# Patient Record
Sex: Female | Born: 1935 | Race: White | Hispanic: No | Smoking: Never smoker
Health system: Southern US, Community
[De-identification: ages and names within clinical notes are randomized; demographics above are authoritative.]

## PROBLEM LIST (undated history)

## (undated) DIAGNOSIS — R238 Other skin changes: Secondary | ICD-10-CM

## (undated) DIAGNOSIS — R197 Diarrhea, unspecified: Secondary | ICD-10-CM

## (undated) DIAGNOSIS — E46 Unspecified protein-calorie malnutrition: Secondary | ICD-10-CM

## (undated) DIAGNOSIS — R630 Anorexia: Secondary | ICD-10-CM

## (undated) DIAGNOSIS — R233 Spontaneous ecchymoses: Secondary | ICD-10-CM

## (undated) DIAGNOSIS — I480 Paroxysmal atrial fibrillation: Secondary | ICD-10-CM

## (undated) DIAGNOSIS — G7241 Inclusion body myositis [IBM]: Secondary | ICD-10-CM

## (undated) DIAGNOSIS — E039 Hypothyroidism, unspecified: Secondary | ICD-10-CM

## (undated) DIAGNOSIS — IMO0002 Reserved for concepts with insufficient information to code with codable children: Secondary | ICD-10-CM

## (undated) DIAGNOSIS — M1990 Unspecified osteoarthritis, unspecified site: Secondary | ICD-10-CM

## (undated) DIAGNOSIS — N390 Urinary tract infection, site not specified: Secondary | ICD-10-CM

## (undated) DIAGNOSIS — R51 Headache: Secondary | ICD-10-CM

## (undated) DIAGNOSIS — I951 Orthostatic hypotension: Secondary | ICD-10-CM

## (undated) DIAGNOSIS — D7589 Other specified diseases of blood and blood-forming organs: Secondary | ICD-10-CM

## (undated) DIAGNOSIS — Z8659 Personal history of other mental and behavioral disorders: Secondary | ICD-10-CM

## (undated) HISTORY — PX: TOTAL HIP ARTHROPLASTY: SHX124

## (undated) HISTORY — DX: Other skin changes: R23.8

## (undated) HISTORY — DX: Inclusion body myositis (IBM): G72.41

## (undated) HISTORY — DX: Personal history of other mental and behavioral disorders: Z86.59

## (undated) HISTORY — DX: Urinary tract infection, site not specified: N39.0

## (undated) HISTORY — DX: Hypothyroidism, unspecified: E03.9

## (undated) HISTORY — PX: PARTIAL HYSTERECTOMY: SHX80

## (undated) HISTORY — DX: Unspecified osteoarthritis, unspecified site: M19.90

## (undated) HISTORY — DX: Orthostatic hypotension: I95.1

## (undated) HISTORY — DX: Headache: R51

## (undated) HISTORY — DX: Anorexia: R63.0

## (undated) HISTORY — DX: Spontaneous ecchymoses: R23.3

---

## 1994-07-08 HISTORY — PX: OTHER SURGICAL HISTORY: SHX169

## 1998-12-28 ENCOUNTER — Encounter: Payer: Self-pay | Admitting: General Surgery

## 1998-12-28 ENCOUNTER — Ambulatory Visit (HOSPITAL_COMMUNITY): Admission: RE | Admit: 1998-12-28 | Discharge: 1998-12-28 | Payer: Self-pay | Admitting: General Surgery

## 1999-10-23 ENCOUNTER — Ambulatory Visit: Admission: RE | Admit: 1999-10-23 | Discharge: 1999-10-23 | Payer: Self-pay | Admitting: Orthopedic Surgery

## 1999-10-24 ENCOUNTER — Encounter: Payer: Self-pay | Admitting: Orthopedic Surgery

## 1999-11-09 ENCOUNTER — Ambulatory Visit (HOSPITAL_COMMUNITY): Admission: RE | Admit: 1999-11-09 | Discharge: 1999-11-09 | Payer: Self-pay | Admitting: Family Medicine

## 1999-12-07 ENCOUNTER — Inpatient Hospital Stay (HOSPITAL_COMMUNITY): Admission: RE | Admit: 1999-12-07 | Discharge: 1999-12-12 | Payer: Self-pay | Admitting: Orthopedic Surgery

## 1999-12-07 ENCOUNTER — Encounter: Payer: Self-pay | Admitting: Orthopedic Surgery

## 2000-05-28 ENCOUNTER — Encounter: Admission: RE | Admit: 2000-05-28 | Discharge: 2000-05-28 | Payer: Self-pay

## 2000-06-05 ENCOUNTER — Other Ambulatory Visit: Admission: RE | Admit: 2000-06-05 | Discharge: 2000-06-05 | Payer: Self-pay | Admitting: Obstetrics & Gynecology

## 2001-06-25 ENCOUNTER — Other Ambulatory Visit: Admission: RE | Admit: 2001-06-25 | Discharge: 2001-06-25 | Payer: Self-pay | Admitting: Obstetrics & Gynecology

## 2001-08-21 ENCOUNTER — Encounter: Payer: Self-pay | Admitting: Gastroenterology

## 2001-08-21 ENCOUNTER — Ambulatory Visit (HOSPITAL_COMMUNITY): Admission: RE | Admit: 2001-08-21 | Discharge: 2001-08-21 | Payer: Self-pay | Admitting: Gastroenterology

## 2001-09-01 ENCOUNTER — Encounter (INDEPENDENT_AMBULATORY_CARE_PROVIDER_SITE_OTHER): Payer: Self-pay | Admitting: Specialist

## 2001-09-01 ENCOUNTER — Ambulatory Visit (HOSPITAL_COMMUNITY): Admission: RE | Admit: 2001-09-01 | Discharge: 2001-09-01 | Payer: Self-pay | Admitting: Gastroenterology

## 2001-11-17 ENCOUNTER — Encounter: Admission: RE | Admit: 2001-11-17 | Discharge: 2001-11-17 | Payer: Self-pay | Admitting: Gastroenterology

## 2001-11-17 ENCOUNTER — Encounter: Payer: Self-pay | Admitting: Gastroenterology

## 2002-05-13 ENCOUNTER — Encounter: Admission: RE | Admit: 2002-05-13 | Discharge: 2002-05-13 | Payer: Self-pay | Admitting: Internal Medicine

## 2002-05-13 ENCOUNTER — Encounter: Payer: Self-pay | Admitting: Internal Medicine

## 2002-07-23 ENCOUNTER — Other Ambulatory Visit: Admission: RE | Admit: 2002-07-23 | Discharge: 2002-07-23 | Payer: Self-pay | Admitting: Obstetrics & Gynecology

## 2002-08-17 ENCOUNTER — Ambulatory Visit (HOSPITAL_COMMUNITY): Admission: RE | Admit: 2002-08-17 | Discharge: 2002-08-17 | Payer: Self-pay | Admitting: Obstetrics & Gynecology

## 2002-08-17 ENCOUNTER — Encounter: Payer: Self-pay | Admitting: Obstetrics & Gynecology

## 2002-09-01 ENCOUNTER — Observation Stay (HOSPITAL_COMMUNITY): Admission: RE | Admit: 2002-09-01 | Discharge: 2002-09-02 | Payer: Self-pay | Admitting: Obstetrics & Gynecology

## 2002-09-01 ENCOUNTER — Encounter (INDEPENDENT_AMBULATORY_CARE_PROVIDER_SITE_OTHER): Payer: Self-pay

## 2002-09-01 ENCOUNTER — Encounter (INDEPENDENT_AMBULATORY_CARE_PROVIDER_SITE_OTHER): Payer: Self-pay | Admitting: Specialist

## 2002-09-30 ENCOUNTER — Other Ambulatory Visit: Admission: RE | Admit: 2002-09-30 | Discharge: 2002-09-30 | Payer: Self-pay | Admitting: Obstetrics & Gynecology

## 2005-03-19 ENCOUNTER — Encounter (HOSPITAL_COMMUNITY): Admission: RE | Admit: 2005-03-19 | Discharge: 2005-04-05 | Payer: Self-pay | Admitting: Internal Medicine

## 2005-04-15 ENCOUNTER — Ambulatory Visit (HOSPITAL_COMMUNITY): Admission: RE | Admit: 2005-04-15 | Discharge: 2005-04-15 | Payer: Self-pay | Admitting: Internal Medicine

## 2005-04-15 ENCOUNTER — Encounter (HOSPITAL_COMMUNITY): Admission: RE | Admit: 2005-04-15 | Discharge: 2005-05-15 | Payer: Self-pay | Admitting: Internal Medicine

## 2005-05-10 ENCOUNTER — Encounter: Admission: RE | Admit: 2005-05-10 | Discharge: 2005-05-10 | Payer: Self-pay | Admitting: Internal Medicine

## 2005-12-18 ENCOUNTER — Encounter: Admission: RE | Admit: 2005-12-18 | Discharge: 2005-12-18 | Payer: Self-pay | Admitting: Family Medicine

## 2006-07-21 ENCOUNTER — Encounter: Admission: RE | Admit: 2006-07-21 | Discharge: 2006-07-21 | Payer: Self-pay | Admitting: Family Medicine

## 2007-02-06 ENCOUNTER — Encounter: Admission: RE | Admit: 2007-02-06 | Discharge: 2007-02-06 | Payer: Self-pay | Admitting: Family Medicine

## 2007-09-21 ENCOUNTER — Encounter: Admission: RE | Admit: 2007-09-21 | Discharge: 2007-09-21 | Payer: Self-pay | Admitting: Family Medicine

## 2007-09-26 ENCOUNTER — Encounter: Admission: RE | Admit: 2007-09-26 | Discharge: 2007-09-26 | Payer: Self-pay

## 2007-10-01 ENCOUNTER — Encounter: Admission: RE | Admit: 2007-10-01 | Discharge: 2007-10-01 | Payer: Self-pay | Admitting: Family Medicine

## 2008-03-31 ENCOUNTER — Encounter: Admission: RE | Admit: 2008-03-31 | Discharge: 2008-03-31 | Payer: Self-pay | Admitting: General Surgery

## 2008-04-01 ENCOUNTER — Encounter: Admission: RE | Admit: 2008-04-01 | Discharge: 2008-04-01 | Payer: Self-pay | Admitting: General Surgery

## 2008-04-04 ENCOUNTER — Encounter (INDEPENDENT_AMBULATORY_CARE_PROVIDER_SITE_OTHER): Payer: Self-pay | Admitting: General Surgery

## 2008-04-04 ENCOUNTER — Ambulatory Visit (HOSPITAL_BASED_OUTPATIENT_CLINIC_OR_DEPARTMENT_OTHER): Admission: RE | Admit: 2008-04-04 | Discharge: 2008-04-04 | Payer: Self-pay | Admitting: General Surgery

## 2008-06-17 ENCOUNTER — Encounter: Admission: RE | Admit: 2008-06-17 | Discharge: 2008-07-07 | Payer: Self-pay | Admitting: Internal Medicine

## 2008-06-20 ENCOUNTER — Encounter: Admission: RE | Admit: 2008-06-20 | Discharge: 2008-07-07 | Payer: Self-pay | Admitting: Internal Medicine

## 2009-03-21 ENCOUNTER — Encounter: Admission: RE | Admit: 2009-03-21 | Discharge: 2009-03-21 | Payer: Self-pay | Admitting: Family Medicine

## 2010-04-19 ENCOUNTER — Encounter: Admission: RE | Admit: 2010-04-19 | Discharge: 2010-04-19 | Payer: Self-pay | Admitting: Family Medicine

## 2010-07-04 ENCOUNTER — Inpatient Hospital Stay (HOSPITAL_COMMUNITY)
Admission: EM | Admit: 2010-07-04 | Discharge: 2010-07-13 | Disposition: A | Discharge status: Other Acute Inpatient Hospital | Payer: Self-pay | Source: Home / Self Care | Attending: Internal Medicine | Admitting: Internal Medicine

## 2010-07-07 ENCOUNTER — Encounter: Payer: Self-pay | Admitting: Internal Medicine

## 2010-07-08 HISTORY — PX: TRANSTHORACIC ECHOCARDIOGRAM: SHX275

## 2010-07-11 LAB — GLUCOSE, CAPILLARY
Glucose-Capillary: 109 mg/dL — ABNORMAL HIGH (ref 70–99)
Glucose-Capillary: 116 mg/dL — ABNORMAL HIGH (ref 70–99)
Glucose-Capillary: 124 mg/dL — ABNORMAL HIGH (ref 70–99)
Glucose-Capillary: 137 mg/dL — ABNORMAL HIGH (ref 70–99)
Glucose-Capillary: 81 mg/dL (ref 70–99)

## 2010-07-11 LAB — BASIC METABOLIC PANEL
BUN: 14 mg/dL (ref 6–23)
CO2: 26 mEq/L (ref 19–32)
Calcium: 9 mg/dL (ref 8.4–10.5)
Chloride: 109 mEq/L (ref 96–112)
Creatinine, Ser: 0.79 mg/dL (ref 0.4–1.2)
GFR calc Af Amer: >60 mL/min (ref >60)
GFR calc non Af Amer: >60 mL/min (ref >60)
Glucose, Bld: 88 mg/dL (ref 70–99)
Potassium: 3.5 mEq/L (ref 3.5–5.1)
Sodium: 141 mEq/L (ref 135–145)

## 2010-07-28 ENCOUNTER — Emergency Department (HOSPITAL_COMMUNITY)
Admission: EM | Admit: 2010-07-28 | Discharge: 2010-07-28 | Payer: Self-pay | Source: Home / Self Care | Admitting: Emergency Medicine

## 2010-07-28 ENCOUNTER — Encounter: Payer: Self-pay | Admitting: Internal Medicine

## 2010-07-28 ENCOUNTER — Encounter: Payer: Self-pay | Admitting: Family Medicine

## 2010-07-29 ENCOUNTER — Encounter: Payer: Self-pay | Admitting: Family Medicine

## 2010-07-31 LAB — POCT I-STAT, CHEM 8
BUN: 34 mg/dL — ABNORMAL HIGH (ref 6–23)
Hemoglobin: 10.2 g/dL — ABNORMAL LOW (ref 12.0–15.0)
TCO2: 28 mmol/L (ref 0–100)

## 2010-07-31 LAB — CBC
Hemoglobin: 10.1 g/dL — ABNORMAL LOW (ref 12.0–15.0)
MCH: 31.7 pg (ref 26.0–34.0)
MCV: 95.6 fL (ref 78.0–100.0)
RBC: 3.19 MIL/uL — ABNORMAL LOW (ref 3.87–5.11)
RDW: 13.1 % (ref 11.5–15.5)
WBC: 6.4 10*3/uL (ref 4.0–10.5)

## 2010-07-31 LAB — PROTIME-INR: Prothrombin Time: 13.1 seconds (ref 11.6–15.2)

## 2010-07-31 LAB — APTT: aPTT: 30 seconds (ref 24–37)

## 2010-08-09 NOTE — Procedures (Signed)
Summary: Upper Endoscopy w/DIL  Patient: [name] Note: All result statuses are Final unless otherwise noted.  Tests: (1) Upper Endoscopy w/DIL (UED)  UED Upper Endoscopy w/DIL                             DONE     [facility]     [address] [place], [place]  [address]           ENDOSCOPY PROCEDURE REPORT           PATIENT:  [name], [name]  MR#:  [id number]     BIRTHDATE:  Dec 03, 1925, 84 yrs. old  GENDER:  female           ENDOSCOPIST:  Hedwig Morton. Juanda Chance, MD     ASSISTANT:           PROCEDURE DATE:  07/07/2010     PROCEDURE:  EGD with dilatation over guidewire     ASA CLASS:  Class III     INDICATIONS:  1) dysphagia abnormal Ba esophagram, 12.5 mm tablet     retained at g-e junction, also dismotility           MEDICATIONS:   Versed 3 mg, Fentanyl 37.5 mcg     TOPICAL ANESTHETIC:  Cetacaine Spray           DESCRIPTION OF PROCEDURE:   After the risks benefits and     alternatives of the procedure were thoroughly explained, informed     consent was obtained.  The Pentax EGD [id number] endoscope was     introduced through the mouth and advanced to the second portion of     the duodenum, without limitations.  The instrument was slowly     withdrawn as the mucosa was carefully examined.     <<PROCEDUREIMAGES>>           A hiatal hernia was found (see image5, image6, image7, and     image4). 3 cm hiatal hernia 37-40cm  Presbyesophagus was found     (see image10 and image11).  The examination was otherwise normal.     Savary dilation over a guidewire was performed (see image9,     image2, and image1). 18 mm Savary dil passes through diaphragmatic     ring without difficulty    Dilation was then performed at the     distal esophagus           1) Dilator:  Savary over guidewire  Size(s):     Resistance:    Heme:     Appearance:           COMPLICATIONS:  None           ENDOSCOPIC IMPRESSION:     1) Hiatal hernia     2) Presbyesophagus     3) Otherwise normal  examination.     no stricture, esophageal dismotility and a spasm at the     diaphragmatic hiatus     s/p passage of 18 mm dilator     RECOMMENDATIONS:     resume diet     antireflux measures     antacids prn           REPEAT EXAM:  In 0 year(s) for.           ______________________________     Hedwig Morton. Juanda Chance, MD           CC:  n.     eSIGNED:   Hedwig Morton. Brodie at 07/07/2010 10:34 AM           Page 2 of 3   [place], [place], [id number]  Note: An exclamation mark (!) indicates a result that was not dispersed into the flowsheet. Document Creation Date: 07/07/2010 10:35 AM _______________________________________________________________________  (1) Order result status: Final Collection or observation date-time: 07/07/2010 10:21 Requested date-time:  Receipt date-time:  Reported date-time:  Referring Physician:   Ordering Physician: Lina Sar [phone]) Specimen Source:  Source: Launa Grill Order Number: [id number] Lab site:

## 2010-08-10 ENCOUNTER — Ambulatory Visit (INDEPENDENT_AMBULATORY_CARE_PROVIDER_SITE_OTHER): Payer: Medicare Other | Admitting: *Deleted

## 2010-08-10 DIAGNOSIS — I4891 Unspecified atrial fibrillation: Secondary | ICD-10-CM

## 2010-08-10 DIAGNOSIS — E039 Hypothyroidism, unspecified: Secondary | ICD-10-CM

## 2010-08-10 DIAGNOSIS — D649 Anemia, unspecified: Secondary | ICD-10-CM

## 2010-09-17 LAB — COMPREHENSIVE METABOLIC PANEL
ALT: 10 U/L (ref 0–35)
AST: 22 U/L (ref 0–37)
Albumin: 3.2 g/dL — ABNORMAL LOW (ref 3.5–5.2)
Alkaline Phosphatase: 47 U/L (ref 39–117)
Chloride: 100 mEq/L (ref 96–112)
Creatinine, Ser: 1 mg/dL (ref 0.4–1.2)
GFR calc Af Amer: >60 mL/min (ref >60)
Potassium: 3.2 mEq/L — ABNORMAL LOW (ref 3.5–5.1)
Sodium: 136 mEq/L (ref 135–145)
Total Bilirubin: 0.6 mg/dL (ref 0.3–1.2)

## 2010-09-17 LAB — CBC
HCT: 29.8 % — ABNORMAL LOW (ref 36.0–46.0)
Hemoglobin: 9.9 g/dL — ABNORMAL LOW (ref 12.0–15.0)
MCH: 32.9 pg (ref 26.0–34.0)
MCHC: 33.6 g/dL (ref 30.0–36.0)
MCV: 98 fL (ref 78.0–100.0)
Platelets: 166 10*3/uL (ref 150–400)
Platelets: 199 10*3/uL (ref 150–400)
Platelets: 224 10*3/uL (ref 150–400)
RBC: 2.95 MIL/uL — ABNORMAL LOW (ref 3.87–5.11)
RBC: 3.04 MIL/uL — ABNORMAL LOW (ref 3.87–5.11)
RBC: 3.68 MIL/uL — ABNORMAL LOW (ref 3.87–5.11)
RBC: 4.02 MIL/uL (ref 3.87–5.11)
RDW: 15.7 % — ABNORMAL HIGH (ref 11.5–15.5)
RDW: 16.1 % — ABNORMAL HIGH (ref 11.5–15.5)
WBC: 3.6 10*3/uL — ABNORMAL LOW (ref 4.0–10.5)
WBC: 3.7 10*3/uL — ABNORMAL LOW (ref 4.0–10.5)

## 2010-09-17 LAB — GLUCOSE, CAPILLARY
Glucose-Capillary: 106 mg/dL — ABNORMAL HIGH (ref 70–99)
Glucose-Capillary: 109 mg/dL — ABNORMAL HIGH (ref 70–99)
Glucose-Capillary: 166 mg/dL — ABNORMAL HIGH (ref 70–99)
Glucose-Capillary: 81 mg/dL (ref 70–99)
Glucose-Capillary: 83 mg/dL (ref 70–99)
Glucose-Capillary: 84 mg/dL (ref 70–99)
Glucose-Capillary: 86 mg/dL (ref 70–99)
Glucose-Capillary: 91 mg/dL (ref 70–99)
Glucose-Capillary: 94 mg/dL (ref 70–99)
Glucose-Capillary: 94 mg/dL (ref 70–99)
Glucose-Capillary: 94 mg/dL (ref 70–99)
Glucose-Capillary: 97 mg/dL (ref 70–99)

## 2010-09-17 LAB — CARDIAC PANEL(CRET KIN+CKTOT+MB+TROPI)
CK, MB: 4.4 ng/mL — ABNORMAL HIGH (ref 0.3–4.0)
CK, MB: 5 ng/mL — ABNORMAL HIGH (ref 0.3–4.0)
Relative Index: 3 — ABNORMAL HIGH (ref 0.0–2.5)
Relative Index: 3 — ABNORMAL HIGH (ref 0.0–2.5)
Relative Index: 3 — ABNORMAL HIGH (ref 0.0–2.5)
Total CK: 142 U/L (ref 7–177)
Total CK: 167 U/L (ref 7–177)
Troponin I: 0.03 ng/mL (ref 0.00–0.06)
Troponin I: 0.03 ng/mL (ref 0.00–0.06)

## 2010-09-17 LAB — LIPID PANEL
Cholesterol: 415 mg/dL — ABNORMAL HIGH (ref 0–200)
HDL: 72 mg/dL (ref >39)
Total CHOL/HDL Ratio: 5.8 RATIO

## 2010-09-17 LAB — URINALYSIS, ROUTINE W REFLEX MICROSCOPIC
Bilirubin Urine: NEGATIVE
Glucose, UA: NEGATIVE mg/dL
Nitrite: POSITIVE — AB
Protein, ur: NEGATIVE mg/dL
Urobilinogen, UA: 1 mg/dL (ref 0.0–1.0)
pH: 7 (ref 5.0–8.0)

## 2010-09-17 LAB — DIFFERENTIAL
Basophils Absolute: 0 10*3/uL (ref 0.0–0.1)
Basophils Relative: 1 % (ref 0–1)
Eosinophils Absolute: 0.1 10*3/uL (ref 0.0–0.7)
Neutro Abs: 4.2 10*3/uL (ref 1.7–7.7)

## 2010-09-17 LAB — BASIC METABOLIC PANEL
BUN: 11 mg/dL (ref 6–23)
BUN: 12 mg/dL (ref 6–23)
BUN: 13 mg/dL (ref 6–23)
BUN: 8 mg/dL (ref 6–23)
CO2: 22 mEq/L (ref 19–32)
CO2: 24 mEq/L (ref 19–32)
CO2: 26 mEq/L (ref 19–32)
CO2: 29 mEq/L (ref 19–32)
Calcium: 8.4 mg/dL (ref 8.4–10.5)
Calcium: 8.7 mg/dL (ref 8.4–10.5)
Calcium: 9 mg/dL (ref 8.4–10.5)
Chloride: 100 mEq/L (ref 96–112)
Chloride: 108 mEq/L (ref 96–112)
Creatinine, Ser: 0.86 mg/dL (ref 0.4–1.2)
Creatinine, Ser: 0.95 mg/dL (ref 0.4–1.2)
GFR calc Af Amer: 54 mL/min — ABNORMAL LOW (ref >60)
GFR calc Af Amer: >60 mL/min (ref >60)
GFR calc Af Amer: >60 mL/min (ref >60)
GFR calc non Af Amer: 51 mL/min — ABNORMAL LOW (ref >60)
GFR calc non Af Amer: >60 mL/min (ref >60)
GFR calc non Af Amer: >60 mL/min (ref >60)
Glucose, Bld: 75 mg/dL (ref 70–99)
Glucose, Bld: 81 mg/dL (ref 70–99)
Glucose, Bld: 86 mg/dL (ref 70–99)
Potassium: 3.2 mEq/L — ABNORMAL LOW (ref 3.5–5.1)
Potassium: 3.6 mEq/L (ref 3.5–5.1)
Sodium: 138 mEq/L (ref 135–145)
Sodium: 143 mEq/L (ref 135–145)
Sodium: 144 mEq/L (ref 135–145)

## 2010-09-17 LAB — URINE MICROSCOPIC-ADD ON

## 2010-09-17 LAB — URINE CULTURE: Culture: NO GROWTH

## 2010-09-17 LAB — T3, FREE: T3, Free: 0.6 pg/mL — ABNORMAL LOW (ref 2.3–4.2)

## 2010-09-17 LAB — T4: T4, Total: 4.3 ug/dL — ABNORMAL LOW (ref 5.0–12.5)

## 2010-09-17 LAB — MAGNESIUM: Magnesium: 1.8 mg/dL (ref 1.5–2.5)

## 2010-11-02 ENCOUNTER — Emergency Department (HOSPITAL_COMMUNITY): Payer: Medicare Other

## 2010-11-02 ENCOUNTER — Observation Stay (HOSPITAL_COMMUNITY)
Admission: EM | Admit: 2010-11-02 | Discharge: 2010-11-03 | Disposition: A | Discharge status: Home / Self Care | Payer: Medicare Other | Attending: Specialist | Admitting: Specialist

## 2010-11-02 DIAGNOSIS — IMO0001 Reserved for inherently not codable concepts without codable children: Secondary | ICD-10-CM

## 2010-11-02 DIAGNOSIS — E039 Hypothyroidism, unspecified: Secondary | ICD-10-CM | POA: Insufficient documentation

## 2010-11-02 DIAGNOSIS — Z79899 Other long term (current) drug therapy: Secondary | ICD-10-CM | POA: Insufficient documentation

## 2010-11-02 DIAGNOSIS — T84029A Dislocation of unspecified internal joint prosthesis, initial encounter: Principal | ICD-10-CM | POA: Insufficient documentation

## 2010-11-02 DIAGNOSIS — W19XXXA Unspecified fall, initial encounter: Secondary | ICD-10-CM | POA: Insufficient documentation

## 2010-11-02 DIAGNOSIS — Z96649 Presence of unspecified artificial hip joint: Secondary | ICD-10-CM | POA: Insufficient documentation

## 2010-11-02 DIAGNOSIS — M47812 Spondylosis without myelopathy or radiculopathy, cervical region: Secondary | ICD-10-CM | POA: Insufficient documentation

## 2010-11-02 DIAGNOSIS — G319 Degenerative disease of nervous system, unspecified: Secondary | ICD-10-CM | POA: Insufficient documentation

## 2010-11-02 DIAGNOSIS — I4891 Unspecified atrial fibrillation: Secondary | ICD-10-CM | POA: Insufficient documentation

## 2010-11-02 DIAGNOSIS — M5030 Other cervical disc degeneration, unspecified cervical region: Secondary | ICD-10-CM | POA: Insufficient documentation

## 2010-11-02 DIAGNOSIS — S7000XA Contusion of unspecified hip, initial encounter: Secondary | ICD-10-CM | POA: Insufficient documentation

## 2010-11-02 DIAGNOSIS — Z90710 Acquired absence of both cervix and uterus: Secondary | ICD-10-CM | POA: Insufficient documentation

## 2010-11-02 DIAGNOSIS — M129 Arthropathy, unspecified: Secondary | ICD-10-CM | POA: Insufficient documentation

## 2010-11-02 LAB — CBC
Platelets: 194 10*3/uL (ref 150–400)
RDW: 14.1 % (ref 11.5–15.5)
WBC: 9.1 10*3/uL (ref 4.0–10.5)

## 2010-11-02 LAB — DIFFERENTIAL
Basophils Absolute: 0 10*3/uL (ref 0.0–0.1)
Basophils Relative: 0 % (ref 0–1)
Eosinophils Absolute: 0.1 10*3/uL (ref 0.0–0.7)
Eosinophils Relative: 1 % (ref 0–5)

## 2010-11-02 LAB — BASIC METABOLIC PANEL
BUN: 22 mg/dL (ref 6–23)
CO2: 25 mEq/L (ref 19–32)
Calcium: 9.2 mg/dL (ref 8.4–10.5)
GFR calc non Af Amer: >60 mL/min (ref >60)
Glucose, Bld: 99 mg/dL (ref 70–99)

## 2010-11-02 LAB — URINALYSIS, ROUTINE W REFLEX MICROSCOPIC
Ketones, ur: NEGATIVE mg/dL
Nitrite: NEGATIVE
Protein, ur: NEGATIVE mg/dL
Urobilinogen, UA: 1 mg/dL (ref 0.0–1.0)
pH: 6.5 (ref 5.0–8.0)

## 2010-11-03 ENCOUNTER — Emergency Department (HOSPITAL_COMMUNITY): Payer: Medicare Other

## 2010-11-03 LAB — TYPE AND SCREEN
ABO/RH(D): O POS
Antibody Screen: NEGATIVE

## 2010-11-03 LAB — ABO/RH: ABO/RH(D): O POS

## 2010-11-20 NOTE — Op Note (Signed)
[name], [name]                ACCOUNT NO.:  [id number]   MEDICAL RECORD NO.:  [id number]          PATIENT TYPE:  AMB   LOCATION:  DSC                          FACILITY:  [facility]   PHYSICIAN:  Angelia Mould. Derrell Lolling, M.D.DATE OF BIRTH:  Apr 09, 1926   DATE OF PROCEDURE:  04/04/2008  DATE OF DISCHARGE:                               OPERATIVE REPORT   PREOPERATIVE DIAGNOSIS:  Myositis.   POSTOPERATIVE DIAGNOSIS:  Myositis.   OPERATION PERFORMED:  Right biceps muscle biopsy x3.   SURGEON:  Angelia Mould. Derrell Lolling, MD   OPERATIVE TECHNIQUE:  The patient was brought to [facility], into the operating room.  A general LMA anesthetic was induced.  The right arm was prepped and draped in the sterile fashion.  The  patient was identified as correct patient, correct procedure, and  correct site.  Marcaine 0.5% with epinephrine was used as local  infiltration anesthetic in the skin and subcutaneous tissue only.  A  longitudinal incision was made overlying the right biceps muscle.  Dissection was carried down through the subcutaneous tissue.  We incised  the muscle fascia and placed self-retaining retractors.  This exposed  the biceps muscle nicely.  Using hemostats, I isolated 3 separate muscle  bundles proximally and distally.  I tried to get almost 3 cm length of  muscle and I amputated the muscle bundles, placed it on tongue blade,  stapled into the tongue blade, wrapped in a moistened-saline gauze.  After I had done all 3 of these, they were sent to the lab with the  request for routine histology as well as electron microscopy at [facility].  The cut ends of the muscle were ligated with 2-0 Vicryl ties.  The wound was irrigated with saline.  Hemostasis was excellent.  The  subcutaneous tissue was closed with running suture of 3-0 Vicryl and the  skin was closed with a running subcuticular suture of 4-0 Monocryl and  Dermabond.  Clean bandage and Ace wrap were placed.  The  patient  tolerated the procedure well and was taken to the recovery room in  stable condition.  Estimated blood loss was about 10-20 mL.  Complications none.  Sponge, needle, and instrument counts were correct.      Angelia Mould. Derrell Lolling, M.D.  Electronically Signed     HMI/MEDQ  D:  04/04/2008  T:  04/05/2008  Job:  [id number]   cc:   Areatha Keas, M.D.  Marlan Palau, M.D.

## 2010-11-23 NOTE — Op Note (Signed)
NAME:  [name], [name]                          ACCOUNT NO.:  [id number]   MEDICAL RECORD NO.:  [id number]                   PATIENT TYPE:  OBV   LOCATION:  9102                                 FACILITY:  [facility]   PHYSICIAN:  Freddy Finner, M.D.                DATE OF BIRTH:  May 05, 1926   DATE OF PROCEDURE:  09/01/2002  DATE OF DISCHARGE:                                 OPERATIVE REPORT   PREOPERATIVE DIAGNOSES:  1. Right ovarian cyst in a postmenopausal female.  2. Long-standing enlargement of ovaries for age which has been stable on     serial ultrasound.  3. Negative CA-125 and otherwise negative CT scan of abdomen and pelvis     preoperatively.  4. Chronic pelvic pain with additional gastrointestinal work-up to date     being negative except for the presence of a colonic polyp which was     removed surgically.   OPERATIVE PROCEDURE:  1. Laparoscopy.  2. Bilateral salpingo-oophorectomy by laparoscopic removal.   ANESTHESIA:  General.   ESTIMATED INTRAOPERATIVE BLOOD LOSS:  Less than 10 mL.   INTRAOPERATIVE COMPLICATIONS:  None.   INTRAOPERATIVE FINDINGS:  Recorded in still photographs retained in the  office record.   PROCEDURE:  The patient was admitted on the morning of surgery.  She was  placed in PAS hose.  She was brought to the operating room, placed under  adequate general endotracheal anesthesia.  Placed in dorsal lithotomy  position using the Allen stirrups system.  Betadine prep of abdomen was  carried out in the usual fashion.  Catheterized urine was obtained with  sterile technique to empty the bladder and to resubmit urinalysis which was  abnormal on admission.  Sterile drapes were applied.  Two small incisions  were made in the abdomen one at the umbilicus and one just above the  symphysis.  A 10-11 disposable nonbladed trocar was used at each location.  Through the upper incision the first trocar was placed while elevating the  anterior abdominal wall  manually.  Direct inspection revealed adequate  placement with no evidence of injury on entry.  Pneumoperitoneum was allowed  to accumulate with carbon dioxide gas.  Another 10-11 trocar was placed  through the lower incision without difficulty under direct visualization.  Systematic examination of abdominal and pelvic contents was carried out.  There was no apparent abnormality except the ovaries did appear to be large  for a postmenopausal female.  Ovaries were free and not adherent to any  structures.  There was no peritoneal seeding.  No significant amount of  intra-abdominal fluid.  Peritoneal washings were taken and submitted  separately.  The right tube and ovary was then elevated and using a ____  gliding grasping forceps.  Using the gyrus tripolar bipolar cutting forceps  through the operating channel of the laparoscope the infundibulopelvic  ligament and remaining attachments of the ovary were  coagulated and sharply  incised.  This completely freed the tube and ovary on the right.  A similar  process was then carried out on the left to completely free the left ovary.  Hemostasis was adequate in each location.  An attempt was then made to  retrieve the ovaries through the large trocar below using claw grasping  forceps through the 10-11 trocar sleeve.  The ovary was very firm, perhaps  consistent with a fibroma and required morcellation for complete removal.  It was elected to proceed with morcellation because of the essentially  completely negative work-up to this point in time and the desire to avoid a  larger incision in the abdomen to retrieve the ovaries.  Each ovary was  retrieved in this fashion and each was submitted separately for histologic  examination.  All visible fragments of ovary and tube were completely  removed from the abdomen.  Irrigation was then carried out with a Nezhat  irrigator aspirator and irrigating solution aspirated from the abdomen.  Complete  hemostasis was noted under irrigation and reduced intra-abdominal  pressure.  Gas was allowed to escape from the abdomen.  The trocar sleeves  were removed.  The incisions were then injected with 0.25% plain Marcaine  for postoperative analgesia.  Each site was closed with interrupted  subcuticular sutures of 3-0 Dexon.  Steri-Strips were applied to the lower  incision.  The patient was given 30 mg of Toradol IV on conclusion of the  procedure.  The plan is to admit her overnight as an observation patient  because of her age and the extensive dissection.  The patient was then  awakened and taken to recovery in good condition.                                               Freddy Finner, M.D.    WRN/MEDQ  D:  09/01/2002  T:  09/01/2002  Job:  [id number]

## 2010-11-23 NOTE — Discharge Summary (Signed)
[facility]  Patient:    [name], [name] Visit Number: [id number] MRN: [id number]          Service Type: SUR Location: 4W 0470 01 Attending Physician:  Loanne Drilling Dictated by:   Grayland Jack, P.A. Admit Date:  12/07/1999 Discharge Date: 12/12/1999   CC:         Tammy R. Stoy Scotland, M.D.             Nolon Nations, M.D., M.P.H.                           Discharge Summary  ADMITTING DIAGNOSES: 1. Osteoarthritis, right hip. 2. Hyperthyroidism. 3. Acute sinusitis, resolved.  DISCHARGE DIAGNOSES: 1. Status post right total hip arthroplasty. 2. Hyperthyroidism. 3. Acute sinusitis, resolved.  PROCEDURE:  This patient underwent a right total hip arthroplasty.  SURGEON:  Ollen Gross, M.D. ASSISTANT:  ______  ANESTHESIA:  General.  CONSULTATION:  Rande Brunt. Thomasena Edis, M.D., Physical Medicine and Rehabilitation.  BRIEF HISTORY:  This patient is a 77 year old female with longstanding history of right hip and groin pain.  It has had a severe impact on her activities of daily living and patient was actually using a wheelchair at times.  Her x-rays revealed severe osteoarthritis.  It was felt that at this time the patient would best benefit from surgical intervention.  The risks, benefits, and complications of the surgery was discussed with the patient in detail and she agreed to proceed.  She was previously scheduled for total hip replacement on October 31, 1999, however had a persistent sinusitis and surgery had to be postponed.  She was treated for this by Dr. Nelms Scotland and Dr. Corine Shelter and they have subsequently cleared her for surgery at this time.  HOSPITAL COURSE:  On December 07, 1999, the patient under the above stated procedure in which she tolerated well without complications.  Coumadin therapy per pharmacy protocol was initiated postoperatively for DVT prophylaxis. Patient was placed on touchdown weight bearing status and physical therapy  was initiated for total hip protocol.  Occupational therapy was initiated for assistance with ADLs.  Patient did have some postoperative anemia with decreased blood pressures.  Felt that she would benefit from 2 units of packed red blood cells.  Patient received 2 units of blood at which she tolerated well and responded to nicely.  Posttransfusion:  Patients hemoglobin was 11.4.  Patient was reevaluated for inpatient rehabilitation, however with her good progress with physical therapy it was felt that she would not benefit from inpatient rehab.  On postoperative day 5, patient was without complaints of pain, nausea, or vomiting.  On exam, she was afebrile and her vital signs are stable.  Her dressing was dry and the incision site was clean and dry with no erythema or discharge.  She was neurovascularly intact to the right lower extremity.  The calf was soft and nontender.  It was felt that the patient was stable for discharge to home at this time.  LABORATORY DATA:  Preoperative CBC revealed a slightly decreased white blood cell at 3.6.  Hemoglobin and hematocrit were well within normal limits. Postoperatively, patients hemoglobin did fall to 8.7 and hematocrit to 26.1. Patient was transfused with 2 units packed red blood cells and hemoglobin stabilized at 11.4 and hematocrit at 33.1.  PT and INR prior to admission were within normal limits.  Patient was on Coumadin therapy and the coagulations responded accordingly.  These were followed  by pharmacy, and prior to discharge, patient PT was 19.4 and INR was 2.2.  Routine chemistries remained within normal limits during patient hospital stay.  Preoperative urinalysis showed some trace leukocyte esterase, and many epithelial, 0-5 white blood cells, and few bacteria.  CONDITION OF DISCHARGE:  Improved and stable.  DISCHARGE PLAN:  The patient is being discharged to home.  She will require home health nursing for [employer] as well as home  health physical therapy for continued transfers ambulation and range of motion exercises.  Patient may shower ad lib enough to keep the incision site clean and dry.  She should replace the dry dressing daily.  She may resume home medications and home diet.  DISCHARGE MEDICATIONS: 1. Percocet 5 mg #40. 2. Robaxin 500 mg #30. 3. Phenergan 25 mg #6. 4. Coumadin per pharmacy protocol. 5. The patient may take Benadryl over-the-counter as needed for itching.  DISCHARGE INSTRUCTIONS:  The patient should return to the clinic in 10 days to follow-up with Dr. Lequita Halt.  Please call [phone] for an appointment. Dictated by:   Grayland Jack, P.A. Attending Physician:  Loanne Drilling DD:  12/19/99 TD:  12/24/99 Job: [id number]

## 2010-11-23 NOTE — Op Note (Signed)
[place]  Patient:    [name], [name]                     MRN: [id number] Proc. Date: 12/07/99 Adm. Date:  [id number] Attending:  Ollen Gross V                           Operative Report  PREOPERATIVE DIAGNOSIS:  Osteoarthritis, right hip.  POSTOPERATIVE DIAGNOSIS:  Osteoarthritis, right hip.  PROCEDURE:  Right total hip arthroplasty.  SURGEON:  Trudee Grip, M.D.  ASSISTANT:  Druscilla Brownie. Shela Nevin, P.A.  ANESTHESIA:  General.  ESTIMATED BLOOD LOSS:  500.  DRAINS:  Hemovac x 1.  COMPLICATIONS:  None.  CONDITION:  Stable, to recovery.  BRIEF CLINICAL NOTE:  The patient is a 77 year old female who has severe osteoarthritis of her right hip with bone-on-bone changes and has had pain refractory to non-operative management.  She presents now for a total hip arthroplasty.  PROCEDURE IN DETAIL:  After the successful administration of general anesthetic, patient was placed in the left lateral decubitus position with the right side up and held with the hip positioner.  The right lower extremity was isolated from the perineum with plastic drapes and prepped and draped in the usual sterile fashion.  A standard posterolateral incision was made in the skin and cut with a 10 blade through subcutaneous tissue to the level of the fascial lata, which was incised in line with the skin incision.  The short external rotators were isolated and elevated off the femur.  A capsulectomy was performed, hip dislocated and the center of the femoral head marked.  For preoperative templating, the trial prosthesis was placed along the femur and the center of the trial head corresponds to the center of her nature femoral head.  Osteotomy line is marked and osteotomy made with an oscillating saw. Femur is then retracted anteriorly, anterior capsule removed and then retractors placed to give acetabular exposure.  Acetabular reaming is then initiated with a  46 and coursing up to a 49.  A 15-mm Duraloc sector acetabular shell is impacted into her acetabulum in approximately 40 degrees of abduction and 25 degrees of forward flexion.  She did have very thin medial wall prior to impacting the cup.  Some of the cancellous reamings are placed medially to build up the medial bone stock. She had a great stable fit with the cup and then two dome screws are used to transfix it.  A trial 28-mm 10 degree liner is placed, with the high wall at the 11 oclock position.  Femoral preparation is then initiated, first with the canal finder, then with the axial reamers.  Axial reaming is performed up to 13.5 mm.  The proximal reaming is then performed up to an 18-D and the cone up to an 18-D large.  The cone is then impacted into the proximal femur to rest at the level of the osteotomy and then the trial prosthesis is placed, an 18 x 13 x 36 +8 neck and a 28 +0 head.  She is noted to have some soft tissue laxity and a +6 head is placed with much better soft tissue tension.  Hip is reduced and she has outstanding stability with full extension, full external rotation and at 70 degrees of flexion and 40 degrees of adduction, there is 90 degrees of internal rotation, then at 90 degrees of flexion, also has 90 degrees  of internal rotation.  At this point, the hip is dislocated, trial prosthesis removed and then the apex hole eliminator placed into the acetabular shell. The permanent 28-mm 10 degree liner with a +4 off-set is then impacted.  The high wall again is at the 11 oclock position.  On the femoral side, the 18-D large sleeve is impacted into the femur, then the 18 x 13 stem with a 36 +8 neck is placed.  The trial +6 head is placed and then reduction performed once again ______ , parameters and excellent soft tissue tension.  The permanent 28 +6 head is then placed onto the femoral neck and then reduction performed. Wound is then copiously irrigated with  antibiotic solution.  Short external rotators are reattached to the femur through drill holes.  The fascia lata and fascia of the gluteus maximus are closed over one limb of the Hemovac drain with interrupted #1 Vicryl.  Subcu is closed in two layers with interrupted 0, then 2-0 Vicryl.  Subcuticular is closed with a running 4-0 Monocryl.  Incision is clean and dry and Steri-Strips and a bulky sterile dressing are applied.  Patient is then awakened and transported to recovery in stable condition.DD:  12/07/99 TD:  12/11/99 Job: [id number]

## 2010-11-23 NOTE — H&P (Signed)
[facility]  Patient:    [name], [name]                      MRN: [id number] Adm. Date:  12/07/99 Attending:  Ollen Gross, M.D. Dictator:   Druscilla Brownie. Bettey Costa CCNolon Nations, M.D., M.P.H.             Tammy R. Santa Scotland, M.D.             Helene Kelp, M.D.                         History and Physical  DATE OF BIRTH:  Aug 08, 1925  CHIEF COMPLAINT:  "Pain in my right hip."  HISTORY OF PRESENT ILLNESS:  This 77 year old lady who had previously been scheduled for total hip replacement arthroplasty of the right hip on October 31, 1999, had a persistent sinusitis that developed into a rather profound sinusitis.  The patient has now been successfully treated by Dr. Spark Scotland and Dr. Corine Shelter.  According to the patient, they have cleared her for surgery.  The patient has continued with marked right hip pain.  Now has to use a cane just for around the house and a wheelchair for any other times.  She is very frustrated and freely admits depression due to this continuing pain.  She is really looking forward to this surgery.  She has not donated any blood for this procedure.  PAST MEDICAL HISTORY:  Please see H&P of April 25.  CURRENT MEDICATIONS:  1. Xanax 0.25 mg 1 b.i.d. p.r.n.  2. Meclizine 75 mg 1 t.i.d. p.r.n.  3. Synthroid 75 mcg q.d.  ALLERGIES:  FELDENE, MORPHINE, and CODEINE.  REVIEW OF SYSTEMS:  Essentially negative.  FAMILY AND SOCIAL HISTORY:  Please see previous H&P.  REVIEW OF SYSTEMS:  CNS:  No seizure disorder, paralysis, numbness, or double vision.  Respiratory:  No productive cough.  No hemoptysis.  No shortness of breath.  Cardiovascular:  No chest pain.  No angina.  No orthopnea. Gastrointestinal:  No nausea, vomiting, melena, or bloody stool. Genitourinary:  No discharge, dysuria, or hematuria.  Musculoskeletal:  She has pain with range of motion of the right hip.  PHYSICAL EXAMINATION:  GENERAL:   Alert, cooperative, friendly, 77 year old, white female seen seated in wheelchair.  She is accompanied by her daughter.  VITAL SIGNS:  Blood pressure 140/76, pulse 76, respirations 12.  HEENT:  Normocephalic.  PERRLA.  Oropharynx is clear.  Posterior pharynx free of exudate and is pink.  No erythema.  No pustules.  CHEST:  Clear to auscultation.  No rhonchi.  No rales.  No wheezes.  HEART:  Regular rate and rhythm.  No murmurs are heard.  ABDOMEN:  Soft and nontender.  Liver and spleen not felt.  GENITALIA, RECTAL, PELVIC, BREASTS:  Not done.  Not pertinent to present illness.  EXTREMITIES:  Right hip with painful range of motion as previously mentioned in H&P of April 25.  ADMISSION DIAGNOSES:  1. Severe osteoarthritis of the right hip.  2. Hypothyroidism.  3. Acute sinusitis, resolved.  PLAN:  The patient will undergo right total knee replacement arthroplasty. She has not donated any blood for this procedure.  Depending on her progress in the hospital after her total hip, she may be able to go home with physical therapy versus inpatient rehab.  We will see how she  progresses.  If she has any acute problem while in the hospital, we will certainly ask Doctors Clapham Scotland and Corine Shelter to follow along with [place]. DD:  11/29/99 TD:  11/29/99 Job: [id number]

## 2010-11-23 NOTE — H&P (Signed)
NAME:  [name], WITCHER NO.:  [id number]   MEDICAL RECORD NO.:  [id number]                   PATIENT TYPE:   LOCATION:                                       FACILITY:   PHYSICIAN:  Freddy Finner, M.D.                DATE OF BIRTH:   DATE OF ADMISSION:  DATE OF DISCHARGE:                                HISTORY & PHYSICAL   REASON FOR ADMISSION:  The patient is being admitted as a day surgery  patient for bilateral salingo-oophorectomy.   HISTORY OF PRESENT ILLNESS:  The patient is a 77 year old female who had  vaginal hysterectomy with anterior and posterior vaginal repair in 1995, for  vaginal vault prolapse.  The ovaries could not be adequately removed at that  time and were left in place.  Approximately two years prior to this  admission, the patient was found to have enlarged ovaries for age by pelvic  ultrasound, but has been followed conservatively since that time and just  had serial ultrasounds which have shown no significant change in the ovaries  until the fairly recent past when she presented complaining of pain and a  small cystic mass was noted in the right adnexa measuring 2.2 x 2.4 x 2.1 cm  with some internal septations.  CA-125 returned on the same day and was  negative.  A CT scan of the abdomen with contrast on August 17, 2002, was  essentially unremarkable for pelvic findings other than then cyst.  No  adenopathy was noted.  No ascites was noted.  CT of the abdomen was normal.  CA-125 did show 10.5 which is well within the normal range.  The patient has  had intermittent pelvic pain off and on for at least two years.  She is now  admitted for laparoscopic bilateral salingo-oophorectomy.   In addition to the above, the patient has had evaluated by Dr. Melvia Heaps  for abdominal discomfort including a colonoscopy which did show a noncolonic  polyp which was biopsied.  Upper endoscopy was also performed and was  normal.   PAST  MEDICAL HISTORY:  No known significant medical illnesses except for a  thyroid disorder for which she takes Synthroid.  The patient does complain  that she may have lupus and is followed by Dr. Phylliss Bob.  She does have  arthritis.   ALLERGIES:  MORPHINE.   PAST SURGICAL HISTORY:  1. Replacement of both hips with prostheses.  2. Previous breast biopsy.  3. T&A many years ago.  4. D&C at age 4.  5. Four vaginal births.  6. Hysterectomy.   MEDICATIONS:  1. Enalapril.  2. Protonix.  3. Alprazolam.   PHYSICAL EXAMINATION:  HEENT:  Grossly normal.  Thyroid gland is not  palpably enlarged.  VITAL SIGNS:  Blood pressure in the office was 124/90.  CHEST:  Clear to auscultation.  HEART:  Normal sinus rhythm  without murmur, rub or gallop.  PELVIC:  The patient has a third-degree cystocele and vaginal atrophy.  The  vaginal cuff is normal.  Bimanual and rectovaginal exams confirm these  findings.  There is some tenderness in the introitus, perhaps on the right.   IMPRESSION:  1. Right ovarian cyst.  2. Chronic pelvic pain.   PLAN:  Laparoscopically performed bilateral salingo-oophorectomy.                                               Freddy Finner, M.D.    WRN/MEDQ  D:  08/31/2002  T:  09/01/2002  Job:  [id number]

## 2010-12-04 ENCOUNTER — Encounter: Payer: Self-pay | Admitting: Cardiology

## 2010-12-05 ENCOUNTER — Encounter: Payer: Self-pay | Admitting: Cardiology

## 2010-12-05 ENCOUNTER — Ambulatory Visit (INDEPENDENT_AMBULATORY_CARE_PROVIDER_SITE_OTHER): Payer: Medicare Other | Admitting: Cardiology

## 2010-12-05 VITALS — BP 100/60 | HR 51 | Wt 110.0 lb

## 2010-12-05 DIAGNOSIS — I480 Paroxysmal atrial fibrillation: Secondary | ICD-10-CM

## 2010-12-05 DIAGNOSIS — E039 Hypothyroidism, unspecified: Secondary | ICD-10-CM

## 2010-12-05 DIAGNOSIS — I4891 Unspecified atrial fibrillation: Secondary | ICD-10-CM

## 2010-12-05 MED ORDER — METOPROLOL TARTRATE 25 MG PO TABS: ORAL_TABLET | ORAL | Status: DC

## 2010-12-05 NOTE — Consult Note (Signed)
  [name], LOPEZPEREZ NO.:  [id number]  MEDICAL RECORD NO.:  [id number]           PATIENT TYPE:  E  LOCATION:  [facility]                         FACILITY:  [facility]  PHYSICIAN:  Erasmo Leventhal, M.D.DATE OF BIRTH:  [birth year]  DATE OF CONSULTATION: DATE OF DISCHARGE:                                CONSULTATION   HISTORY OF PRESENT ILLNESS:  An 77 year old female who is status post bilateral total hip arthroplasty by Dr. Lequita Halt, was doing well. During night, when she was trying to get to bed slipped and fell, dislocated her right hip, unable to weightbear.  No loss of consciousness, no neck or chest pain.  She was brought to emergency room by EMS and seen by Dr.ER MD and found to have a dislocated right total hip, and I was called.  ALLERGIES:  CODEINE, MORPHINE, PIROXICAM.  CURRENT MEDICATIONS:  Alprazolam, digoxin, isosorbide, meclizine, metoprolol, Synthroid, and tramadol.  PAST MEDICAL HISTORY:  Consistent with arthritis, atrial fibrillation, hypothyroidism, inclusion body myositis, dysphasia, and paroxysmal atrial fibrillation.  SURGICAL HISTORY:  Hip replacement, bilateral hysterectomy.  SOCIAL HISTORY:  Lives with family.  Nonsmoker.  No alcohol, no tobacco.  Walks at home with a walker.  PHYSICAL EXAMINATION:  VITAL SIGNS:  Temperature 97.7 oral, blood pressure 140/61, pulse 70 and regular, respiration 18 and unlabored. GENERAL:  Awake, alert, and oriented to person, place, time, and circumstances. NECK:  Moves her neck easily on command, no complaints of pain at all. EXTREMITIES:  Upper extremities unremarkable.  Left lower extremity unremarkable.  Right lower extremity shortened and internally rotated. Foot, ankle, leg, knee unremarkable when she moves her right hip.  Right lower extremity, she has pain in hip and groin region.  Plain x-rays show superior dislocation of the right, total hip arthroplasty.  No obvious  fracture.  IMPRESSION: 1. Right dislocated, total hip arthroplasty. 2. History of arthritis. 3. Atrial fibrillation. 4. Hypothyroidism. 5. Inclusion body myositis. 6. Dysphasia.  RECOMMENDATIONS:  I have discussed with the patient and family need for closed reduction and they would like to proceed.  PLAN:  Once we get the CT scan of head and neck clearance, we will proceed with anesthesia and closed reduction under anesthesia.  They understand the possible outcome but not limited to infection, neurovascular damage, DVT, illnesses, damage to normal structures, damage to implants, possible irreducible dislocation, dislodging implants, and etc.  All questions were answered in detail and would proceed.          ______________________________ Erasmo Leventhal, M.D.     RAC/MEDQ  D:  11/03/2010  T:  11/03/2010  Job:  [id number]  Electronically Signed by Eugenia Mcalpine M.D. on 12/05/2010 01:01:26 PM

## 2010-12-05 NOTE — Progress Notes (Signed)
[name] Date of Birth:  08-13-1925 [facility] / [facility] [address]   [address] [place], [place]  [address] [phone]           Fax   [phone]  History of Present Illness: This pleasant 77 year old woman is seen for a scheduled followup office visit.  She has a past history of paroxysmal atrial fibrillation she was hospitalized in December 2011 with paroxysmal atrial fibrillation in the setting of severe hypothyroidism.  Her echocardiogram on 03/17/11 showed LVH with normal left ventricular systolic function and no significant valve abnormalities.Patient does not have any history of ischemic heart disease. She has been taking metoprolol 25 mg twice a day.  She complains of weakness and fatigue.  Current Outpatient Prescriptions  Medication Sig Dispense Refill  . Albuterol Sulfate (PROAIR HFA IN) Inhale into the lungs. Uses prn       . ALPRAZolam (XANAX) 0.25 MG tablet Take 0.25 mg by mouth at bedtime as needed.        Marland Kitchen aspirin 325 MG tablet Take 81 mg by mouth daily.       . digoxin (LANOXIN) 0.125 MG tablet Take 125 mcg by mouth daily.        . isosorbide dinitrate (ISORDIL) 10 MG tablet Take 10 mg by mouth 3 (three) times daily.        Marland Kitchen levothyroxine (SYNTHROID, LEVOTHROID) 100 MCG tablet Take 50 mcg by mouth daily.       . meclizine (ANTIVERT) 25 MG tablet Take 25 mg by mouth 3 (three) times daily as needed.        . metoprolol tartrate (LOPRESSOR) 25 MG tablet Take one half of metoprolol twice a day.      . traMADol (ULTRAM) 50 MG tablet Take 50 mg by mouth every 6 (six) hours as needed.        Marland Kitchen DISCONTD: metoprolol tartrate (LOPRESSOR) 25 MG tablet Take 25 mg by mouth 2 (two) times daily.        Marland Kitchen DISCONTD: hyoscyamine (LEVSIN) 0.125 MG/5ML ELIX Take 125 mcg by mouth 3 (three) times daily.        Marland Kitchen DISCONTD: liothyronine (CYTOMEL) 25 MCG tablet Take 25 mcg by mouth daily.          Allergies  Allergen Reactions  . Codeine   . Morphine And Related    . Piroxicam     Patient Active Problem List  Diagnoses  . PAF (paroxysmal atrial fibrillation)  . Hypothyroidism    History  Smoking status  . Never Smoker   Smokeless tobacco  . Not on file    History  Alcohol Use No    No family history on file.  Review of Systems: Constitutional: no fever chills diaphoresis or fatigue or change in weight.  Head and neck: no hearing loss, no epistaxis, no photophobia or visual disturbance. Respiratory: No cough, shortness of breath or wheezing. Cardiovascular: No chest pain peripheral edema, palpitations. Gastrointestinal: No abdominal distention, no abdominal pain, no change in bowel habits hematochezia or melena. Genitourinary: No dysuria, no frequency, no urgency, no nocturia. Musculoskeletal:Positive for arthritis myalgias and joint pain Neurological: No dizziness, no headaches, no numbness, no seizures, no syncope, no weakness, no tremors. Hematologic: No lymphadenopathy, no easy bruising. Psychiatric: No confusion, no hallucinations, no sleep disturbance.    Physical Exam: Filed Vitals:   12/05/10 1330  BP: 100/60  Pulse: 51  The general appearance reveals a thin woman who has lost 6 pounds since  last seen here in February 2012.  She is very frail and unable to get up on the examination table because of her weakness.Pupils equal and reactive.   Extraocular Movements are full.  There is no scleral icterus.  The mouth and pharynx are normal.  The neck is supple.  The carotids reveal no bruits.  The jugular venous pressure is normal.  The thyroid is not enlarged.  There is no lymphadenopathy.The chest is clear to percussion and auscultation. There are no rales or rhonchi. Expansion of the chest is symmetrical.  The heart reveals a regular rhythm without murmur gallop rub or click.The abdomen is soft and nontender. Bowel sounds are normal. The liver and spleen are not enlarged. There Are no abdominal masses. There are no bruits.The  pedal pulses are good.  There is no phlebitis or edema.  There is no cyanosis or clubbing.Strength is normal and symmetrical in all extremities.  There is no lateralizing weakness.  There are no sensory deficits.The skin is warm and dry.  There is no rash. EKG shows that she is in sinus bradycardia at 51 per minute with occasional premature atrial beats she has nonspecific T-wave flattening but no ischemic changes  Assessment / Plan: Because of her bradycardia and low blood pressure we will reduce her metoprolol to just 12.5 mg b.i.d.

## 2010-12-05 NOTE — Op Note (Signed)
  NAMECHAVONNE, SFORZA NO.:  [id number]  MEDICAL RECORD NO.:  [id number]           PATIENT TYPE:  LOCATION:                                 FACILITY:  PHYSICIAN:  Erasmo Leventhal, M.D.DATE OF BIRTH:  05-11-1926  DATE OF PROCEDURE:  10/28/2010 DATE OF DISCHARGE:                              OPERATIVE REPORT   PREOPERATIVE DIAGNOSIS:  Dislocated right total hip arthroplasty.  POSTOPERATIVE DIAGNOSIS:  Dislocated right total hip arthroplasty.  PROCEDURES: 1. Closed reduction of dislocated total hip arthroplasty. 2. C-arm stress radiography.  SURGEON:  Erasmo Leventhal, M.D.  ASSISTANT:  None.  ANESTHESIA:  Spinal and monitored anesthesia care.  ESTIMATED BLOOD LOSS:  None.  COMPLICATIONS:  None.  DISPOSITION:  PACU, stable.  OPERATIVE DETAILS:  The patient was counseled in the emergency room and brought to PACU, where correct side was identified, taken to the operating room, spinal anesthetic was administered.  Laid supine on OR table.  Following this, traction internal and external rotation. Palpable reduction of the hip, ligaments were symmetric.  C-arm confirmed anatomic reduction of dislocation.  No complicating factors. She was stable to 90 degrees of flexion, 50 degrees of internal rotation.  Knee immobilizer was placed.  She was taken from the operating room to PACU in stable condition.  No complications or problems.  She was stabilized and taken back to PACU.  Discharged to home.          ______________________________ Erasmo Leventhal, M.D.    RAC/MEDQ  D:  11/03/2010  T:  11/03/2010  Job:  [id number]  Electronically Signed by Eugenia Mcalpine M.D. on 12/05/2010 01:02:26 PM

## 2010-12-05 NOTE — Discharge Summary (Signed)
  NAMEANGELA, VAZGUEZ NO.:  [id number]  MEDICAL RECORD NO.:  [id number]           PATIENT TYPE:  LOCATION:                                 FACILITY:  PHYSICIAN:  Erasmo Leventhal, M.D.DATE OF BIRTH:  March 21, 1926  DATE OF ADMISSION:  10/28/2010 DATE OF DISCHARGE:  10/28/2010                              DISCHARGE SUMMARY   ADMISSION DIAGNOSIS:  Dislocated right total hip arthroplasty.  DISCHARGE DIAGNOSIS:  Status post closed reduction of a right total hip arthroplasty dislocation.  HISTORY OF PRESENT ILLNESS:  The patient is an 77 year old female status post bilateral total hip arthroplasties.  The patient was doing well and she was trying to get out of bed at night.  She slipped, fell, dislocated her right hip, unable to weight bear, brought to the emergency room for evaluation.  X-rays revealed she had a dislocated right total hip arthroplasty.  The patient was then evaluated by Dr. Thomasena Edis in the emergency room and set up for a closed reduction under general anesthesia.  ALLERGIES:  CODEINE, MORPHINE, PIROXICAM.  MEDICATIONS:  Alprazolam, digoxin, isosorbide, meclizine, metoprolol, Synthroid, and tramadol.  SURGICAL PROCEDURE:  Under general anesthesia, Dr. Thomasena Edis closed reduced her right total hip arthroplasty without any complications.  DISCHARGE INSTRUCTIONS:  The patient may be weightbearing as tolerated with a long-leg immobilizer on her left leg with use of a walker.  She is to have followup appointment with Dr. Lequita Halt for postop followup evaluation.  DIET:  No restrictions.  MEDICATIONS:  The patient is to resume her alprazolam, digoxin, isosorbide, meclizine, metoprolol, Synthroid and we are going to give her some Norco for discomfort.  CONDITION ON DISCHARGE:  The patient's condition upon discharge to home is good.    Jamelle Rushing, P.A.   ______________________________ Erasmo Leventhal, M.D.   RWK/MEDQ  D:   11/09/2010  T:  11/09/2010  Job:  [id number]  Electronically Signed by Arlyn Leak P.A. on 11/14/2010 09:00:08 AM Electronically Signed by Eugenia Mcalpine M.D. on 12/05/2010 12:59:47 PM

## 2010-12-05 NOTE — Assessment & Plan Note (Signed)
The patient has a past history of paroxysmal atrial fibrillation.  He was admitted to the hospital in December 2011 with paroxysmal atrial fibrillation.  This occurred in the setting of severe hypothyroidism with a TSH level of 222 since then she has been on thyroid replacement and has improved clinically.  She has episodes of shortness of breath but no definite recurrence of atrial fibrillation.  She is not a candidate for Coumadin.  She is a fall risk because of marked muscle weakness and problems with arthritis.  She also has a history of inclusion body myositis.  She had an echocardiogram in December of 2011 which was normal.  She does have mild LVH.

## 2010-12-21 ENCOUNTER — Emergency Department (HOSPITAL_COMMUNITY)
Admission: EM | Admit: 2010-12-21 | Discharge: 2010-12-21 | Disposition: A | Discharge status: Other Acute Inpatient Hospital | Payer: Medicare Other | Source: Home / Self Care | Attending: Emergency Medicine | Admitting: Emergency Medicine

## 2010-12-21 ENCOUNTER — Emergency Department (HOSPITAL_COMMUNITY): Payer: Medicare Other

## 2010-12-21 ENCOUNTER — Emergency Department (HOSPITAL_COMMUNITY)
Admission: EM | Admit: 2010-12-21 | Discharge: 2010-12-21 | Disposition: A | Discharge status: Home / Self Care | Payer: Medicare Other | Attending: Emergency Medicine | Admitting: Emergency Medicine

## 2010-12-21 DIAGNOSIS — Z96649 Presence of unspecified artificial hip joint: Secondary | ICD-10-CM | POA: Insufficient documentation

## 2010-12-21 DIAGNOSIS — M129 Arthropathy, unspecified: Secondary | ICD-10-CM | POA: Insufficient documentation

## 2010-12-21 DIAGNOSIS — T84029A Dislocation of unspecified internal joint prosthesis, initial encounter: Secondary | ICD-10-CM | POA: Insufficient documentation

## 2010-12-21 DIAGNOSIS — M25559 Pain in unspecified hip: Secondary | ICD-10-CM | POA: Insufficient documentation

## 2010-12-21 DIAGNOSIS — E039 Hypothyroidism, unspecified: Secondary | ICD-10-CM | POA: Insufficient documentation

## 2010-12-21 DIAGNOSIS — G7241 Inclusion body myositis [IBM]: Secondary | ICD-10-CM | POA: Insufficient documentation

## 2010-12-21 DIAGNOSIS — R1310 Dysphagia, unspecified: Secondary | ICD-10-CM | POA: Insufficient documentation

## 2010-12-21 DIAGNOSIS — X58XXXA Exposure to other specified factors, initial encounter: Secondary | ICD-10-CM | POA: Insufficient documentation

## 2010-12-21 DIAGNOSIS — Y831 Surgical operation with implant of artificial internal device as the cause of abnormal reaction of the patient, or of later complication, without mention of misadventure at the time of the procedure: Secondary | ICD-10-CM | POA: Insufficient documentation

## 2010-12-21 DIAGNOSIS — I4891 Unspecified atrial fibrillation: Secondary | ICD-10-CM | POA: Insufficient documentation

## 2010-12-21 LAB — DIFFERENTIAL
Lymphs Abs: 0.9 10*3/uL (ref 0.7–4.0)
Monocytes Relative: 6 % (ref 3–12)
Neutro Abs: 7.1 10*3/uL (ref 1.7–7.7)
Neutrophils Relative %: 83 % — ABNORMAL HIGH (ref 43–77)

## 2010-12-21 LAB — URINALYSIS, ROUTINE W REFLEX MICROSCOPIC
Glucose, UA: NEGATIVE mg/dL
Ketones, ur: NEGATIVE mg/dL
Leukocytes, UA: NEGATIVE
Nitrite: POSITIVE — AB
Protein, ur: NEGATIVE mg/dL

## 2010-12-21 LAB — CBC
Hemoglobin: 13.1 g/dL (ref 12.0–15.0)
MCH: 30.2 pg (ref 26.0–34.0)
MCV: 91.7 fL (ref 78.0–100.0)
RBC: 4.34 MIL/uL (ref 3.87–5.11)

## 2010-12-21 LAB — BASIC METABOLIC PANEL
CO2: 29 mEq/L (ref 19–32)
Calcium: 10 mg/dL (ref 8.4–10.5)
Chloride: 100 mEq/L (ref 96–112)
Glucose, Bld: 96 mg/dL (ref 70–99)
Sodium: 136 mEq/L (ref 135–145)

## 2010-12-23 NOTE — Consult Note (Signed)
  [name], [name] NO.:  [id number]  MEDICAL RECORD NO.:  [id number]  LOCATION:  [facility]                         FACILITY:  [facility]  PHYSICIAN:  Alvy Beal, MD    DATE OF BIRTH:  1925-12-21  DATE OF CONSULTATION:  12/21/2010 DATE OF DISCHARGE:  12/21/2010                                CONSULTATION   Consultation is requested by ER for a dislocation of a right total hip replacement.  COMPLICATIONS:  None.  CONDITION:  Stable.  HISTORY:  This is a very pleasant 77 year old woman who about 10 years ago had a total hip replacement by my partner, Dr. Sherlean Leon.  The patient has actually done well and then in the last couple of months, she has had 2 dislocations.  The last one was in April 2012 and this one was just today.  The patient had a postural indiscretion and dislocated right hip.  She was initially seen at [facility] and transferred per Dr. Sherlean Leon, I doubt, to [facility] for orthopedic management.  Upon arrival, she was complaining of significant right hip pain and obvious deformity of the hip and pain.  PAST MEDICAL, SURGICAL, FAMILY, AND SOCIAL HISTORY:  She has arthritis, atrial fibrillation, hypothyroidism, inclusion body myositis.  She is nondrinker, nonsmoker.  Impaired mobility, ambulates with a walker. Also, past medical history of dysphagia and paroxysmal atrial fibrillation.  ALLERGIES:  Include CODEINE, MORPHINE, PIROXICAM.  PHYSICAL EXAMINATION:  GENERAL:  She is a pleasant woman who appears her stated age, in no acute distress. EXTREMITIES:  She is complaining of severe right hip pain.  2+ dorsalis pedis.  She has an intact dorsalis pedis, posterior tibialis pulses that are diminished.  Capillary refill is less than 2 seconds.  EHL, tibialis anterior, gastrocnemius are both grossly intact.  Sensation is intact. No obvious laceration, contusion, abrasion to the hip.  Well-healed surgical scar. ABDOMEN:  Soft and  nontender. CHEST:  No shortness of breath or chest pain.  X-rays do demonstrate a superior dislocation of the right hip, no apparent femur fracture.  PLAN:  At this point in time with the assistance of the ER physician and staff, conscious sedation was done with ketamine and Fentanyl and a successful gentle closed reduction maneuver was performed.  The patient tolerated this well without any significant adverse events. Postreduction x-rays of the hip revealed satisfactory reduction and no apparent fracture.  The patient will go for formal AP lateral views of the entire femur to ensure there is no periprosthetic fracture.  Once she is fully recovered, she can be discharged home or to the nursing facility if able and she can follow up with Dr. Sherlean Leon in 7-10 days for reevaluation.     Alvy Beal, MD     DDB/MEDQ  D:  12/21/2010  T:  12/22/2010  Job:  [id number]  Electronically Signed by Venita Lick MD on 12/23/2010 07:24:36 AM

## 2011-02-18 ENCOUNTER — Ambulatory Visit: Payer: Medicare Other | Admitting: Physical Therapy

## 2011-04-01 ENCOUNTER — Encounter: Payer: Self-pay | Admitting: Cardiology

## 2011-04-16 ENCOUNTER — Ambulatory Visit (INDEPENDENT_AMBULATORY_CARE_PROVIDER_SITE_OTHER): Payer: Medicare Other | Admitting: Cardiology

## 2011-04-16 ENCOUNTER — Encounter: Payer: Self-pay | Admitting: Cardiology

## 2011-04-16 VITALS — BP 122/71 | HR 52 | Ht 65.0 in | Wt 101.0 lb

## 2011-04-16 DIAGNOSIS — I4891 Unspecified atrial fibrillation: Secondary | ICD-10-CM

## 2011-04-16 DIAGNOSIS — E039 Hypothyroidism, unspecified: Secondary | ICD-10-CM

## 2011-04-16 DIAGNOSIS — R634 Abnormal weight loss: Secondary | ICD-10-CM

## 2011-04-16 DIAGNOSIS — I480 Paroxysmal atrial fibrillation: Secondary | ICD-10-CM

## 2011-04-16 NOTE — Assessment & Plan Note (Signed)
The patient has had no recurrence of her atrial fibrillation.  She remains on digoxin and metoprolol.  She is not on Coumadin because of being a fall risk and multiple comorbidities.  He is on baby aspirin daily.

## 2011-04-16 NOTE — Assessment & Plan Note (Signed)
The patient has continued to lose weight and has had a poor appetite.  She's not having any vomiting.  She's not having any diarrhea or hematochezia or melena.  She does have a history of anemia and has been on iron.  When she was last seen in office in February 2012.  Her digoxin level was therapeutic at 1.0.  We did not draw any labs today, but if she continues to have weight loss, and poor appetite.  It may be reasonable to recheck another digoxin level and her primary care provider's office when her next blood tests are drawn.  Now that she has lost more weight.  Her requirements for digoxin may be less

## 2011-04-16 NOTE — Patient Instructions (Signed)
continue same dose of medications  Your physician wants you to follow-up in: 6 months You will receive a reminder letter in the mail two months in advance. If you don't receive a letter, please call our office to schedule the follow-up appointment.  

## 2011-04-16 NOTE — Progress Notes (Signed)
[name] Date of Birth:  1925/08/02 [facility] / [facility] [address]   [address] [place], [place]  [address] [phone]           Fax   [phone]  History of Present Illness: This pleasant 77 year old Asian female is a primary care patient of Dr. Valda Lamb.  The patient has a past history of paroxysmal atrial fibrillation.  She was admitted to the hospital in December with paroxysmal atrial fibrillation in the setting of marked hypothyroidism.  Her TSH at that time was 222.  Since that time.  She has improved to the point that she was able to move out of the nursing home back into her home where her son lives with her.  She was last seen in the office in February 2012 at which time she was chronically ill, but in no acute distress and her weight was 116.  Since then.  She has lost another 15 pounds.  She has a poor appetite.  She has not had any recurrence of atrial fibrillation  Current Outpatient Prescriptions  Medication Sig Dispense Refill  . Albuterol Sulfate (PROAIR HFA IN) Inhale into the lungs. Uses prn       . ALPRAZolam (XANAX) 0.25 MG tablet Take 0.25 mg by mouth at bedtime as needed.        Marland Kitchen aspirin 81 MG tablet Take 81 mg by mouth daily.        Marland Kitchen azaTHIOprine (IMURAN) 50 MG tablet As directed      . digoxin (LANOXIN) 0.125 MG tablet Take 125 mcg by mouth daily.        . isosorbide dinitrate (ISORDIL) 10 MG tablet Take 10 mg by mouth 3 (three) times daily.        Marland Kitchen levothyroxine (SYNTHROID, LEVOTHROID) 75 MCG tablet Take 75 mcg by mouth daily.        . meclizine (ANTIVERT) 25 MG tablet Take 25 mg by mouth 3 (three) times daily as needed.        . metoprolol tartrate (LOPRESSOR) 25 MG tablet 25 mg 2 (two) times daily.       . NON FORMULARY Iron 65mg  daily- take 4 - 5 tabs daily       . traMADol (ULTRAM) 50 MG tablet Take 50 mg by mouth every 6 (six) hours as needed.          Allergies  Allergen Reactions  . Codeine   . Morphine And Related     . Piroxicam     Patient Active Problem List  Diagnoses  . PAF (paroxysmal atrial fibrillation)  . Hypothyroidism    History  Smoking status  . Never Smoker   Smokeless tobacco  . Not on file    History  Alcohol Use No    No family history on file.  Review of Systems: Constitutional: no fever chills diaphoresis or fatigue or change in weight.  Head and neck: no hearing loss, no epistaxis, no photophobia or visual disturbance. Respiratory: No cough, shortness of breath or wheezing. Cardiovascular: No chest pain peripheral edema, palpitations. Gastrointestinal: No abdominal distention, no abdominal pain, no change in bowel habits hematochezia or melena. Genitourinary: No dysuria, no frequency, no urgency, no nocturia. Musculoskeletal:No arthralgias, no back pain, no gait disturbance or myalgias. Neurological: No dizziness, no headaches, no numbness, no seizures, no syncope, no weakness, no tremors. Hematologic: No lymphadenopathy, no easy bruising. Psychiatric: No confusion, no hallucinations, no sleep disturbance.    Physical Exam: Ceasar Mons  Vitals:   04/16/11 1606  BP: 122/71  Pulse: 52   Gen. appearance reveals a very cachectic, elderly woman in no acute distress.  She travels by wheelchair.  She is very weak.  She has a lot of deformity secondary to her rheumatoid arthritis.Pupils equal and reactive.   Extraocular Movements are full.  There is no scleral icterus.  The mouth and pharynx are normal.  The neck is supple.  The carotids reveal no bruits.  The jugular venous pressure is normal.  The thyroid is not enlarged.  There is no lymphadenopathy.  The chest is clear to percussion and auscultation. There are no rales or rhonchi. Expansion of the chest is symmetrical.  The precordium is quiet.  The first heart sound is normal.  The second heart sound is physiologically split.  There is no murmur gallop rub or click.  There is no abnormal lift or heave.  The rhythm is  regular.   Abdomen is nontender and scaphoid.  No palpable masses.  Extremities show a rthritic deformity of her hands.  There is no pedal edema.  Strength is normal and symmetrical in all extremities.  There is no lateralizing weakness.  There are no sensory deficits.  The skin is warm and dry.  There is no rash.     Assessment / Plan: Continue present medication.  Consider a repeat digoxin level at her primary care provider when her next regular labs are drawn.  The digoxin  even if not at a toxic level may be adversely affecting her appetite

## 2011-04-16 NOTE — Assessment & Plan Note (Signed)
The patient has a history of hypothyroidism and is now on Synthroid 75 mcg daily, and this is being monitored at her primary care provider's office

## 2011-04-30 ENCOUNTER — Ambulatory Visit (HOSPITAL_COMMUNITY)
Admission: RE | Admit: 2011-04-30 | Discharge: 2011-04-30 | Disposition: A | Discharge status: Home / Self Care | Payer: Medicare Other | Source: Ambulatory Visit | Attending: Family Medicine | Admitting: Family Medicine

## 2011-04-30 DIAGNOSIS — IMO0001 Reserved for inherently not codable concepts without codable children: Secondary | ICD-10-CM | POA: Insufficient documentation

## 2011-04-30 DIAGNOSIS — M25649 Stiffness of unspecified hand, not elsewhere classified: Secondary | ICD-10-CM | POA: Insufficient documentation

## 2011-04-30 DIAGNOSIS — M25549 Pain in joints of unspecified hand: Secondary | ICD-10-CM | POA: Insufficient documentation

## 2011-04-30 DIAGNOSIS — M6281 Muscle weakness (generalized): Secondary | ICD-10-CM | POA: Insufficient documentation

## 2011-04-30 NOTE — Progress Notes (Signed)
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 Odenthal Scotland Next MD Visit: unknown Prior Therapy: none  Past Medical History:  Past Medical History  Diagnosis Date  . Hypothyroidism   . Atrial fibrillation   . Myositis     BODY  . LVH (left ventricular hypertrophy)     MILD  . Dizziness   . Headache   . Weakness   . Chest pain   . DOE (dyspnea on exertion)   . Poor appetite   . UTI (urinary tract infection)   . Dysphagia     History of dysphagia 2, depressed by esophagus status post dilation  . Arthritis   . Hypokalemia   . History of anxiety   . Orthostatic hypotension   . Inclusion body myositis   . Easy bruising    Past Surgical History:  Past Surgical History  Procedure Date  . Total hip arthroplasty   . Transthoracic echocardiogram 07/08/10    EF 55-60%    Subjective Symptoms/Limitations Symptoms: S: I just can't bend my fingers very well.  They have been like this for 4-5 months.  I can't hardly get my clothes on or wash dishes, or anything. Pain Assessment Currently in Pain?: Yes Pain Score:   5 Pain Location: Hand Pain Orientation: Right;Left Pain Type: Acute pain Pain Radiating Towards: 0/10 in am, increases at night.  Assessment Sensation Additional Comments:  (numbness and tingling at night that travels into her shoulde) RUE AROM (degrees) Right Composite Finger Flexion:  (t 40/74 i 90/90/34 l 90/64/40 r 100/70/4 s 100/84/2) RUE Strength Grip (lbs): 10 Lateral Pinch: 3 lbs 3 Point Pinch: 0 lbs LUE AROM (degrees) Left Composite Finger Extension:  (hyperext PIP I 20 l 30 r 14 s 0) Left Composite Finger Flexion:  (t 34/40 i  64/0/0 l 60 /0/0 r 52/0/0 s 62/0/0) LUE Strength Grip (lbs): 5 Lateral Pinch: 0.5 lbs 3 Point Pinch: 0 lbs  Exercise/Treatments Hand Exercises Joint Blocking Exercises:  (Passive and Active 5 reps each digit each joint, bil hands)  Manual Therapy Manual Therapy: Other (comment) Other Manual Therapy: Manual stretching to bilateral hands at each digit and each joint to increase ability to passively and actively flex digits.  Occupational Therapy Assessment and Plan OT Assessment and Plan Clinical Impression Statement: A:  Patient presents with decreased use of bilateral hands and increased stiffness, pain. Rehab Potential: Good OT Frequency: Min 2X/week OT Duration: 8 weeks OT Treatment/Interventions: Self-care/ADL training;Therapeutic exercise;Manual therapy;Therapeutic activities;Patient/family education;Other (comment) (splinting, modalities, HEP:  PROM and tendon glides) OT Plan: P:  Skilled OT intervention to decrease stiffness in bilateral hands and increase AROM to [facility] for increased ability to complete ADLs.  Treatment Plan:  Manual Therapy to increase ROM.  Ther Ex:  P/AROM,, joint blocking, tendon glides, sponges, issues flexion glove, large pegboard.   Goals Short Term Goals Time to Complete Short Term Goals: 4 weeks Short Term Goal 1: Patient will be educated on HEP. Short Term Goal 2: Patient will increase PROM in bilateral hands to [facility] for increased ability to complete BADLs. Short Term Goal 3: Patient will be able to complete nine hole peg test with left hand independently. Short Term Goal 4: Patient will  increase bilateral grip strength  by 5 pounds and pinch strength by 1 pound for increased ability to open containers. Short Term Goal 5: Patient will decrease pain to 3/10 in bilateral hands. Long Term Goals Time to Complete Long Term Goals: 8 weeks Long Term Goal 1: Patient will be able to use bilateral hands with all B/IADLs. Long Term Goal 2: Patient will increase  bilateral hand AROM by 50% for increased participation with ADLs. Long Term Goal 3: Patient will decrease completion time on Nine Hole Peg Test by 20" in each hand. Long Term Goal 4: Patient will increase bilateral grip strength by 10 pounds and pinch strength by 2 pounds for increased ability to open containers. Long Term Goal 5: Patient will decrease pain to 2/10 in bilateral hands. End of Session Patient Active Problem List  Diagnoses  . PAF (paroxysmal atrial fibrillation)  . Hypothyroidism  . Weight loss, unintentional  . Myalgia and myositis, unspecified  . Muscle weakness (generalized)   End of Session Activity Tolerance: Patient tolerated treatment well General Behavior During Session: [facility] for tasks performed Cognition: [facility] for tasks performed  Time Calculation Start Time: 1515 Stop Time: 1600 Time Calculation (min): 45 min  Shirlean Mylar, OTR/L  04/30/2011, 4:59 PM  Shirlean Mylar, OTR/L

## 2011-05-07 ENCOUNTER — Telehealth (HOSPITAL_COMMUNITY): Payer: Self-pay

## 2011-05-07 ENCOUNTER — Ambulatory Visit (HOSPITAL_COMMUNITY): Payer: Medicare Other | Admitting: Occupational Therapy

## 2011-05-10 ENCOUNTER — Ambulatory Visit (HOSPITAL_COMMUNITY)
Admission: RE | Admit: 2011-05-10 | Discharge: 2011-05-10 | Disposition: A | Discharge status: Home / Self Care | Payer: Medicare Other | Source: Ambulatory Visit | Attending: Family Medicine | Admitting: Family Medicine

## 2011-05-10 DIAGNOSIS — M25649 Stiffness of unspecified hand, not elsewhere classified: Secondary | ICD-10-CM | POA: Insufficient documentation

## 2011-05-10 DIAGNOSIS — M25549 Pain in joints of unspecified hand: Secondary | ICD-10-CM | POA: Insufficient documentation

## 2011-05-10 DIAGNOSIS — IMO0001 Reserved for inherently not codable concepts without codable children: Secondary | ICD-10-CM | POA: Insufficient documentation

## 2011-05-10 DIAGNOSIS — M6281 Muscle weakness (generalized): Secondary | ICD-10-CM | POA: Insufficient documentation

## 2011-05-10 NOTE — Progress Notes (Signed)
Occupational Therapy Treatment  Patient Details  Name: OPLE GIRGIS MRN: [id number] Date of Birth: 1926/03/03  Today's Date: 05/10/2011 Time: 1191-4782 Time Calculation (min): 47 min Visit#: 2  of 16   Re-eval: 05/28/11 [facility] [phone] 08' Therapeutic exercise  302-320  18'    Subjective Symptoms/Limitations Symptoms: S:  I just can't get these hands to do anything. Pain Assessment Currently in Pain?: Yes Pain Score:   1 Pain Location: Shoulder  Exercise/Treatments   Sponges: 26 Right, 2 at a time left.  Hand Exercises MCPJ Flexion: PROM;AROM;AAROM;Both MCPJ Extension: PROM;AROM;AAROM;Both PIPJ Flexion: PROM;AROM;AAROM;Both PIPJ Extension: PROM;AROM;AAROM;Both DIPJ Flexion: PROM;AROM;AAROM;Left DIPJ Extension: PROM;AROM;AAROM;Both Joint Blocking Exercises: x 10 bilateral.  Ed on doing at home. Sponges: 26 Right, 2 at a time left.  Manual Therapy Other Manual Therapy: Manual stretching to bilateral hands at each digit and each joint to increase ability to passively and actively flex digits.  Occupational Therapy Assessment and Plan OT Assessment and Plan Clinical Impression Statement: A:  Added multiple new exercises which patient tolerated well.  Issued flexion glove, educated patient and her son on wear time, two hours on two hours off. Rehab Potential: Good OT Plan: P:  Add large pegboard.   Goals Short Term Goals Time to Complete Short Term Goals: 4 weeks Short Term Goal 1: Patient will be educated on HEP. Short Term Goal 2: Patient will increase PROM in bilateral hands to [facility] for increased ability to complete BADLs. Short Term Goal 3: Patient will be able to complete nine hole peg test with left hand independently. Short Term Goal 4: Patient will increase bilateral grip strength  by 5 pounds and pinch strength by 1 pound for increased ability to open containers. Short Term Goal 5: Patient will decrease pain to 3/10 in bilateral hands. Long Term  Goals Time to Complete Long Term Goals: 8 weeks Long Term Goal 1: Patient will be able to use bilateral hands with all B/IADLs. Long Term Goal 2: Patient will increase bilateral hand AROM by 50% for increased participation with ADLs. Long Term Goal 3: Patient will decrease completion time on Nine Hole Peg Test by 20" in each hand. Long Term Goal 4: Patient will increase bilateral grip strength by 10 pounds and pinch strength by 2 pounds for increased ability to open containers. Long Term Goal 5: Patient will decrease pain to 2/10 in bilateral hands. End of Session Patient Active Problem List  Diagnoses  . PAF (paroxysmal atrial fibrillation)  . Hypothyroidism  . Weight loss, unintentional  . Myalgia and myositis, unspecified  . Muscle weakness (generalized)   End of Session Activity Tolerance: Patient tolerated treatment well General Behavior During Session: [facility] for tasks performed Cognition: [facility] for tasks performed   Farris Blash L. Jazsmin Couse, COTA/L  05/10/2011, 5:45 PM

## 2011-05-14 ENCOUNTER — Inpatient Hospital Stay (HOSPITAL_COMMUNITY): Admission: RE | Admit: 2011-05-14 | Payer: Medicare Other | Source: Ambulatory Visit | Admitting: Specialist

## 2011-05-16 ENCOUNTER — Ambulatory Visit (HOSPITAL_COMMUNITY)
Admission: RE | Admit: 2011-05-16 | Discharge: 2011-05-16 | Disposition: A | Discharge status: Home / Self Care | Payer: Medicare Other | Source: Ambulatory Visit | Attending: Family Medicine | Admitting: Family Medicine

## 2011-05-16 DIAGNOSIS — M6281 Muscle weakness (generalized): Secondary | ICD-10-CM

## 2011-05-16 DIAGNOSIS — IMO0001 Reserved for inherently not codable concepts without codable children: Secondary | ICD-10-CM

## 2011-05-16 NOTE — Progress Notes (Signed)
Occupational Therapy Treatment  Patient Details  Name: [name] MRN: [id number] Date of Birth: 04/19/1926  Today's Date: 05/16/2011 Time: 4098-1191 Time Calculation (min): 40 min Manual Therapy 1345-1400 15' Therapeutic Exercises [phone] 24' Visit#: 3  of 16   Re-eval: 05/28/11    Subjective Symptoms/Limitations Symptoms: S:  I dont trust myself to pick anything up.   Pain Assessment Currently in Pain?: Yes Pain Score:   1 Pain Location: Shoulder Pain Orientation: Right;Left Pain Type: Acute pain     O:  Exercise/Treatments  05/16/11 1600  Hand Exercises  MCPJ Flexion PROM;AROM;AAROM;Both  MCPJ Extension PROM;AROM;AAROM;Both  PIPJ Flexion PROM;AROM;AAROM;Both  PIPJ Extension PROM;AROM;AAROM;Both  DIPJ Flexion PROM;AROM;AAROM;Left  DIPJ Extension PROM;AROM;AAROM;Both  Joint Blocking Exercises x 10 bilateral.  Ed on doing at home.  Sponges all sponges 2 at a time with both hands.  Fine Motor Coordination (tied shoe Ily.)  Large Pegboard placed and removed 15 pegs with each hand, requiring assist to hold pegs upright prior to picking them up with her left hand.   Manual Therapy Other Manual Therapy: Manual stretching to bilateral hands at each digit and joint to increase ability to passively and actively flex and composite flex digits during functional tasks.  [id number]  Occupational Therapy Assessment and Plan OT Assessment and Plan Clinical Impression Statement: A:  Unable to flex PIPJ or DIPJ of left hand digits at all with functional tasks.   OT Plan: P:  Assess sizes of 3 point oval 8 finger splint for all digits on left hand and long finger of right hand.   Goals Short Term Goals Time to Complete Short Term Goals: 4 weeks Short Term Goal 1: Patient will be educated on HEP. Short Term Goal 1 Progress: Progressing toward goal Short Term Goal 2: Patient will increase PROM in bilateral hands to [facility] for increased ability to complete BADLs. Short Term Goal 2  Progress: Progressing toward goal Short Term Goal 3: Patient will be able to complete nine hole peg test with left hand independently. Short Term Goal 3 Progress: Progressing toward goal Short Term Goal 4: Patient will increase bilateral grip strength  by 5 pounds and pinch strength by 1 pound for increased ability to open containers. Short Term Goal 4 Progress: Progressing toward goal Short Term Goal 5: Patient will decrease pain to 3/10 in bilateral hands. Short Term Goal 5 Progress: Progressing toward goal Long Term Goals Time to Complete Long Term Goals: 8 weeks Long Term Goal 1: Patient will be able to use bilateral hands with all B/IADLs. Long Term Goal 1 Progress: Progressing toward goal Long Term Goal 2: Patient will increase bilateral hand AROM by 50% for increased participation with ADLs. Long Term Goal 2 Progress: Progressing toward goal Long Term Goal 3: Patient will decrease completion time on Nine Hole Peg Test by 20" in each hand. Long Term Goal 3 Progress: Progressing toward goal Long Term Goal 4: Patient will increase bilateral grip strength by 10 pounds and pinch strength by 2 pounds for increased ability to open containers. Long Term Goal 4 Progress: Progressing toward goal Long Term Goal 5: Patient will decrease pain to 2/10 in bilateral hands. Long Term Goal 5 Progress: Progressing toward goal End of Session Patient Active Problem List  Diagnoses  . PAF (paroxysmal atrial fibrillation)  . Hypothyroidism  . Weight loss, unintentional  . Myalgia and myositis, unspecified  . Muscle weakness (generalized)   End of Session Equipment Utilized During Treatment: Discussed flexion glove and made some recommendations  to increase comfort of splint.  Discussed use of 3 point oval 8 finger splint to decrease swan neck deformity.  Patient is agreeable to trying splint.   Activity Tolerance: Patient tolerated treatment well General Behavior During Session: [facility] for tasks  performed Cognition: [facility] for tasks performed   Shirlean Mylar, OTR/L  05/16/2011, 4:12 PM

## 2011-05-21 ENCOUNTER — Ambulatory Visit (HOSPITAL_COMMUNITY)
Admission: RE | Admit: 2011-05-21 | Discharge: 2011-05-21 | Disposition: A | Discharge status: Home / Self Care | Payer: Medicare Other | Source: Ambulatory Visit | Attending: Occupational Therapy | Admitting: Occupational Therapy

## 2011-05-21 DIAGNOSIS — IMO0001 Reserved for inherently not codable concepts without codable children: Secondary | ICD-10-CM

## 2011-05-21 DIAGNOSIS — M6281 Muscle weakness (generalized): Secondary | ICD-10-CM

## 2011-05-21 NOTE — Progress Notes (Signed)
Occupational Therapy Treatment  Patient Details  Name: [name] MRN: [id number] Date of Birth: 08-09-1925  Today's Date: 05/21/2011 Time: 1425-1520 Time Calculation (min): 55 min Visit#: 4  of 16   Re-eval: 05/28/11 [facility]  [phone]  24' Therapeutic Exercise  305-320 15'    Subjective Symptoms/Limitations Symptoms: S:  I can't sleep and my arms just feel so weak.  My arms go numb when I lay down. Pain Assessment Currently in Pain?: No/denies Pain Score: 0-No pain   Exercise/Treatments Hand Exercises MCPJ Flexion: PROM;AROM;AAROM;Both MCPJ Extension: PROM;AROM;AAROM;Both PIPJ Flexion: PROM;AROM;AAROM;Both PIPJ Extension: PROM;AROM;AAROM;Both DIPJ Flexion: PROM;AROM;AAROM;Left DIPJ Extension: PROM;AROM;AAROM;Both Joint Blocking Exercises: x 10 bilateral.  Ed on doing at home. Theraputty:  (yellow) Theraputty - Flatten: yellow Theraputty - Roll: yellow Theraputty - Grip: yellow with assist to block jt. Sponges:  (2 at a time with left, 3 at a time with right.) Large Pegboard:  (patient too fatigued)  Manual Therapy Manual Therapy: Myofascial release Other Manual Therapy: Manual stretching to bilateral hands at each digit and each joint to increase ability to passively and actively flex digits.  Occupational Therapy Assessment and Plan OT Assessment and Plan Clinical Impression Statement: A:  Added yellow theraputty, flatten, roll and assisted grip. OT Plan: P:  Assess sizes of 3 point oval 8 finger splint for all digits on left hand and long finger of right hand.   Goals Short Term Goals Time to Complete Short Term Goals: 4 weeks Short Term Goal 1: Patient will be educated on HEP. Short Term Goal 2: Patient will increase PROM in bilateral hands to [facility] for increased ability to complete BADLs. Short Term Goal 3: Patient will be able to complete nine hole peg test with left hand independently. Short Term Goal 4: Patient will increase bilateral grip strength   by 5 pounds and pinch strength by 1 pound for increased ability to open containers. Short Term Goal 5: Patient will decrease pain to 3/10 in bilateral hands. Long Term Goals Time to Complete Long Term Goals: 8 weeks Long Term Goal 1: Patient will be able to use bilateral hands with all B/IADLs. Long Term Goal 2: Patient will increase bilateral hand AROM by 50% for increased participation with ADLs. Long Term Goal 3: Patient will decrease completion time on Nine Hole Peg Test by 20" in each hand. Long Term Goal 4: Patient will increase bilateral grip strength by 10 pounds and pinch strength by 2 pounds for increased ability to open containers. Long Term Goal 5: Patient will decrease pain to 2/10 in bilateral hands. End of Session Patient Active Problem List  Diagnoses  . PAF (paroxysmal atrial fibrillation)  . Hypothyroidism  . Weight loss, unintentional  . Myalgia and myositis, unspecified  . Muscle weakness (generalized)   End of Session Activity Tolerance: Patient limited by fatigue   Meredith Mells L. Poonam Woehrle, COTA/L  05/21/2011, 3:42 PM

## 2011-05-23 ENCOUNTER — Telehealth (HOSPITAL_COMMUNITY): Payer: Self-pay

## 2011-05-23 ENCOUNTER — Ambulatory Visit (HOSPITAL_COMMUNITY): Payer: Medicare Other | Admitting: Specialist

## 2011-05-27 ENCOUNTER — Ambulatory Visit (HOSPITAL_COMMUNITY)
Admission: RE | Admit: 2011-05-27 | Discharge: 2011-05-27 | Disposition: A | Discharge status: Home / Self Care | Payer: Medicare Other | Source: Ambulatory Visit | Attending: Family Medicine | Admitting: Family Medicine

## 2011-05-27 DIAGNOSIS — M6281 Muscle weakness (generalized): Secondary | ICD-10-CM

## 2011-05-27 DIAGNOSIS — IMO0001 Reserved for inherently not codable concepts without codable children: Secondary | ICD-10-CM

## 2011-06-03 ENCOUNTER — Ambulatory Visit (HOSPITAL_COMMUNITY): Payer: Medicare Other | Admitting: Specialist

## 2011-06-03 ENCOUNTER — Telehealth (HOSPITAL_COMMUNITY): Payer: Self-pay

## 2011-06-06 ENCOUNTER — Ambulatory Visit (HOSPITAL_COMMUNITY)
Admission: RE | Admit: 2011-06-06 | Discharge: 2011-06-06 | Disposition: A | Discharge status: Home / Self Care | Payer: Medicare Other | Source: Ambulatory Visit | Attending: Family Medicine | Admitting: Family Medicine

## 2011-06-06 DIAGNOSIS — M6281 Muscle weakness (generalized): Secondary | ICD-10-CM

## 2011-06-06 DIAGNOSIS — IMO0001 Reserved for inherently not codable concepts without codable children: Secondary | ICD-10-CM

## 2011-06-06 NOTE — Progress Notes (Addendum)
Occupational Therapy Treatment  Patient Details  Name: [name] MRN: [id number] Date of Birth: 09/05/1925  Today's Date: 06/06/2011 Time: 7829-5621 Time Calculation (min): 38 min Manual Therapy 3086-5784 20' Reassessment 6962-9528 Therapeutic Exercises [phone] 10' Visit#: 5  of 16   Re-eval: 07/04/11    Subjective Symptoms/Limitations Symptoms: S:  I just dont feel well.  I feel tired and in pain all of the time. Pain Assessment Pain Score:   8 Pain Location: Hand Pain Orientation: Right;Left Pain Type: Acute pain   Exercise/Treatments Hand Exercises MCPJ Flexion: PROM;AROM;AAROM;Both MCPJ Extension: PROM;AROM;AAROM;Both PIPJ Flexion: PROM;AROM;AAROM;Both PIPJ Extension: PROM;AROM;AAROM;Both DIPJ Flexion: PROM;AROM;AAROM;Left DIPJ Extension: PROM;AROM;AAROM;Both Joint Blocking Exercises: x 10 bilateral.  Ed on doing at home. Sponges: right 5 at a time left 3 at a time Large Pegboard: placed and removed 10 pegs using bilateral hands together.  Manual Therapy Manual Therapy: Other (comment) Other Manual Therapy: Manual stretching to bilateral hands at each digit and each joint to increase ability to passively and actively flex digits.  [phone]  Occupational Therapy Assessment and Plan OT Assessment and Plan Clinical Impression Statement: A:  Please see progress note.  Ordered  One size 5 and 3 size 6 oval 8 splints today from [facility].   OT Frequency: Min 2X/week OT Duration: 4 weeks OT Plan: P:  Continue 2 x week x 4 weeks to increase functional use of bilateral hands.   Goals Short Term Goals Time to Complete Short Term Goals: 4 weeks Short Term Goal 1: Patient will be educated on HEP. Short Term Goal 1 Progress: Progressing toward goal Short Term Goal 2: Patient will increase PROM in bilateral hands to [facility] for increased ability to complete BADLs. Short Term Goal 2 Progress: Progressing toward goal Short Term Goal 3: Patient will be able to  complete nine hole peg test with left hand independently. Short Term Goal 3 Progress: Progressing toward goal Short Term Goal 4: Patient will increase bilateral grip strength  by 5 pounds and pinch strength by 1 pound for increased ability to open containers. Short Term Goal 4 Progress: Progressing toward goal Short Term Goal 5: Patient will decrease pain to 3/10 in bilateral hands. Short Term Goal 5 Progress: Progressing toward goal Long Term Goals Time to Complete Long Term Goals: 8 weeks Long Term Goal 1: Patient will be able to use bilateral hands with all B/IADLs. Long Term Goal 1 Progress: Progressing toward goal Long Term Goal 2: Patient will increase bilateral hand AROM by 50% for increased participation with ADLs. Long Term Goal 2 Progress: Progressing toward goal Long Term Goal 3: Patient will decrease completion time on Nine Hole Peg Test by 20" in each hand. Long Term Goal 3 Progress: Progressing toward goal Long Term Goal 4: Patient will increase bilateral grip strength by 10 pounds and pinch strength by 2 pounds for increased ability to open containers. Long Term Goal 4 Progress: Progressing toward goal Long Term Goal 5: Patient will decrease pain to 2/10 in bilateral hands. Long Term Goal 5 Progress: Progressing toward goal End of Session Patient Active Problem List  Diagnoses  . PAF (paroxysmal atrial fibrillation)  . Hypothyroidism  . Weight loss, unintentional  . Myalgia and myositis, unspecified  . Muscle weakness (generalized)   General Behavior During Session: [facility] for tasks performed Cognition: [facility] for tasks performed   Shirlean Mylar, OTR/L  06/06/2011, 3:34 PM

## 2011-06-10 ENCOUNTER — Ambulatory Visit (HOSPITAL_COMMUNITY): Payer: Medicare Other | Admitting: Specialist

## 2011-06-11 ENCOUNTER — Encounter: Payer: Self-pay | Admitting: Cardiology

## 2011-06-12 ENCOUNTER — Telehealth (HOSPITAL_COMMUNITY): Payer: Self-pay

## 2011-06-12 ENCOUNTER — Inpatient Hospital Stay (HOSPITAL_COMMUNITY): Admission: RE | Admit: 2011-06-12 | Payer: Medicare Other | Source: Ambulatory Visit | Admitting: Occupational Therapy

## 2011-06-13 ENCOUNTER — Ambulatory Visit (HOSPITAL_COMMUNITY): Payer: Medicare Other | Admitting: Specialist

## 2011-06-13 ENCOUNTER — Telehealth (HOSPITAL_COMMUNITY): Payer: Self-pay

## 2011-06-17 ENCOUNTER — Ambulatory Visit (HOSPITAL_COMMUNITY): Payer: Medicare Other | Admitting: Specialist

## 2011-06-20 ENCOUNTER — Ambulatory Visit (HOSPITAL_COMMUNITY): Payer: Medicare Other | Admitting: Specialist

## 2011-06-20 ENCOUNTER — Telehealth (HOSPITAL_COMMUNITY): Payer: Self-pay

## 2011-06-24 ENCOUNTER — Ambulatory Visit (HOSPITAL_COMMUNITY): Payer: Medicare Other | Admitting: Specialist

## 2011-06-27 ENCOUNTER — Ambulatory Visit (HOSPITAL_COMMUNITY): Payer: Medicare Other | Admitting: Specialist

## 2011-09-07 ENCOUNTER — Emergency Department (HOSPITAL_COMMUNITY): Payer: Medicare Other

## 2011-09-07 ENCOUNTER — Emergency Department (HOSPITAL_COMMUNITY)
Admission: EM | Admit: 2011-09-07 | Discharge: 2011-09-07 | Disposition: A | Discharge status: Home / Self Care | Payer: Medicare Other | Attending: Emergency Medicine | Admitting: Emergency Medicine

## 2011-09-07 DIAGNOSIS — S73006A Unspecified dislocation of unspecified hip, initial encounter: Secondary | ICD-10-CM

## 2011-09-07 DIAGNOSIS — T84029A Dislocation of unspecified internal joint prosthesis, initial encounter: Secondary | ICD-10-CM | POA: Insufficient documentation

## 2011-09-07 DIAGNOSIS — Z79899 Other long term (current) drug therapy: Secondary | ICD-10-CM | POA: Insufficient documentation

## 2011-09-07 DIAGNOSIS — I4891 Unspecified atrial fibrillation: Secondary | ICD-10-CM | POA: Insufficient documentation

## 2011-09-07 DIAGNOSIS — Z96649 Presence of unspecified artificial hip joint: Secondary | ICD-10-CM | POA: Insufficient documentation

## 2011-09-07 DIAGNOSIS — X500XXA Overexertion from strenuous movement or load, initial encounter: Secondary | ICD-10-CM | POA: Insufficient documentation

## 2011-09-07 DIAGNOSIS — J3489 Other specified disorders of nose and nasal sinuses: Secondary | ICD-10-CM | POA: Insufficient documentation

## 2011-09-07 DIAGNOSIS — R0981 Nasal congestion: Secondary | ICD-10-CM

## 2011-09-07 DIAGNOSIS — E039 Hypothyroidism, unspecified: Secondary | ICD-10-CM | POA: Insufficient documentation

## 2011-09-07 LAB — POCT I-STAT, CHEM 8
BUN: 20 mg/dL (ref 6–23)
Calcium, Ion: 1.21 mmol/L (ref 1.12–1.32)
Creatinine, Ser: 0.7 mg/dL (ref 0.50–1.10)
Glucose, Bld: 84 mg/dL (ref 70–99)
TCO2: 25 mmol/L (ref 0–100)

## 2011-09-07 LAB — CBC
HCT: 28.3 % — ABNORMAL LOW (ref 36.0–46.0)
Hemoglobin: 9.8 g/dL — ABNORMAL LOW (ref 12.0–15.0)
MCV: 100.4 fL — ABNORMAL HIGH (ref 78.0–100.0)
RBC: 2.82 MIL/uL — ABNORMAL LOW (ref 3.87–5.11)
RDW: 14.1 % (ref 11.5–15.5)
WBC: 5.7 10*3/uL (ref 4.0–10.5)

## 2011-09-07 LAB — DIFFERENTIAL
Eosinophils Relative: 0 % (ref 0–5)
Lymphocytes Relative: 10 % — ABNORMAL LOW (ref 12–46)
Lymphs Abs: 0.6 10*3/uL — ABNORMAL LOW (ref 0.7–4.0)
Monocytes Absolute: 0.4 10*3/uL (ref 0.1–1.0)
Monocytes Relative: 7 % (ref 3–12)

## 2011-09-07 MED ORDER — OXYMETAZOLINE HCL 0.05 % NA SOLN
1.0000 | Freq: Once | NASAL | Status: AC
  Administered 2011-09-07: 1 via NASAL

## 2011-09-07 MED ORDER — PROPOFOL 10 MG/ML IV EMUL
20.0000 mL | Freq: Once | INTRAVENOUS | Status: AC
  Filled 2011-09-07: qty 20

## 2011-09-07 MED ORDER — FLUTICASONE PROPIONATE 50 MCG/ACT NA SUSP: 2.0000 | Freq: Every day | NASAL | Status: DC

## 2011-09-07 MED ORDER — PROPOFOL 10 MG/ML IV EMUL: 100.0000 mL | Freq: Once | INTRAVENOUS | Status: DC

## 2011-09-07 MED ORDER — PROPOFOL 10 MG/ML IV BOLUS
INTRAVENOUS | Status: AC | PRN
  Administered 2011-09-07: 50 mg via INTRAVENOUS

## 2011-09-07 NOTE — ED Notes (Signed)
Ortho Doctor pull the leg back in place. Dr. Tanna Savoy remains at bedside

## 2011-09-07 NOTE — ED Notes (Signed)
Pt in x-ray at this time

## 2011-09-07 NOTE — Discharge Instructions (Signed)
Hip Dislocation Hip dislocation is the displacement of the "ball" at the head of your thigh bone (femur) from its socket in the hip bone (pelvis). The ball-and-socket structure of the hip joint gives it a lot of stability, while allowing it to move freely. Therefore, a lot of force is required to displace the femur from its socket. A hip dislocation is an emergency. If you believe you have dislocated your hip and cannot move your leg, call for help immediately. Do not try to move. CAUSES The most common cause of hip dislocation is motor vehicle accidents. However, force from falls from a height (a ladder or building), injuries from contact sports, or injuries from industrial accidents can be enough to dislocate your hip. SYMPTOMS A hip dislocation is very painful. If you have a dislocated hip, you will not be able to move your hip. If you have nerve damage, you may not have feeling in your lower leg, foot, or ankle.  DIAGNOSIS Usually, your caregiver can diagnose a hip dislocation by looking at the position of your leg. Generally, X-ray exams are done to check for fractures in your femur or pelvis. The leg of the dislocated hip will appear shorter than the other leg, and your foot will be turned inward. TREATMENT  Your caregiver can manipulate your bones back into the joint (reduction). If there are no other complications involved with your dislocation, such as fractures or damage to blood vessels or nerves, this procedure can be done without surgery. Before this procedure, you will be given medicine so that you will not feel pain (anesthetic). Often specialized imaging exams are done after the reduction (magnetic resonance imaging [MRI] or computed tomography [CT]) to check for loose pieces of cartilage or bone in the joint. If a manual reduction fails or you have nerve damage, damage to your blood vessels, or bone fractures, surgery will be necessary to perform the reduction.  HOME CARE  INSTRUCTIONS The following measures can help to reduce pain and speed up the healing process:  Rest your injured joint. Do not move your joint if it is painful. Also, avoid activities similar to the one that caused your injury.   Apply ice to your injured joint for 1 to 2 days after your reduction or as directed by your caregiver. Applying ice helps to reduce inflammation and pain.   Put ice in a plastic bag.   Place a towel between your skin and the bag.   Leave the ice on for 15 to 20 minutes at a time, every 2 hours while you are awake.   Use crutches or a walker as directed by your caregiver.   Exercise your hip and leg as directed by your caregiver.   Take over-the-counter or prescription medicine for pain as directed by your caregiver.  SEEK IMMEDIATE MEDICAL CARE IF:  Your pain becomes worse rather than better.   You feel like your hip has become dislocated again.  MAKE SURE YOU:  Understand these instructions.   Will watch your condition.   Will get help right away if you are not doing well or get worse.  Document Released: 03/19/2001 Document Revised: 03/06/2011 Document Reviewed: 11/22/2010 [facility] Patient Information 2012 [place], [place].

## 2011-09-07 NOTE — ED Notes (Signed)
Per ems, pt from home, c/o right hip pain onset last night. Had both hip replaced, sts the right hip came out

## 2011-09-07 NOTE — ED Notes (Signed)
[id number]<AV> Expected date:09/07/11<BR> Expected time:11:14 AM<BR> Means of arrival:Ambulance<BR> Comments:<BR> EMS Hip displaced

## 2011-09-07 NOTE — ED Notes (Signed)
[employer] called to apply knee immobilizer

## 2011-09-07 NOTE — ED Provider Notes (Addendum)
History     CSN: [id number]  Arrival date & time 09/07/11  1117   First MD Initiated Contact with Patient 09/07/11 1138      Chief Complaint  Patient presents with  . Hip Pain    (Consider location/radiation/quality/duration/timing/severity/associated sxs/prior treatment) Patient is a 78 y.o. female presenting with hip pain. The history is provided by the patient.  Hip Pain This is a recurrent problem. The current episode started yesterday (Patient was sitting on the couch and bent over to pick something up and felt her hip pop. Her leg has been performed and she's been unable to walk on it since last night). The problem occurs constantly. The problem has not changed since onset.Associated symptoms comments: None. The symptoms are aggravated by walking, standing and twisting. The symptoms are relieved by nothing. She has tried nothing for the symptoms. The treatment provided no relief.    Past Medical History  Diagnosis Date  . Hypothyroidism   . Atrial fibrillation   . Myositis     BODY  . LVH (left ventricular hypertrophy)     MILD  . Dizziness   . Headache   . Weakness   . Chest pain   . DOE (dyspnea on exertion)   . Poor appetite   . UTI (urinary tract infection)   . Dysphagia     History of dysphagia 2, depressed by esophagus status post dilation  . Arthritis   . Hypokalemia   . History of anxiety   . Orthostatic hypotension   . Inclusion body myositis   . Easy bruising     Past Surgical History  Procedure Date  . Total hip arthroplasty   . Transthoracic echocardiogram 07/08/10    EF 55-60%    No family history on file.  History  Substance Use Topics  . Smoking status: Never Smoker   . Smokeless tobacco: Not on file  . Alcohol Use: No    OB History    Grav Para Term Preterm Abortions TAB SAB Ect Mult Living                  Review of Systems  All other systems reviewed and are negative.    Allergies  Codeine; Morphine and related; and  Piroxicam  Home Medications   Current Outpatient Rx  Name Route Sig Dispense Refill  . ALBUTEROL SULFATE HFA 108 (90 BASE) MCG/ACT IN AERS Inhalation Inhale 2 puffs into the lungs every 6 (six) hours as needed. Shortness of breath    . ALPRAZOLAM 0.5 MG PO TABS Oral Take 0.5 mg by mouth 2 (two) times daily as needed. anxiety    . ASPIRIN 81 MG PO TABS Oral Take 81 mg by mouth daily.      . AZATHIOPRINE 50 MG PO TABS Oral Take 100 mg by mouth daily. As directed    . ISOSORBIDE DINITRATE 10 MG PO TABS Oral Take 10 mg by mouth 3 (three) times daily.      Marland Kitchen LEVOTHYROXINE SODIUM 75 MCG PO TABS Oral Take 75 mcg by mouth daily.      Marland Kitchen MECLIZINE HCL 25 MG PO TABS Oral Take 25 mg by mouth 3 (three) times daily as needed.      Marland Kitchen METOPROLOL TARTRATE 25 MG PO TABS Oral Take 25 mg by mouth daily.     . NON FORMULARY  Iron 65mg  daily- take 4 - 5 tabs daily     . TRAMADOL HCL 50 MG PO TABS Oral Take 50-100  mg by mouth every 8 (eight) hours as needed. pain      BP 166/72  Pulse 78  Temp(Src) 97.3 F (36.3 C) (Oral)  Resp 16  SpO2 100%  Physical Exam  Nursing note and vitals reviewed. Constitutional: She is oriented to person, place, and time. She appears well-developed and well-nourished. No distress.  HENT:  Head: Normocephalic and atraumatic.  Mouth/Throat: Oropharynx is clear and moist.  Eyes: EOM are normal. Pupils are equal, round, and reactive to light.  Cardiovascular: Normal rate, regular rhythm, normal heart sounds and intact distal pulses.  Exam reveals no friction rub.   No murmur heard. Pulmonary/Chest: Effort normal and breath sounds normal. She has no wheezes. She has no rales.  Abdominal: Soft. Bowel sounds are normal. She exhibits no distension. There is no tenderness. There is no rebound and no guarding.  Musculoskeletal: She exhibits no tenderness.       Right hip: She exhibits decreased range of motion, tenderness and deformity.       No edema  Neurological: She is alert and  oriented to person, place, and time. No cranial nerve deficit.  Skin: Skin is warm and dry. No rash noted.  Psychiatric: She has a normal mood and affect. Her behavior is normal.    ED Course  Procedures (including critical care time)  Labs Reviewed  CBC - Abnormal; Notable for the following:    RBC 2.82 (*)    Hemoglobin 9.8 (*)    HCT 28.3 (*)    MCV 100.4 (*)    MCH 34.8 (*)    All other components within normal limits  DIFFERENTIAL - Abnormal; Notable for the following:    Neutrophils Relative 83 (*)    Lymphocytes Relative 10 (*)    Lymphs Abs 0.6 (*)    All other components within normal limits  POCT I-STAT, CHEM 8 - Abnormal; Notable for the following:    Hemoglobin 9.9 (*)    HCT 29.0 (*)    All other components within normal limits   Dg Hip Complete Right  09/07/2011  *RADIOLOGY REPORT*  Clinical Data: Hip pain  RIGHT HIP - COMPLETE 2+ VIEW  Comparison: Plain film 12/21/2010  Findings: There is posterior and superior dislocation of the right femoral prosthetic.  The left prosthetic is located.  No evidence of fracture.  IMPRESSION: Right hip dislocation.  Original Report Authenticated By: Genevive Bi, M.D.   Procedural sedation Performed byGwyneth Sprout 1555: 09/07/2011 Consent: Verbal consent obtained. Risks and benefits: risks, benefits and alternatives were discussed Required items: required blood products, implants, devices, and special equipment available Patient identity confirmed: arm band and provided demographic data Time out: Immediately prior to procedure a "time out" was called to verify the correct patient, procedure, equipment, support staff and site/side marked as required.  Sedation type: moderate (conscious) sedation NPO time confirmed and considedered  Sedatives: PROPOFOL  Physician Time at Bedside:30  Vitals: Vital signs were monitored during sedation. Cardiac Monitor, pulse oximeter Patient tolerance: Patient tolerated the procedure  well with no immediate complications. Comments: Pt with uneventful recovered. Returned to pre-procedural sedation baseline     1. Hip dislocation   2. Sinus congestion       MDM   Patient with a prosthetic hip that came out last night when she bent over on the couch. EMS brought her in today her right leg is internally rotated and shortened. She has good pulses in is neurovascularly intact. Plain film pending.  Dr. Despina Hick has done  prior surgeries. Plain film shows a right prosthetic hip dislocation. Dr. Charlann Boxer will consult.  Pt consciously sedated. Dr. Charlann Boxer Reduced the hip.  The patient tolerated the procedure well. There were no poor outcomes. Patient will be placed in knee immobilizer and followup with orthopedics.       Gwyneth Sprout, MD 09/07/11 1605  Gwyneth Sprout, MD 09/07/11 1609  Gwyneth Sprout, MD 09/19/11 1105

## 2011-09-08 NOTE — Consult Note (Signed)
[name], [name] NO.:  [id number]  MEDICAL RECORD NO.:  [id number]  LOCATION:  WA20                         FACILITY:  [facility]  PHYSICIAN:  Madlyn Frankel. Charlann Boxer, M.D.  DATE OF BIRTH:  1926/03/06  DATE OF CONSULTATION: DATE OF DISCHARGE:  09/07/2011                                CONSULTATION   REASON FOR CONSULTATION:  Dislocated right total hip replacement. Consulting physician was the emergency room.  HPI:  [name] is an 78 year old female of patient of Dr. Ollen Gross with a history of right total hip replacement.  She was at home today sitting on the sofa and then twisted and reached to her right side when she felt her pop out again.  She had immediate onset of pain.  Her son had contacted [place] earlier in the day and was instructed to come to the emergency room.  Once in the emergency room and evaluated, Orthopedics was consulted.  She was seen and evaluated in the emergency room.  She was awake, alert, and oriented, in obvious discomfort.  She was having a hard time with incontinence due to the amount of pain in the knee mobility.  She had no other major complaints or concerns.  Please note that this is the third time that the hip is dislocated with most recently reduced in April 2012.  ALLERGIES: 1. CODEINE. 2. MORPHINE. 3. PIROXICAM.  CURRENT MEDICATIONS: 1. Alprazolam. 2. Digoxin. 3. Isosorbide. 4. Meclizine. 5. Metoprolol. 6. Synthroid. 7. Tramadol.  PAST MEDICAL HISTORY:  Includes atrial fibrillation, Hypothyroidism, dysphagia, paroxysmal AFib.  SURGICAL HISTORY:  Bilateral total hip replacement, the date of which is unknown; hysterectomy.  SOCIAL HISTORY:  She lives with her family.  She is a nonsmoker.  She typically walks with a walker.  EXAMINATION:  Seen evaluated emergency room.  Her vital signs were stable.  She is awake, alert, oriented, talkative, and in obvious discomfort.  Examination found that she had a flexed  and internally rotated right hip pain with any movement at all.  She is otherwise neurovascularly intact.  General medical exam was deferred to the emergency room evaluation.  ASSESSMENT:  Closed dislocation of right total hip replacement.  PLAN:  The patient was seen and evaluated in the emergency room with emergency room physician to provide propofol anesthesia.  The plan will be for closed reduction in the emergency room and placement into a knee immobilizer and follow up with Dr. Lequita Halt in 2-3 weeks.     Madlyn Frankel Charlann Boxer, M.D.     MDO/MEDQ  D:  09/07/2011  T:  09/08/2011  Job:  [id number]

## 2011-09-08 NOTE — Op Note (Signed)
NAMEDORTHULA, BIER NO.:  [id number]  MEDICAL RECORD NO.:  [id number]  LOCATION:  WA20                         FACILITY:  [facility]  PHYSICIAN:  Madlyn Frankel. Charlann Boxer, M.D.  DATE OF BIRTH:  1925-08-29  DATE OF PROCEDURE:  09/07/2011 DATE OF DISCHARGE:  09/07/2011                              OPERATIVE REPORT   PREPROCEDURE DIAGNOSIS:  Closed dislocation of right total hip replacement.  POSTOPERATIVE DIAGNOSIS:  Closed dislocation of right total hip replacement.  PROCEDURE:  Closed reduction of right total hip replacement under monitored propofol anesthetic in the emergency room.  SURGEON:  Madlyn Frankel. Charlann Boxer, M.D.  ASSISTANT:  Lanney Gins, PA, and ER team.  COMPLICATIONS:  None apparent.  INDICATIONS OF THE PROCEDURE:  Ms. Trevizo is an 78 year old female who was seen and evaluated in the emergency room after consultation for dislocation of the right hip.  It happened earlier in the day when she had twisted and bent to the right side and noted immediate onset of pain.  She was brought to the emergency room by her son.  She was seen and evaluated.  Radiographs indicated a superior-posterior dislocation of the right hip.  There was no evidence of any periprosthetic fracture. After reviewing with her the risks and benefits, she had been through this few times and she was ready to have it reduced.  PROCEDURE IN DETAIL:  The patient was supine on the ER stretcher.  Once appropriate documentation and preparation were underway, she was given a propofol infusion by the emergency room physician.  Once she was adequately sedated, pressure was applied to the anterior pelvis and the hip was flexed up, internally rotated, and traction applied.  The hip reduced without complication.  The leg lengths were restored.  She appeared to have a pretty tight adductor compartment, only externally rotating to about 10 to 15 degrees, externally rotating about 50 degrees, but no  further to prevent dislocation.  She was placed into a knee immobilizer and was discharged home. Weightbearing as tolerated.  Instructed to follow up with Dr. Ollen Gross in 2 to 3 weeks.     Madlyn Frankel Charlann Boxer, M.D.     MDO/MEDQ  D:  09/07/2011  T:  09/08/2011  Job:  [id number]

## 2011-09-19 NOTE — Progress Notes (Signed)
Encounter addended by: Gwyneth Sprout, MD on: 09/19/2011 11:06 AM<BR>     Documentation filed: Flowsheet VN, Charting, Inpatient Notes

## 2011-10-10 ENCOUNTER — Ambulatory Visit (INDEPENDENT_AMBULATORY_CARE_PROVIDER_SITE_OTHER): Payer: Medicare Other | Admitting: Cardiology

## 2011-10-10 ENCOUNTER — Encounter: Payer: Self-pay | Admitting: Cardiology

## 2011-10-10 VITALS — BP 110/68 | HR 50 | Ht 64.0 in

## 2011-10-10 DIAGNOSIS — E039 Hypothyroidism, unspecified: Secondary | ICD-10-CM

## 2011-10-10 DIAGNOSIS — I4891 Unspecified atrial fibrillation: Secondary | ICD-10-CM

## 2011-10-10 DIAGNOSIS — R634 Abnormal weight loss: Secondary | ICD-10-CM

## 2011-10-10 DIAGNOSIS — I480 Paroxysmal atrial fibrillation: Secondary | ICD-10-CM

## 2011-10-10 NOTE — Assessment & Plan Note (Signed)
The patient has had no recurrence of her paroxysmal atrial fibrillation.  She is not on Coumadin because of her comorbidities.  She remains on  metoprolol but it is no longer on digoxin.  Her pulse remains in sinus bradycardia at 53 per minute which she is tolerating well

## 2011-10-10 NOTE — Progress Notes (Signed)
[name] Date of Birth:  04/29/1926 [facility] [address] [place], [place]  [address] [phone]         Fax   [phone]  History of Present Illness: This pleasant 78 year old woman is seen for a six-month office visit.  Her son with whom she lives is with her today.  The patient has a past history of severe hypothyroidism and a past history of paroxysmal atrial fibrillation.  She was admitted to the hospital in December 2011 with paroxysmal atrial fibrillation in the setting of marked hypothyroidism.  Her TSH at that time was 222.  She had stopped taking her thyroid on her own.  She was extremely weak at first but made good progress and eventually was able to move from the nursing home back into her own home where her son lives with her.  She reports that she is still losing weight.  Her son states that the patient does not eat because she is afraid that she might choke on her food.  Current Outpatient Prescriptions  Medication Sig Dispense Refill  . albuterol (PROVENTIL HFA;VENTOLIN HFA) 108 (90 BASE) MCG/ACT inhaler Inhale 2 puffs into the lungs every 6 (six) hours as needed. Shortness of breath      . ALPRAZolam (XANAX) 0.5 MG tablet Take 0.5 mg by mouth 2 (two) times daily as needed. anxiety      . aspirin 81 MG tablet Take 81 mg by mouth daily.        Marland Kitchen azaTHIOprine (IMURAN) 50 MG tablet Take 100 mg by mouth daily. As directed      . fluticasone (FLONASE) 50 MCG/ACT nasal spray Place 2 sprays into the nose daily.  16 g  2  . isosorbide dinitrate (ISORDIL) 10 MG tablet Take 10 mg by mouth 3 (three) times daily.        Marland Kitchen levothyroxine (SYNTHROID, LEVOTHROID) 75 MCG tablet Take 75 mcg by mouth daily.        . meclizine (ANTIVERT) 25 MG tablet Take 25 mg by mouth 3 (three) times daily as needed.        . metoprolol tartrate (LOPRESSOR) 25 MG tablet Take 25 mg by mouth daily.       . NON FORMULARY Iron 65mg  daily- take 4 - 5 tabs daily       . traMADol  (ULTRAM) 50 MG tablet Take 50-100 mg by mouth every 8 (eight) hours as needed. pain        Allergies  Allergen Reactions  . Codeine Other (See Comments)    unknown  . Morphine And Related Other (See Comments)    unknown  . Piroxicam Other (See Comments)    unknown    Patient Active Problem List  Diagnoses  . PAF (paroxysmal atrial fibrillation)  . Hypothyroidism  . Weight loss, unintentional  . Myalgia and myositis, unspecified  . Muscle weakness (generalized)    History  Smoking status  . Never Smoker   Smokeless tobacco  . Not on file    History  Alcohol Use No    No family history on file.  Review of Systems: Constitutional: no fever chills diaphoresis or fatigue or change in weight.  Head and neck: no hearing loss, no epistaxis, no photophobia or visual disturbance. Respiratory: No cough, shortness of breath or wheezing. Cardiovascular: No chest pain peripheral edema, palpitations. Gastrointestinal: No abdominal distention, no abdominal pain, no change in bowel habits hematochezia or melena. Genitourinary: No dysuria, no frequency, no urgency,  no nocturia. Musculoskeletal:No arthralgias, no back pain, no gait disturbance or myalgias. Neurological: No dizziness, no headaches, no numbness, no seizures, no syncope, no weakness, no tremors. Hematologic: No lymphadenopathy, no easy bruising. Psychiatric: No confusion, no hallucinations, no sleep disturbance.    Physical Exam: Filed Vitals:   10/10/11 1545  BP: 110/68  Pulse: 50   this is a very pleasant and alert but chronically low appearing and emaciated elderly woman in no distress.  Pupils equal and reactive.   Extraocular Movements are full.  There is no scleral icterus.  The mouth and pharynx are normal.  The neck is supple.  The carotids reveal no bruits.  The jugular venous pressure is normal.  The thyroid is not enlarged.  There is no lymphadenopathy.  The chest is clear to percussion and auscultation.  There are no rales or rhonchi. Expansion of the chest is symmetrical.  Heart reveals a regular rhythm without murmur gallop rub or click.The abdomen is soft and nontender. Bowel sounds are normal. The liver and spleen are not enlarged. There Are no abdominal masses. There are no bruits.  The pedal pulses are good.  There is no phlebitis or edema.  There is no cyanosis or clubbing.  EKG shows sinus bradycardia and is otherwise within normal limits.  T waves appear normal.   Assessment / Plan: Continue same medication.  We are cutting back on her isosorbide dinitrate to just twice a day.  She states that the evening dose has been keeping her up at night with palpitations.  Recheck in 6 months for followup office visit and EKG

## 2011-10-10 NOTE — Assessment & Plan Note (Signed)
The patient arrives by wheelchair today and we were not able to weigh her.  She is extremely weak.  Her son states that she is still losing weight.

## 2011-10-10 NOTE — Assessment & Plan Note (Signed)
Her thyroid function is monitored by Dr. Michaelsen [place].  She is clinically euthyroid now on current therapy.  Patient understands that she can never go off her thyroid medicine in the future.

## 2011-10-10 NOTE — Patient Instructions (Signed)
Your physician wants you to follow-up in: 6 months with Dr. Patty Sermons.  You will receive a reminder letter in the mail two months in advance. If you don't receive a letter, please call our office to schedule the  follow-up appointment.  Decrease the frequency of your isosorbide to twice daily only.

## 2011-10-24 ENCOUNTER — Emergency Department (HOSPITAL_COMMUNITY): Payer: Medicare Other

## 2011-10-24 ENCOUNTER — Emergency Department (HOSPITAL_COMMUNITY)
Admission: EM | Admit: 2011-10-24 | Discharge: 2011-10-24 | Disposition: A | Discharge status: Home / Self Care | Payer: Medicare Other | Attending: Emergency Medicine | Admitting: Emergency Medicine

## 2011-10-24 DIAGNOSIS — Z7982 Long term (current) use of aspirin: Secondary | ICD-10-CM | POA: Insufficient documentation

## 2011-10-24 DIAGNOSIS — Z96649 Presence of unspecified artificial hip joint: Secondary | ICD-10-CM | POA: Insufficient documentation

## 2011-10-24 DIAGNOSIS — R64 Cachexia: Secondary | ICD-10-CM | POA: Insufficient documentation

## 2011-10-24 DIAGNOSIS — M25559 Pain in unspecified hip: Secondary | ICD-10-CM | POA: Insufficient documentation

## 2011-10-24 DIAGNOSIS — Z79899 Other long term (current) drug therapy: Secondary | ICD-10-CM | POA: Insufficient documentation

## 2011-10-24 DIAGNOSIS — I498 Other specified cardiac arrhythmias: Secondary | ICD-10-CM | POA: Insufficient documentation

## 2011-10-24 DIAGNOSIS — X500XXA Overexertion from strenuous movement or load, initial encounter: Secondary | ICD-10-CM | POA: Insufficient documentation

## 2011-10-24 DIAGNOSIS — I4891 Unspecified atrial fibrillation: Secondary | ICD-10-CM | POA: Insufficient documentation

## 2011-10-24 DIAGNOSIS — T84029A Dislocation of unspecified internal joint prosthesis, initial encounter: Secondary | ICD-10-CM | POA: Insufficient documentation

## 2011-10-24 DIAGNOSIS — S73004A Unspecified dislocation of right hip, initial encounter: Secondary | ICD-10-CM

## 2011-10-24 DIAGNOSIS — E039 Hypothyroidism, unspecified: Secondary | ICD-10-CM | POA: Insufficient documentation

## 2011-10-24 LAB — POCT I-STAT, CHEM 8
Creatinine, Ser: 0.7 mg/dL (ref 0.50–1.10)
HCT: 30 % — ABNORMAL LOW (ref 36.0–46.0)
Hemoglobin: 10.2 g/dL — ABNORMAL LOW (ref 12.0–15.0)
Potassium: 4.2 mEq/L (ref 3.5–5.1)
Sodium: 135 mEq/L (ref 135–145)
TCO2: 20 mmol/L (ref 0–100)

## 2011-10-24 MED ORDER — PROPOFOL 10 MG/ML IV BOLUS
INTRAVENOUS | Status: AC | PRN
  Administered 2011-10-24 (×2): 50 mg via INTRAVENOUS

## 2011-10-24 MED ORDER — SODIUM CHLORIDE 0.9 % IV SOLN
INTRAVENOUS | Status: AC | PRN
  Administered 2011-10-24: 125 mL/h via INTRAVENOUS

## 2011-10-24 MED ORDER — PROPOFOL 10 MG/ML IV EMUL
20.0000 mL | Freq: Once | INTRAVENOUS | Status: DC
  Filled 2011-10-24: qty 20

## 2011-10-24 MED ORDER — ONDANSETRON HCL 4 MG/2ML IJ SOLN
4.0000 mg | Freq: Once | INTRAMUSCULAR | Status: AC
  Administered 2011-10-24: 4 mg via INTRAVENOUS
  Filled 2011-10-24: qty 2

## 2011-10-24 MED ORDER — TRAMADOL HCL 50 MG PO TABS: 50.0000 mg | ORAL_TABLET | Freq: Four times a day (QID) | ORAL | Status: AC | PRN

## 2011-10-24 MED ORDER — HYDROMORPHONE HCL PF 1 MG/ML IJ SOLN
0.5000 mg | Freq: Once | INTRAMUSCULAR | Status: AC
  Administered 2011-10-24: 0.5 mg via INTRAVENOUS
  Filled 2011-10-24: qty 1

## 2011-10-24 MED ORDER — MORPHINE SULFATE 4 MG/ML IJ SOLN
4.0000 mg | Freq: Once | INTRAMUSCULAR | Status: DC
  Filled 2011-10-24: qty 1

## 2011-10-24 MED ORDER — SODIUM CHLORIDE 0.9 % IV SOLN: Freq: Once | INTRAVENOUS | Status: DC

## 2011-10-24 NOTE — Discharge Instructions (Signed)
Your hip has been put back into the proper position.  There are no broken bones.  Use tramadol for pain.  Followup with your orthopedist, for reevaluation.  Return for uncontrolled pain or recurrent dislocation

## 2011-10-24 NOTE — ED Notes (Signed)
Pt dc'd per ems.  Pt stable.  No complaints at this time.  Pt son at bedside

## 2011-10-24 NOTE — ED Provider Notes (Addendum)
History     CSN: [id number]  Arrival date & time 10/24/11  1508   First MD Initiated Contact with Patient 10/24/11 1513      Chief Complaint  Patient presents with  . Hip Pain    rt hip popped out while pt was sitting on couch; hx of same    (Consider location/radiation/quality/duration/timing/severity/associated sxs/prior treatment) Patient is a 78 y.o. female presenting with hip pain. The history is provided by the patient and medical records.  Hip Pain   the patient is a 78 year old, female, with a history of prosthetic right hip.  She was seen in the couch she twisted and her right hip popped out.  She complains of right hip pain.  She denies any other pain or injuries.  She has had this same thing occur in the past, and it was reduced in the emergency department records show that the last occurrence was on March 2.   The hip was reduced using propofol by Dr. Charlann Boxer.  Past Medical History  Diagnosis Date  . Hypothyroidism   . Atrial fibrillation   . Myositis     BODY  . LVH (left ventricular hypertrophy)     MILD  . Dizziness   . Headache   . Weakness   . Chest pain   . DOE (dyspnea on exertion)   . Poor appetite   . UTI (urinary tract infection)   . Dysphagia     History of dysphagia 2, depressed by esophagus status post dilation  . Arthritis   . Hypokalemia   . History of anxiety   . Orthostatic hypotension   . Inclusion body myositis   . Easy bruising     Past Surgical History  Procedure Date  . Total hip arthroplasty   . Transthoracic echocardiogram 07/08/10    EF 55-60%    No family history on file.  History  Substance Use Topics  . Smoking status: Never Smoker   . Smokeless tobacco: Not on file  . Alcohol Use: No    OB History    Grav Para Term Preterm Abortions TAB SAB Ect Mult Living                  Review of Systems  Musculoskeletal:       Right hip pain  All other systems reviewed and are negative.    Allergies  Codeine; Morphine  and related; and Piroxicam  Home Medications   Current Outpatient Rx  Name Route Sig Dispense Refill  . ALBUTEROL SULFATE HFA 108 (90 BASE) MCG/ACT IN AERS Inhalation Inhale 2 puffs into the lungs every 6 (six) hours as needed. Shortness of breath    . ALPRAZOLAM 0.5 MG PO TABS Oral Take 0.5 mg by mouth 2 (two) times daily as needed. anxiety    . ASPIRIN 81 MG PO TABS Oral Take 81 mg by mouth daily.      . AZATHIOPRINE 50 MG PO TABS Oral Take 100 mg by mouth daily. As directed    . FLUTICASONE PROPIONATE 50 MCG/ACT NA SUSP Nasal Place 2 sprays into the nose daily. 16 g 2  . ISOSORBIDE DINITRATE 10 MG PO TABS Oral Take 1 tablet (10 mg total) by mouth 2 (two) times daily.    Marland Kitchen LEVOTHYROXINE SODIUM 75 MCG PO TABS Oral Take 75 mcg by mouth daily.      Marland Kitchen MECLIZINE HCL 25 MG PO TABS Oral Take 25 mg by mouth 3 (three) times daily as needed.      Marland Kitchen  METOPROLOL TARTRATE 25 MG PO TABS Oral Take 25 mg by mouth daily.     . NON FORMULARY  Iron 65mg  daily- take 4 - 5 tabs daily     . TRAMADOL HCL 50 MG PO TABS Oral Take 50-100 mg by mouth every 8 (eight) hours as needed. pain      There were no vitals taken for this visit.  Physical Exam  Vitals reviewed. Constitutional: She is oriented to person, place, and time.       Cachectic elderly female, in no distress  HENT:  Head: Normocephalic and atraumatic.  Eyes: Conjunctivae are normal.  Neck: Normal range of motion. Neck supple.  Cardiovascular: Normal rate.        Irregular  Pulmonary/Chest: Effort normal and breath sounds normal. No respiratory distress.  Abdominal: She exhibits no distension.  Musculoskeletal:       Right hip internally rotated  Neurological: She is alert and oriented to person, place, and time.  Skin: Skin is warm and dry.  Psychiatric: She has a normal mood and affect. Thought content normal.    ED Course  Procedures (including critical care time) Right prosthetic hip dislocation  Labs Reviewed - No data to  display No results found.   5:51 PM Spoke with orthopedist on duty. He will come relocate hip.  PROCEDURAL SEDATION Consent obtained for PSA Indication : right prosthetic hip dislocation. Pt on monitor with oxygen Time out Propofol 50 mg bolus X2.  Pt sedated Procedure easily completed by orthopedist. Pt returned to normal mental status in approx. 15 min. Physician time :30 min total.   7:47 PM Recheck sbp 125.  sats 1001%.  Mentation normal.  Spoke with pt and son.  Both understand and agree with plan.    MDM  Prosthetic right hip dislocation Reduced in ed - no complications        Cheri Guppy, MD 10/24/11 1751  Cheri Guppy, MD 10/24/11 1930  Cheri Guppy, MD 10/24/11 1610

## 2011-10-24 NOTE — Consult Note (Signed)
Reason for Consult:Dislocated right prosthetic hip  Referring Physician: Capriossi MD  [name] is an 78 y.o. female.  HPI: 78 yo female with right THA by Dr Despina Hick s/p dislocation today.  She reports this as the fourth dislocation.  Dr Charlann Boxer reduced the last one in the ED with conscious sedation. Patient is not aware of what she did wrong but was on the couch when she tried to move her leg to get up.  Immediate severe pain.  Unable to walk after that.  Past Medical History  Diagnosis Date  . Hypothyroidism   . Atrial fibrillation   . Myositis     BODY  . LVH (left ventricular hypertrophy)     MILD  . Dizziness   . Headache   . Weakness   . Chest pain   . DOE (dyspnea on exertion)   . Poor appetite   . UTI (urinary tract infection)   . Dysphagia     History of dysphagia 2, depressed by esophagus status post dilation  . Arthritis   . Hypokalemia   . History of anxiety   . Orthostatic hypotension   . Inclusion body myositis   . Easy bruising     Past Surgical History  Procedure Date  . Total hip arthroplasty   . Transthoracic echocardiogram 07/08/10    EF 55-60%    No family history on file.  Social History:  reports that she has never smoked. She does not have any smokeless tobacco history on file. She reports that she does not drink alcohol or use illicit drugs.  Allergies:  Allergies  Allergen Reactions  . Codeine Other (See Comments)    unknown  . Morphine And Related Other (See Comments)    unknown  . Piroxicam Other (See Comments)    unknown    Medications: I have reviewed the patient's current medications.  Results for orders placed during the hospital encounter of 10/24/11 (from the past 48 hour(s))  POCT I-STAT, CHEM 8     Status: Abnormal   Collection Time   10/24/11  4:57 PM      Component Value Range Comment   Sodium 135  135 - 145 (mEq/L)    Potassium 4.2  3.5 - 5.1 (mEq/L)    Chloride 107  96 - 112 (mEq/L)    BUN 21  6 - 23 (mg/dL)    Creatinine, Ser 1.61  0.50 - 1.10 (mg/dL)    Glucose, Bld 79  70 - 99 (mg/dL)    Calcium, Ion 0.96 (*) 1.12 - 1.32 (mmol/L)    TCO2 20  0 - 100 (mmol/L)    Hemoglobin 10.2 (*) 12.0 - 15.0 (g/dL)    HCT 04.5 (*) 40.9 - 46.0 (%)     Dg Hip Complete Right  10/24/2011  *RADIOLOGY REPORT*  Clinical Data: Fall, hip dislocation  RIGHT HIP - COMPLETE 2+ VIEW  Comparison: September 25, 2011  Findings: There is superior posterior dislocation of the right femoral prosthesis from the acetabular component.  There is no evidence of fracture.  The left hip prosthesis is normally aligned.  IMPRESSION: There is superior posterior dislocation of the right femoral prosthesis from the acetabular component, similar to the prior study.  No fracture.  Original Report Authenticated By: Brandon Melnick, M.D.    ROS Blood pressure 149/58, pulse 54, temperature 97.5 F (36.4 C), temperature source Oral, resp. rate 18, SpO2 99.00%. Physical Exam Short, externally rotated R LE.  NVI Pain free L LU  ROM  Assessment/Plan: Dislocated right THA. Closed reduction in ED Knee immobilizer, walker, hip precautions Follow up with Dr Trudee Grip at [facility] next week. [phone]  Deangela Randleman,STEVEN R 10/24/2011, 6:42 PM

## 2011-10-24 NOTE — Progress Notes (Signed)
Encounter addended by: Clarene Critchley on: 10/24/2011  2:50 PM<BR>     Documentation filed: Flowsheet VN

## 2011-10-24 NOTE — ED Notes (Signed)
WUJ:WJ19<JY> Expected date:10/24/11<BR> Expected time:<BR> Means of arrival:<BR> Comments:<BR> EMS 6 RK - hip dislocation

## 2011-11-08 ENCOUNTER — Other Ambulatory Visit: Payer: Self-pay | Admitting: Orthopedic Surgery

## 2011-11-08 MED ORDER — DEXAMETHASONE SODIUM PHOSPHATE 10 MG/ML IJ SOLN: 10.0000 mg | Freq: Once | INTRAMUSCULAR | Status: DC

## 2011-11-08 MED ORDER — BUPIVACAINE LIPOSOME 1.3 % IJ SUSP: 20.0000 mL | Freq: Once | INTRAMUSCULAR | Status: DC

## 2011-11-13 ENCOUNTER — Telehealth: Payer: Self-pay | Admitting: *Deleted

## 2011-11-13 NOTE — Telephone Encounter (Signed)
Received a note from [facility] regarding surgical clearance.   Dr. Patty Sermons reviewed information and when I went to call patient saw she had an appointment end of May

## 2011-12-05 ENCOUNTER — Ambulatory Visit (INDEPENDENT_AMBULATORY_CARE_PROVIDER_SITE_OTHER): Payer: Medicare Other | Admitting: Cardiology

## 2011-12-05 ENCOUNTER — Encounter: Payer: Self-pay | Admitting: Cardiology

## 2011-12-05 ENCOUNTER — Other Ambulatory Visit: Payer: Self-pay | Admitting: Physician Assistant

## 2011-12-05 VITALS — BP 88/48 | HR 58 | Resp 18 | Ht 64.0 in | Wt 100.0 lb

## 2011-12-05 DIAGNOSIS — E039 Hypothyroidism, unspecified: Secondary | ICD-10-CM

## 2011-12-05 DIAGNOSIS — Z01818 Encounter for other preprocedural examination: Secondary | ICD-10-CM

## 2011-12-05 DIAGNOSIS — I4891 Unspecified atrial fibrillation: Secondary | ICD-10-CM

## 2011-12-05 DIAGNOSIS — R634 Abnormal weight loss: Secondary | ICD-10-CM

## 2011-12-05 DIAGNOSIS — I480 Paroxysmal atrial fibrillation: Secondary | ICD-10-CM

## 2011-12-05 NOTE — Patient Instructions (Signed)
Decrease your Metoprolol to 12.5 mg daily  Your physician wants you to follow-up in: 6 months You will receive a reminder letter in the mail two months in advance. If you don't receive a letter, please call our office to schedule the follow-up appointment.

## 2011-12-05 NOTE — Assessment & Plan Note (Signed)
The patient is clinically euthyroid and her signs states that the patient had recent thyroid function labs drawn at her primary care physician's office

## 2011-12-05 NOTE — Assessment & Plan Note (Signed)
The patient is extremely thin.  Her son estimates that her weight is 100 pounds but she looks less than to me.  We did not weigh her today.  She arrived by wheelchair.

## 2011-12-05 NOTE — Assessment & Plan Note (Signed)
The patient has been on low-dose beta blocker.  She has had no recurrence of her paroxysmal atrial fibrillation

## 2011-12-05 NOTE — Progress Notes (Signed)
[name] Date of Birth:  [birth year] [facility] [address] [place], [place]  [address] [phone]         Fax   [phone]  History of Present Illness: This pleasant 78 year old woman is seen for a preoperative cardiac clearance office visit.  The patient has a history of a right hip  cyst which needs revision.  Her son states that the hip has dislocated at least 4 times.  She is scheduled for a revision of the right hip on 12/18/11.  The patient is seen in the office today.  She is not having any cardiac complaints.  She denies any chest pain or shortness of breath.  She has not been experiencing any palpitations or tachycardia.  Does have a past history of severe hypothyroidism and a past history of paroxysmal atrial fibrillation, both of which have been corrected.  She is now euthyroid clinically and Dr. Grandmaison Scotland has been following her for this.  The patient also is extremely emaciated.  She has lost a great deal of weight.  Apparently no medical cause for this has been determined.  Patient states that she does not eat because she is afraid that she might choke.  Her son with whom she lives has been trying to encourage her to eat more.  Current Outpatient Prescriptions  Medication Sig Dispense Refill  . albuterol (PROVENTIL HFA;VENTOLIN HFA) 108 (90 BASE) MCG/ACT inhaler Inhale 2 puffs into the lungs every 6 (six) hours as needed. Shortness of breath      . ALPRAZolam (XANAX) 0.5 MG tablet Take 0.5 mg by mouth 2 (two) times daily as needed. anxiety      . aspirin 81 MG tablet Take 81 mg by mouth daily.        Marland Kitchen azaTHIOprine (IMURAN) 50 MG tablet Take 100 mg by mouth daily. As directed      . fluticasone (FLONASE) 50 MCG/ACT nasal spray Place 2 sprays into the nose daily.  16 g  2  . isosorbide dinitrate (ISORDIL) 10 MG tablet Take 10 mg by mouth 3 (three) times daily.       Marland Kitchen levothyroxine (SYNTHROID, LEVOTHROID) 75 MCG tablet Take 75 mcg by mouth daily.          . meclizine (ANTIVERT) 25 MG tablet Take 25 mg by mouth 3 (three) times daily as needed.        . metoprolol tartrate (LOPRESSOR) 25 MG tablet Take 25 mg by mouth. 1/2 daily      . NON FORMULARY Take 65 mg by mouth 4 (four) times daily. Iron 65mg  daily- take 4 - 5 tabs daily      . traMADol (ULTRAM) 50 MG tablet Take 50-100 mg by mouth every 8 (eight) hours as needed. pain        Allergies  Allergen Reactions  . Codeine Other (See Comments)    unknown  . Morphine And Related Other (See Comments)    unknown  . Piroxicam Other (See Comments)    unknown    Patient Active Problem List  Diagnoses  . PAF (paroxysmal atrial fibrillation)  . Hypothyroidism  . Weight loss, unintentional  . Myalgia and myositis, unspecified  . Muscle weakness (generalized)  . Pre-op examination    History  Smoking status  . Never Smoker   Smokeless tobacco  . Not on file    History  Alcohol Use No    No family history on file.  Review of Systems: Constitutional: no fever  chills diaphoresis or fatigue or change in weight.  Head and neck: no hearing loss, no epistaxis, no photophobia or visual disturbance. Respiratory: No cough, shortness of breath or wheezing. Cardiovascular: No chest pain peripheral edema, palpitations. Gastrointestinal: No abdominal distention, no abdominal pain, no change in bowel habits hematochezia or melena. Genitourinary: No dysuria, no frequency, no urgency, no nocturia. Musculoskeletal:No arthralgias, no back pain, no gait disturbance or myalgias. Neurological: No dizziness, no headaches, no numbness, no seizures, no syncope, no weakness, no tremors. Hematologic: No lymphadenopathy, no easy bruising. Psychiatric: No confusion, no hallucinations, no sleep disturbance.    Physical Exam: Filed Vitals:   12/05/11 1617  BP: 88/48  Pulse: 58  Resp: 18   the general appearance reveals an extremely emaciated chronically ill-appearing elderly woman in no  distress.Pupils equal and reactive.   Extraocular Movements are full.  There is no scleral icterus.  The mouth and pharynx are normal.  The neck is supple.  The carotids reveal no bruits.  The jugular venous pressure is normal.  The thyroid is not enlarged.  There is no lymphadenopathy.  The chest is clear to percussion and auscultation. There are no rales or rhonchi. Expansion of the chest is symmetrical.  The precordium is quiet.  The first heart sound is normal.  The second heart sound is physiologically split.  There is no murmur gallop rub or click.  There is no abnormal lift or heave.  The abdomen is soft and nontender without palpable masses.  There is no peripheral edema.  Her right lower leg is in a soft cast.The skin is warm and dry.  There is no rash. Strength is normal and symmetrical in all extremities.  There is no lateralizing weakness.  There are no sensory deficits.  EKG today shows sinus bradycardia at 54 per minute and is otherwise within normal limits.  Chest x-ray done 04/26/11 showed normal heart size and no evidence of congestive heart failure    Assessment / Plan: From the cardiac standpoint the patient is stable for proposed hip surgery to be done in June 2013.  Because of her marked bradycardia and her low blood pressure we are reducing her metoprolol to just 12.5 mg daily. The patient will be rechecked here for followup office visit and EKG in 6 months

## 2011-12-10 ENCOUNTER — Encounter (HOSPITAL_COMMUNITY): Payer: Self-pay | Admitting: Pharmacy Technician

## 2011-12-13 NOTE — H&P (Signed)
[name] DOB: 20-Jan-1926  Chief Complaint: recurrent right total hip dislocations  History of Present Illness The patient is a 78 year old female who comes in today for a preoperative History and Physical. The patient is scheduled for a right hip revision to constrained liner to be performed by Dr. Gus Rankin. Aluisio, MD at [facility] on Wednesday December 18, 2011 . Candiss's right hip was about 10 years ago. She has dislocated the hip three times, twice in the last few months. Due to recurrent dislocations in the right total hip, Dr. Lequita Halt will convert the right total hip to a constrained liner. Risks and benefit of surgery discussed. PCP: Dr. Yehuda Budd Cardio: Dr. Patty Sermons    Problem List/Past MedicalHistory Osteoarthrosis NOS, pelvis/thigh (715.95). 10/23/1994 Osteoarthrosis, local, primary, pelvis/thigh (715.15). 12/07/1999 Dislocation, prosthetic joint (996.42). 11/16/2010 Migraine Headache Depression Anxiety Disorder Vertigo Asthma Gastroesophageal Reflux Disease Hypothyroidism  Allergies Codeine.  Feldene.  Morphine.   Family History Father. deceased due to MI Mother. deceased due to MI Children. DM   Social History Current work status. retired Copywriter, advertising. 4 Exercise. Exercises rarely Marital status. widowed Post-Surgical Plans. plans to go home Advance Directives. none Tobacco use Alcohol use. Never consumed alcohol.   Medication History TraMADol HCl (50MG  Tablet, 1-2 Oral every 6-8 hours as needed for pain ALPRAZolam XR (0.5MG  Tablet ER 24HR, Oral daily). Aspirin EC (81MG  Tablet DR, Oral daily)  AzaTHIOprine (50MG  Tablet, Oral daily)  Levothyroxine Sodium ( Oral)  Meclizine HCl ( Oral)  Metoprolol Tartrate ( Oral) . Isosorbide Dinitrate ( Oral)  Fluticasone Propionate ( Nasal)    Past Surgical History Total Hip Replacement. bilateral Hip Fracture and Surgery. bilateral Tonsillectomy    Review of  Systems General:Present- Fatigue and Weight Loss. Not Present- Chills, Fever, Night Sweats, Weight Gain and Memory Loss. Skin:Not Present- Hives, Itching, Rash, Eczema and Lesions. HEENT:Present- Headache. Not Present- Tinnitus, Double Vision, Visual Loss, Hearing Loss and Dentures. Respiratory:Present- Shortness of breath at rest and Allergies. Not Present- Shortness of breath with exertion, Coughing up blood and Chronic Cough. Cardiovascular:Not Present- Chest Pain, Racing/skipping heartbeats, Difficulty Breathing Lying Down, Murmur, Swelling and Palpitations. Gastrointestinal:Present- Heartburn, Vomiting, Nausea, Constipation and Difficulty Swallowing. Not Present- Bloody Stool, Abdominal Pain, Diarrhea, Jaundice and Loss of appetitie. Female Genitourinary:Present- Urinary frequency. Not Present- Blood in Urine, Weak urinary stream, Discharge, Flank Pain, Incontinence, Painful Urination, Urgency, Urinary Retention and Urinating at Night. Musculoskeletal:Present- Muscle Weakness, Muscle Pain, Joint Pain, Back Pain and Morning Stiffness. Not Present- Joint Swelling and Spasms. Neurological:Present- Dizziness. Not Present- Tremor, Blackout spells, Paralysis, Difficulty with balance and Weakness. Psychiatric:Not Present- Insomnia.   Vitals Weight: 100 lb Height: 64 in Body Surface Area: 1.43 m Body Mass Index: 17.16 kg/m Pulse: 60 (Regular) Resp.: 16 (Unlabored) BP: 102/58 (Sitting, Left Arm, Standard)    Physical Exam General Mental Status - Alert, cooperative and good historian. General Appearance- pleasant. Not in acute distress. Orientation- Oriented X3. Build & Nutrition- Underweight. Head and Neck Head- normocephalic, atraumatic . Neck Global Assessment- supple. no bruit auscultated on the right and no bruit auscultated on the left. Eye Pupil- Bilateral- Normal. Motion- Bilateral- EOMI. Chest and Lung Exam Auscultation: Breath sounds:-  clear at anterior chest wall and - clear at posterior chest wall. Adventitious sounds:- No Adventitious sounds. Cardiovascular Auscultation:Rhythm- Regular rate and rhythm. Heart Sounds- S1 WNL and S2 WNL. Murmurs & Other Heart Sounds:Auscultation of the heart reveals - No Murmurs Abdomen Palpation/Percussion:Tenderness- Abdomen is non-tender to palpation. Rigidity (guarding)- Abdomen is soft. Auscultation:Auscultation of  the abdomen reveals - Bowel sounds normal. Female Genitourinary Not done, not pertinent to present illness Peripheral Vascular Upper Extremity: Palpation:- Pulses bilaterally normal. Lower Extremity: Palpation:- Pulses bilaterally normal. Neurologic Neurologic evaluation reveals - normal sensation and upper and lower extremity deep tendon reflexes intact bilaterally . Overall Assessment of Muscle Strength and Tone reveals: Upper Extremities:Right Bicep- 3+/5. Left Bicep- 3+/5. Musculoskeletal Right hip flex to about 100, rotate in 20, out 30, abduct 30 without discomfort. Unable to make fist bialterally due to decreased ROM in hands. Dorisflexion and plantar flexion intact bilaterally.    RADIOGRAPHS: Her radiographs from the hospital show a posterior superior dislocation. The components appear to be in excellent position. She does not have any discernible wear but, given the dislocations, most likely has some wear that is not showing up on the plain films.   Assessment & Plan Dislocation, prosthetic joint, recurrent Right total hip converted to constrained liner     [place], PA-C

## 2011-12-16 ENCOUNTER — Encounter (HOSPITAL_COMMUNITY)
Admission: RE | Admit: 2011-12-16 | Discharge: 2011-12-16 | Disposition: A | Discharge status: Home / Self Care | Payer: Medicare Other | Source: Ambulatory Visit | Attending: Orthopedic Surgery | Admitting: Orthopedic Surgery

## 2011-12-16 ENCOUNTER — Encounter (HOSPITAL_COMMUNITY): Payer: Self-pay

## 2011-12-16 ENCOUNTER — Ambulatory Visit (HOSPITAL_COMMUNITY)
Admission: RE | Admit: 2011-12-16 | Discharge: 2011-12-16 | Disposition: A | Discharge status: Home / Self Care | Payer: Medicare Other | Source: Ambulatory Visit | Attending: Orthopedic Surgery | Admitting: Orthopedic Surgery

## 2011-12-16 DIAGNOSIS — Z96649 Presence of unspecified artificial hip joint: Secondary | ICD-10-CM | POA: Insufficient documentation

## 2011-12-16 DIAGNOSIS — X58XXXA Exposure to other specified factors, initial encounter: Secondary | ICD-10-CM | POA: Insufficient documentation

## 2011-12-16 DIAGNOSIS — Z01812 Encounter for preprocedural laboratory examination: Secondary | ICD-10-CM | POA: Insufficient documentation

## 2011-12-16 DIAGNOSIS — T84029A Dislocation of unspecified internal joint prosthesis, initial encounter: Secondary | ICD-10-CM | POA: Insufficient documentation

## 2011-12-16 LAB — URINALYSIS, ROUTINE W REFLEX MICROSCOPIC
Nitrite: NEGATIVE
Specific Gravity, Urine: 1.022 (ref 1.005–1.030)
pH: 5.5 (ref 5.0–8.0)

## 2011-12-16 LAB — COMPREHENSIVE METABOLIC PANEL
ALT: 8 U/L (ref 0–35)
AST: 17 U/L (ref 0–37)
Albumin: 3.3 g/dL — ABNORMAL LOW (ref 3.5–5.2)
Chloride: 99 mEq/L (ref 96–112)
Creatinine, Ser: 0.77 mg/dL (ref 0.50–1.10)
Potassium: 3.9 mEq/L (ref 3.5–5.1)
Sodium: 135 mEq/L (ref 135–145)
Total Bilirubin: 0.3 mg/dL (ref 0.3–1.2)

## 2011-12-16 LAB — PROTIME-INR: INR: 0.92 (ref 0.00–1.49)

## 2011-12-16 LAB — CBC
MCV: 107.3 fL — ABNORMAL HIGH (ref 78.0–100.0)
Platelets: 234 10*3/uL (ref 150–400)
RDW: 15.8 % — ABNORMAL HIGH (ref 11.5–15.5)
WBC: 3.4 10*3/uL — ABNORMAL LOW (ref 4.0–10.5)

## 2011-12-16 LAB — URINE MICROSCOPIC-ADD ON

## 2011-12-16 LAB — SURGICAL PCR SCREEN: Staphylococcus aureus: POSITIVE — AB

## 2011-12-16 LAB — APTT: aPTT: 35 seconds (ref 24–37)

## 2011-12-16 NOTE — Pre-Procedure Instructions (Signed)
Last office vist note dr Patty Sermons 10-10-2011 epic 12-05-2011 ekg epic  Chest 2 view dr Babette Relic spear 04-26-2011 epic cardiology clearance note 12-05-2011  Dr Patty Sermons on chart

## 2011-12-16 NOTE — Pre-Procedure Instructions (Signed)
Urine, micro, cmet, cbc results faxed to dr Lequita Halt, fax confirmation received and placed on pt chart

## 2011-12-16 NOTE — Patient Instructions (Signed)
20 CLARK CLOWDUS  12/16/2011   Your procedure is scheduled on:  12-18-2011  Report to [facility] at  1245 AM.  Call this number if you have problems the morning of surgery: [phone]   Remember:   Do not eat food :After Midnight.  . Clear liquids midnight until 0915 am, then nothing by mouth  Take these medicines the morning of surgery with A SIP OF WATER:  flonase nasal spray, albuterol inhlaer if needed and bring inhaler, xanax if needed, isosorbide dinitrate, synthroid, metoprolol tartrate    Do not wear jewelry or make up.  Do not wear lotions, powders, or perfumes.Do not wear deodorant.    Do not bring valuables to the hospital.  Contacts, dentures or bridgework may not be worn into surgery.  Leave suitcase in the car. After surgery it may be brought to your room.  For patients admitted to the hospital, checkout time is 11:00 AM the day of              discharge.     Special Instructions: CHG Shower Use Special Wash: 1/2 bottle night before surgery and 1/2 bottle morning of surgery, use regular soap on face and front and back private area.   Please read over the following fact sheets that you were given: MRSA Information, blood fact sheet, incentive spirometer fact sheet  Cain Sieve [facility] pre op nurse phone number [phone], call if needed

## 2011-12-17 NOTE — Pre-Procedure Instructions (Signed)
Pt's son called may take tramadol prn am of surgery sip of water

## 2011-12-18 ENCOUNTER — Inpatient Hospital Stay (HOSPITAL_COMMUNITY)
Admission: RE | Admit: 2011-12-18 | Discharge: 2011-12-23 | DRG: 467 | Disposition: A | Discharge status: Home Health | Payer: Medicare Other | Source: Ambulatory Visit | Attending: Orthopedic Surgery | Admitting: Orthopedic Surgery

## 2011-12-18 ENCOUNTER — Encounter (HOSPITAL_COMMUNITY)
Admission: RE | Disposition: A | Discharge status: Home Health | Payer: Self-pay | Source: Ambulatory Visit | Attending: Orthopedic Surgery

## 2011-12-18 ENCOUNTER — Encounter (HOSPITAL_COMMUNITY): Payer: Self-pay | Admitting: Anesthesiology

## 2011-12-18 ENCOUNTER — Encounter (HOSPITAL_COMMUNITY): Payer: Self-pay | Admitting: *Deleted

## 2011-12-18 ENCOUNTER — Ambulatory Visit (HOSPITAL_COMMUNITY): Payer: Medicare Other | Admitting: Anesthesiology

## 2011-12-18 ENCOUNTER — Ambulatory Visit (HOSPITAL_COMMUNITY): Payer: Medicare Other

## 2011-12-18 DIAGNOSIS — E039 Hypothyroidism, unspecified: Secondary | ICD-10-CM | POA: Diagnosis present

## 2011-12-18 DIAGNOSIS — I4891 Unspecified atrial fibrillation: Secondary | ICD-10-CM | POA: Diagnosis present

## 2011-12-18 DIAGNOSIS — R636 Underweight: Secondary | ICD-10-CM | POA: Diagnosis present

## 2011-12-18 DIAGNOSIS — Z96649 Presence of unspecified artificial hip joint: Secondary | ICD-10-CM

## 2011-12-18 DIAGNOSIS — Z9289 Personal history of other medical treatment: Secondary | ICD-10-CM

## 2011-12-18 DIAGNOSIS — F411 Generalized anxiety disorder: Secondary | ICD-10-CM | POA: Diagnosis present

## 2011-12-18 DIAGNOSIS — E876 Hypokalemia: Secondary | ICD-10-CM | POA: Diagnosis not present

## 2011-12-18 DIAGNOSIS — X58XXXA Exposure to other specified factors, initial encounter: Secondary | ICD-10-CM

## 2011-12-18 DIAGNOSIS — D62 Acute posthemorrhagic anemia: Secondary | ICD-10-CM

## 2011-12-18 DIAGNOSIS — Z681 Body mass index (BMI) 19 or less, adult: Secondary | ICD-10-CM

## 2011-12-18 DIAGNOSIS — Y92009 Unspecified place in unspecified non-institutional (private) residence as the place of occurrence of the external cause: Secondary | ICD-10-CM

## 2011-12-18 DIAGNOSIS — T84029A Dislocation of unspecified internal joint prosthesis, initial encounter: Principal | ICD-10-CM | POA: Diagnosis present

## 2011-12-18 HISTORY — PX: TOTAL HIP REVISION: SHX763

## 2011-12-18 SURGERY — TOTAL HIP REVISION
Anesthesia: Spinal | Site: Hip | Laterality: Right | Wound class: Clean

## 2011-12-18 MED ORDER — ALBUTEROL SULFATE HFA 108 (90 BASE) MCG/ACT IN AERS
2.0000 | INHALATION_SPRAY | Freq: Four times a day (QID) | RESPIRATORY_TRACT | Status: DC | PRN
  Filled 2011-12-18: qty 6.7

## 2011-12-18 MED ORDER — BUPIVACAINE LIPOSOME 1.3 % IJ SUSP
20.0000 mL | Freq: Once | INTRAMUSCULAR | Status: DC
  Filled 2011-12-18: qty 20

## 2011-12-18 MED ORDER — DIPHENHYDRAMINE HCL 12.5 MG/5ML PO ELIX: 12.5000 mg | ORAL_SOLUTION | ORAL | Status: DC | PRN

## 2011-12-18 MED ORDER — ONDANSETRON HCL 4 MG PO TABS: 4.0000 mg | ORAL_TABLET | Freq: Four times a day (QID) | ORAL | Status: DC | PRN

## 2011-12-18 MED ORDER — SODIUM CHLORIDE 0.9 % IV SOLN: INTRAVENOUS | Status: DC

## 2011-12-18 MED ORDER — HYDROMORPHONE HCL 2 MG PO TABS: 2.0000 mg | ORAL_TABLET | ORAL | Status: DC | PRN

## 2011-12-18 MED ORDER — CEFAZOLIN SODIUM-DEXTROSE 2-3 GM-% IV SOLR
INTRAVENOUS | Status: AC
  Filled 2011-12-18: qty 50

## 2011-12-18 MED ORDER — HYDROMORPHONE HCL PF 1 MG/ML IJ SOLN
0.5000 mg | INTRAMUSCULAR | Status: DC | PRN
  Administered 2011-12-18 – 2011-12-19 (×2): 0.5 mg via INTRAVENOUS
  Filled 2011-12-18 (×2): qty 1

## 2011-12-18 MED ORDER — FLEET ENEMA 7-19 GM/118ML RE ENEM: 1.0000 | ENEMA | Freq: Once | RECTAL | Status: AC | PRN

## 2011-12-18 MED ORDER — LACTATED RINGERS IV SOLN: INTRAVENOUS | Status: DC

## 2011-12-18 MED ORDER — TRAMADOL HCL 50 MG PO TABS
50.0000 mg | ORAL_TABLET | Freq: Four times a day (QID) | ORAL | Status: DC | PRN
  Administered 2011-12-19: 50 mg via ORAL
  Administered 2011-12-20 – 2011-12-23 (×6): 100 mg via ORAL
  Filled 2011-12-18 (×3): qty 2
  Filled 2011-12-18: qty 1
  Filled 2011-12-18 (×3): qty 2

## 2011-12-18 MED ORDER — METHOCARBAMOL 100 MG/ML IJ SOLN
500.0000 mg | Freq: Four times a day (QID) | INTRAMUSCULAR | Status: DC | PRN
  Administered 2011-12-18: 500 mg via INTRAVENOUS
  Filled 2011-12-18 (×2): qty 5

## 2011-12-18 MED ORDER — MENTHOL 3 MG MT LOZG
1.0000 | LOZENGE | OROMUCOSAL | Status: DC | PRN
  Filled 2011-12-18 (×2): qty 9

## 2011-12-18 MED ORDER — ACETAMINOPHEN 10 MG/ML IV SOLN
INTRAVENOUS | Status: DC | PRN
  Administered 2011-12-18: 1000 mg via INTRAVENOUS

## 2011-12-18 MED ORDER — DOCUSATE SODIUM 100 MG PO CAPS
100.0000 mg | ORAL_CAPSULE | Freq: Two times a day (BID) | ORAL | Status: DC
  Administered 2011-12-18 – 2011-12-23 (×9): 100 mg via ORAL

## 2011-12-18 MED ORDER — ONDANSETRON HCL 4 MG/2ML IJ SOLN
4.0000 mg | Freq: Four times a day (QID) | INTRAMUSCULAR | Status: DC | PRN
  Administered 2011-12-18: 4 mg via INTRAVENOUS
  Filled 2011-12-18: qty 2

## 2011-12-18 MED ORDER — ACETAMINOPHEN 10 MG/ML IV SOLN
INTRAVENOUS | Status: AC
Start: 2011-12-18 — End: 2011-12-18
  Filled 2011-12-18: qty 100

## 2011-12-18 MED ORDER — METOPROLOL TARTRATE 12.5 MG HALF TABLET
12.5000 mg | ORAL_TABLET | Freq: Every morning | ORAL | Status: DC
  Administered 2011-12-19 – 2011-12-23 (×5): 12.5 mg via ORAL
  Filled 2011-12-18 (×5): qty 1

## 2011-12-18 MED ORDER — TEMAZEPAM 15 MG PO CAPS: 15.0000 mg | ORAL_CAPSULE | Freq: Every evening | ORAL | Status: DC | PRN

## 2011-12-18 MED ORDER — ACETAMINOPHEN 10 MG/ML IV SOLN
1000.0000 mg | Freq: Once | INTRAVENOUS | Status: DC
  Filled 2011-12-18: qty 100

## 2011-12-18 MED ORDER — ALPRAZOLAM 0.5 MG PO TABS
0.5000 mg | ORAL_TABLET | Freq: Two times a day (BID) | ORAL | Status: DC | PRN
Start: 2011-12-18 — End: 2011-12-23
  Administered 2011-12-19 – 2011-12-23 (×8): 0.5 mg via ORAL
  Filled 2011-12-18 (×8): qty 1

## 2011-12-18 MED ORDER — FERROUS SULFATE 325 (65 FE) MG PO TABS
325.0000 mg | ORAL_TABLET | Freq: Every day | ORAL | Status: DC
  Administered 2011-12-19 – 2011-12-23 (×5): 325 mg via ORAL
  Filled 2011-12-18 (×6): qty 1

## 2011-12-18 MED ORDER — ACETAMINOPHEN 10 MG/ML IV SOLN
1000.0000 mg | Freq: Four times a day (QID) | INTRAVENOUS | Status: AC
  Administered 2011-12-18 – 2011-12-19 (×4): 1000 mg via INTRAVENOUS
  Filled 2011-12-18 (×6): qty 100

## 2011-12-18 MED ORDER — METHOCARBAMOL 500 MG PO TABS
500.0000 mg | ORAL_TABLET | Freq: Four times a day (QID) | ORAL | Status: DC | PRN
  Administered 2011-12-20 (×4): 500 mg via ORAL
  Filled 2011-12-18 (×4): qty 1

## 2011-12-18 MED ORDER — FENTANYL CITRATE 0.05 MG/ML IJ SOLN
INTRAMUSCULAR | Status: DC | PRN
  Administered 2011-12-18 (×2): 50 ug via INTRAVENOUS

## 2011-12-18 MED ORDER — CEFAZOLIN SODIUM-DEXTROSE 2-3 GM-% IV SOLR
2.0000 g | INTRAVENOUS | Status: AC
  Administered 2011-12-18: 1 g via INTRAVENOUS

## 2011-12-18 MED ORDER — METOCLOPRAMIDE HCL 5 MG/ML IJ SOLN
5.0000 mg | Freq: Three times a day (TID) | INTRAMUSCULAR | Status: DC | PRN
  Administered 2011-12-20: 10 mg via INTRAVENOUS
  Filled 2011-12-18: qty 2

## 2011-12-18 MED ORDER — MECLIZINE HCL 25 MG PO TABS
25.0000 mg | ORAL_TABLET | Freq: Three times a day (TID) | ORAL | Status: DC | PRN
  Filled 2011-12-18: qty 1

## 2011-12-18 MED ORDER — 0.9 % SODIUM CHLORIDE (POUR BTL) OPTIME
TOPICAL | Status: DC | PRN
  Administered 2011-12-18: 1000 mL

## 2011-12-18 MED ORDER — HYDROMORPHONE HCL PF 1 MG/ML IJ SOLN: 0.2500 mg | INTRAMUSCULAR | Status: DC | PRN

## 2011-12-18 MED ORDER — BUPIVACAINE LIPOSOME 1.3 % IJ SUSP
INTRAMUSCULAR | Status: DC | PRN
  Administered 2011-12-18: 20 mL

## 2011-12-18 MED ORDER — FLUTICASONE PROPIONATE 50 MCG/ACT NA SUSP
2.0000 | Freq: Every day | NASAL | Status: DC
  Administered 2011-12-19 – 2011-12-23 (×4): 2 via NASAL
  Filled 2011-12-18: qty 16

## 2011-12-18 MED ORDER — ACETAMINOPHEN 650 MG RE SUPP: 650.0000 mg | Freq: Four times a day (QID) | RECTAL | Status: DC | PRN

## 2011-12-18 MED ORDER — LACTATED RINGERS IV SOLN
INTRAVENOUS | Status: DC
  Administered 2011-12-18: 1000 mL via INTRAVENOUS
  Administered 2011-12-18: 17:00:00 via INTRAVENOUS

## 2011-12-18 MED ORDER — ISOSORBIDE DINITRATE 10 MG PO TABS
10.0000 mg | ORAL_TABLET | Freq: Three times a day (TID) | ORAL | Status: DC
  Administered 2011-12-18 – 2011-12-23 (×14): 10 mg via ORAL
  Filled 2011-12-18 (×16): qty 1

## 2011-12-18 MED ORDER — PROPOFOL 10 MG/ML IV BOLUS
INTRAVENOUS | Status: DC | PRN
  Administered 2011-12-18 (×2): 20 mg via INTRAVENOUS

## 2011-12-18 MED ORDER — ACETAMINOPHEN 325 MG PO TABS
650.0000 mg | ORAL_TABLET | Freq: Four times a day (QID) | ORAL | Status: DC | PRN
  Administered 2011-12-21 – 2011-12-22 (×2): 650 mg via ORAL
  Filled 2011-12-18 (×3): qty 2

## 2011-12-18 MED ORDER — BISACODYL 10 MG RE SUPP
10.0000 mg | Freq: Every day | RECTAL | Status: DC | PRN
  Filled 2011-12-18: qty 1

## 2011-12-18 MED ORDER — CEFAZOLIN SODIUM 1-5 GM-% IV SOLN
1.0000 g | Freq: Four times a day (QID) | INTRAVENOUS | Status: AC
  Administered 2011-12-18 – 2011-12-19 (×3): 1 g via INTRAVENOUS
  Filled 2011-12-18 (×3): qty 50

## 2011-12-18 MED ORDER — BUPIVACAINE HCL 0.75 % IJ SOLN
INTRAMUSCULAR | Status: DC | PRN
  Administered 2011-12-18: 12 mg via INTRATHECAL

## 2011-12-18 MED ORDER — RIVAROXABAN 10 MG PO TABS
10.0000 mg | ORAL_TABLET | Freq: Every day | ORAL | Status: DC
  Administered 2011-12-19 – 2011-12-23 (×5): 10 mg via ORAL
  Filled 2011-12-18 (×6): qty 1

## 2011-12-18 MED ORDER — PROPOFOL 10 MG/ML IV EMUL
INTRAVENOUS | Status: DC | PRN
  Administered 2011-12-18: 140 ug/kg/min via INTRAVENOUS

## 2011-12-18 MED ORDER — PHENOL 1.4 % MT LIQD
1.0000 | OROMUCOSAL | Status: DC | PRN
  Filled 2011-12-18: qty 177

## 2011-12-18 MED ORDER — CEFAZOLIN SODIUM 1-5 GM-% IV SOLN
INTRAVENOUS | Status: AC
  Filled 2011-12-18: qty 50

## 2011-12-18 MED ORDER — KCL IN DEXTROSE-NACL 20-5-0.9 MEQ/L-%-% IV SOLN
INTRAVENOUS | Status: DC
  Administered 2011-12-18 – 2011-12-19 (×2): via INTRAVENOUS
  Administered 2011-12-20: 20 mL/h via INTRAVENOUS
  Filled 2011-12-18 (×4): qty 1000

## 2011-12-18 MED ORDER — METOCLOPRAMIDE HCL 10 MG PO TABS: 5.0000 mg | ORAL_TABLET | Freq: Three times a day (TID) | ORAL | Status: DC | PRN

## 2011-12-18 MED ORDER — SODIUM CHLORIDE 0.9 % IV SOLN
INTRAVENOUS | Status: DC | PRN
  Administered 2011-12-18 (×2): via INTRAVENOUS

## 2011-12-18 MED ORDER — LEVOTHYROXINE SODIUM 75 MCG PO TABS
75.0000 ug | ORAL_TABLET | Freq: Every day | ORAL | Status: DC
  Administered 2011-12-19 – 2011-12-23 (×5): 75 ug via ORAL
  Filled 2011-12-18 (×6): qty 1

## 2011-12-18 MED ORDER — POLYETHYLENE GLYCOL 3350 17 G PO PACK: 17.0000 g | PACK | Freq: Every day | ORAL | Status: DC | PRN

## 2011-12-18 SURGICAL SUPPLY — 60 items
BAG SPEC THK2 15X12 ZIP CLS (MISCELLANEOUS) ×3
BAG ZIPLOCK 12X15 (MISCELLANEOUS) ×6 IMPLANT
BIT DRILL 2.8X128 (BIT) ×2 IMPLANT
BLADE EXTENDED COATED 6.5IN (ELECTRODE) ×2 IMPLANT
BLADE SAW SAG 73X25 THK (BLADE) ×1
BLADE SAW SGTL 73X25 THK (BLADE) ×1 IMPLANT
CATH KIT ON-Q SILVERSOAK 5 (CATHETERS) ×1 IMPLANT
CATH KIT ON-Q SILVERSOAK 5IN (CATHETERS) ×2 IMPLANT
CLOTH BEACON ORANGE TIMEOUT ST (SAFETY) ×2 IMPLANT
CLSR STERI-STRIP ANTIMIC 1/2X4 (GAUZE/BANDAGES/DRESSINGS) ×1 IMPLANT
CONT SPECI 4OZ STER CLIK (MISCELLANEOUS) IMPLANT
DRAPE INCISE IOBAN 66X45 STRL (DRAPES) ×2 IMPLANT
DRAPE ORTHO SPLIT 77X108 STRL (DRAPES) ×4
DRAPE POUCH INSTRU U-SHP 10X18 (DRAPES) ×2 IMPLANT
DRAPE SURG ORHT 6 SPLT 77X108 (DRAPES) ×2 IMPLANT
DRAPE U-SHAPE 47X51 STRL (DRAPES) ×2 IMPLANT
DRSG ADAPTIC 3X8 NADH LF (GAUZE/BANDAGES/DRESSINGS) ×1 IMPLANT
DRSG EMULSION OIL 3X16 NADH (GAUZE/BANDAGES/DRESSINGS) ×2 IMPLANT
DRSG MEPILEX BORDER 4X4 (GAUZE/BANDAGES/DRESSINGS) ×3 IMPLANT
DRSG MEPILEX BORDER 4X8 (GAUZE/BANDAGES/DRESSINGS) ×2 IMPLANT
DURAPREP 26ML APPLICATOR (WOUND CARE) ×2 IMPLANT
ELECT REM PT RETURN 9FT ADLT (ELECTROSURGICAL) ×2
ELECTRODE REM PT RTRN 9FT ADLT (ELECTROSURGICAL) ×1 IMPLANT
EVACUATOR 1/8 PVC DRAIN (DRAIN) ×2 IMPLANT
FACESHIELD LNG OPTICON STERILE (SAFETY) ×8 IMPLANT
GLOVE BIO SURGEON STRL SZ7.5 (GLOVE) ×2 IMPLANT
GLOVE BIO SURGEON STRL SZ8 (GLOVE) ×2 IMPLANT
GLOVE BIOGEL PI IND STRL 8 (GLOVE) ×2 IMPLANT
GLOVE BIOGEL PI INDICATOR 8 (GLOVE) ×2
GOWN STRL NON-REIN LRG LVL3 (GOWN DISPOSABLE) ×6 IMPLANT
GOWN STRL REIN XL XLG (GOWN DISPOSABLE) ×2 IMPLANT
HEAD FEM SROM 28 +6 (Hips) ×1 IMPLANT
IMMOBILIZER KNEE 20 (SOFTGOODS) ×2
IMMOBILIZER KNEE 20 THIGH 36 (SOFTGOODS) IMPLANT
KIT BASIN OR (CUSTOM PROCEDURE TRAY) ×2 IMPLANT
LINER ENDURON 50 ODX26 ID 4 (Hips) ×1 IMPLANT
MANIFOLD NEPTUNE II (INSTRUMENTS) ×2 IMPLANT
NDL SAFETY ECLIPSE 18X1.5 (NEEDLE) IMPLANT
NEEDLE HYPO 18GX1.5 SHARP (NEEDLE)
NS IRRIG 1000ML POUR BTL (IV SOLUTION) ×2 IMPLANT
PACK TOTAL JOINT (CUSTOM PROCEDURE TRAY) ×2 IMPLANT
PASSER SUT SWANSON 36MM LOOP (INSTRUMENTS) ×2 IMPLANT
POSITIONER SURGICAL ARM (MISCELLANEOUS) ×2 IMPLANT
RING LOCK ACET OD 50 (Hips) ×1 IMPLANT
SPONGE GAUZE 4X4 12PLY (GAUZE/BANDAGES/DRESSINGS) ×2 IMPLANT
SPONGE LAP 18X18 X RAY DECT (DISPOSABLE) ×2 IMPLANT
STAPLER VISISTAT 35W (STAPLE) ×2 IMPLANT
SUCTION FRAZIER TIP 10 FR DISP (SUCTIONS) ×2 IMPLANT
SUT ETHIBOND NAB CT1 #1 30IN (SUTURE) ×4 IMPLANT
SUT VIC AB 1 CT1 27 (SUTURE)
SUT VIC AB 1 CT1 27XBRD ANTBC (SUTURE) ×3 IMPLANT
SUT VIC AB 2-0 CT1 27 (SUTURE) ×6
SUT VIC AB 2-0 CT1 TAPERPNT 27 (SUTURE) ×3 IMPLANT
SUT VLOC 180 0 24IN GS25 (SUTURE) ×3 IMPLANT
SWAB COLLECTION DEVICE MRSA (MISCELLANEOUS) ×1 IMPLANT
SYR 50ML LL SCALE MARK (SYRINGE) ×1 IMPLANT
TOWEL OR 17X26 10 PK STRL BLUE (TOWEL DISPOSABLE) ×4 IMPLANT
TRAY FOLEY CATH 14FRSI W/METER (CATHETERS) ×2 IMPLANT
TUBE ANAEROBIC SPECIMEN COL (MISCELLANEOUS) IMPLANT
WATER STERILE IRR 1500ML POUR (IV SOLUTION) ×2 IMPLANT

## 2011-12-18 NOTE — Anesthesia Postprocedure Evaluation (Signed)
  Anesthesia Post-op Note  Patient: [name]  Procedure(s) Performed: Procedure(s) (LRB): TOTAL HIP REVISION (Right)  Patient Location: PACU  Anesthesia Type: Spinal  Level of Consciousness: awake and alert   Airway and Oxygen Therapy: Patient Spontanous Breathing  Post-op Pain: mild  Post-op Assessment: Post-op Vital signs reviewed, Patient's Cardiovascular Status Stable, Respiratory Function Stable, Patent Airway and No signs of Nausea or vomiting  Post-op Vital Signs: stable  Complications: No apparent anesthesia complications

## 2011-12-18 NOTE — Anesthesia Procedure Notes (Signed)
Spinal  Patient location during procedure: OR Start time: 12/18/2011 4:45 PM End time: 12/18/2011 4:52 PM Staffing Anesthesiologist: Ronelle Nigh L Performed by: anesthesiologist  Preanesthetic Checklist Completed: patient identified, site marked, surgical consent, pre-op evaluation, timeout performed, IV checked, risks and benefits discussed and monitors and equipment checked Spinal Block Patient position: sitting Prep: Betadine Patient monitoring: heart rate, continuous pulse ox and blood pressure Approach: right paramedian Location: L3-4 Injection technique: single-shot Needle Needle type: Spinocan  Needle gauge: 22 G Needle length: 9 cm Assessment Sensory level: T6 Additional Notes Expiration date of kit checked and confirmed. Patient tolerated procedure well, without complications.

## 2011-12-18 NOTE — Brief Op Note (Signed)
12/18/2011  5:54 PM  PATIENT:  [name]  78 y.o. female  PRE-OPERATIVE DIAGNOSIS:  Reccurrent dislocation of a right total hip arthroplasty  POST-OPERATIVE DIAGNOSIS:  Reccurrent dislocation of a right totalhip arthroplasty  PROCEDURE:  Procedure(s) (LRB): Right acetabular liner revision  SURGEON:  Surgeon(s) and Role:    * Loanne Drilling, MD - Primary  PHYSICIAN ASSISTANT:   ASSISTANTS: Avel Peace, PA-C   ANESTHESIA:   spinal  EBL:  Total I/O In: 1350 [I.V.:1000; Blood:350] Out: 300 [Urine:200; Blood:100]  BLOOD ADMINISTERED:none  DRAINS: (Medium) Hemovact drain(s) in the right hip with  Suction Open   LOCAL MEDICATIONS USED:  OTHER Exparel  DICTATION: .Other Dictation: Dictation Number [id number]  PLAN OF CARE: Admit to inpatient   PATIENT DISPOSITION:  PACU - hemodynamically stable.  Gus Rankin Shelia Kingsberry, MD    12/18/2011, 6:00 PM

## 2011-12-18 NOTE — Transfer of Care (Signed)
Immediate Anesthesia Transfer of Care Note  Patient: [name]  Procedure(s) Performed: Procedure(s) (LRB): TOTAL HIP REVISION (Right)  Patient Location: PACU  Anesthesia Type: Spinal  Level of Consciousness: awake, alert  and oriented  Airway & Oxygen Therapy: Patient Spontanous Breathing and Patient connected to nasal cannula oxygen  Post-op Assessment: Report given to PACU RN  Post vital signs: Reviewed and stable  Complications: No apparent anesthesia complications

## 2011-12-18 NOTE — Anesthesia Preprocedure Evaluation (Signed)
Anesthesia Evaluation  Patient identified by MRN, date of birth, ID band Patient awake    Reviewed: Allergy & Precautions, H&P , NPO status , Patient's Chart, lab work & pertinent test results, reviewed documented beta blocker date and time   Airway Mallampati: II TM Distance: >3 FB Neck ROM: full    Dental  (+) Missing and Dental Advisory Given,    Pulmonary shortness of breath and with exertion,  breath sounds clear to auscultation  Pulmonary exam normal       Cardiovascular Exercise Tolerance: Poor + DOE + dysrhythmias Atrial Fibrillation Rhythm:regular Rate:Normal  LVH.  Hx. A fib but NSR now.   Neuro/Psych  Headaches, Anxiety negative neurological ROS  negative psych ROS   GI/Hepatic negative GI ROS, Neg liver ROS,   Endo/Other  negative endocrine ROSHypothyroidism   Renal/GU negative Renal ROS  negative genitourinary   Musculoskeletal   Abdominal   Peds  Hematology negative hematology ROS (+)   Anesthesia Other Findings   Reproductive/Obstetrics negative OB ROS                           Anesthesia Physical Anesthesia Plan  ASA: III  Anesthesia Plan: Spinal   Post-op Pain Management:    Induction:   Airway Management Planned: Simple Face Mask  Additional Equipment:   Intra-op Plan:   Post-operative Plan:   Informed Consent: I have reviewed the patients History and Physical, chart, labs and discussed the procedure including the risks, benefits and alternatives for the proposed anesthesia with the patient or authorized representative who has indicated his/her understanding and acceptance.   Dental Advisory Given  Plan Discussed with: CRNA and Surgeon  Anesthesia Plan Comments:         Anesthesia Quick Evaluation

## 2011-12-19 ENCOUNTER — Encounter (HOSPITAL_COMMUNITY): Payer: Self-pay | Admitting: *Deleted

## 2011-12-19 DIAGNOSIS — Z9289 Personal history of other medical treatment: Secondary | ICD-10-CM

## 2011-12-19 DIAGNOSIS — D62 Acute posthemorrhagic anemia: Secondary | ICD-10-CM

## 2011-12-19 LAB — BASIC METABOLIC PANEL
BUN: 16 mg/dL (ref 6–23)
Calcium: 8.6 mg/dL (ref 8.4–10.5)
Creatinine, Ser: 0.61 mg/dL (ref 0.50–1.10)
GFR calc Af Amer: >90 mL/min (ref >90)
GFR calc non Af Amer: 81 mL/min — ABNORMAL LOW (ref >90)
Glucose, Bld: 98 mg/dL (ref 70–99)

## 2011-12-19 LAB — CBC
HCT: 23.4 % — ABNORMAL LOW (ref 36.0–46.0)
Hemoglobin: 8.2 g/dL — ABNORMAL LOW (ref 12.0–15.0)
MCH: 36.3 pg — ABNORMAL HIGH (ref 26.0–34.0)
MCHC: 35 g/dL (ref 30.0–36.0)
MCV: 103.5 fL — ABNORMAL HIGH (ref 78.0–100.0)
RDW: 16.5 % — ABNORMAL HIGH (ref 11.5–15.5)

## 2011-12-19 LAB — PREPARE RBC (CROSSMATCH)

## 2011-12-19 MED ORDER — MUPIROCIN 2 % EX OINT
TOPICAL_OINTMENT | Freq: Two times a day (BID) | CUTANEOUS | Status: DC
  Administered 2011-12-19 – 2011-12-22 (×6): via NASAL
  Administered 2011-12-23: 1 via NASAL
  Filled 2011-12-19: qty 22

## 2011-12-19 MED ORDER — FUROSEMIDE 10 MG/ML IJ SOLN
10.0000 mg | Freq: Once | INTRAMUSCULAR | Status: AC
  Administered 2011-12-19: 10 mg via INTRAVENOUS
  Filled 2011-12-19 (×2): qty 1

## 2011-12-19 NOTE — Progress Notes (Signed)
Physical Therapy Treatment Patient Details Name: [name] MRN: [id number] DOB: 04/04/1926 Today's Date: 12/19/2011 Time: 3086-5784 PT Time Calculation (min): 15 min  PT Assessment / Plan / Recommendation Comments on Treatment Session       Follow Up Recommendations  Home health PT;Skilled nursing facility    Barriers to Discharge        Equipment Recommendations  None recommended by PT    Recommendations for Other Services OT consult  Frequency 7X/week   Plan Discharge plan remains appropriate    Precautions / Restrictions Precautions Precautions: Posterior Hip Precaution Comments: pt recalls 1/3 THR without cues Restrictions Weight Bearing Restrictions: No RLE Weight Bearing: Weight bearing as tolerated   Pertinent Vitals/Pain     Mobility  Bed Mobility Bed Mobility: Sit to Supine Supine to Sit: 3: Mod assist Sit to Supine: 1: +2 Total assist Sit to Supine: Patient Percentage: 60% Details for Bed Mobility Assistance: cues for sequence and THP Transfers Transfers: Sit to Stand;Stand to Sit Sit to Stand: 1: +2 Total assist Sit to Stand: Patient Percentage: 50% Stand to Sit: 1: +2 Total assist Stand to Sit: Patient Percentage: 50% Details for Transfer Assistance: cues for use of UEs and for LE management and adherence to THP Ambulation/Gait Ambulation/Gait Assistance: 1: +2 Total assist Ambulation/Gait: Patient Percentage: 50% Ambulation Distance (Feet): 4 Feet Assistive device: Rolling walker Ambulation/Gait Assistance Details: Cues for posture, sequence, position from RW and increased UE WB Gait Pattern: Step-to pattern;Decreased step length - right;Decreased step length - left;Decreased stance time - right    Exercises Total Joint Exercises Ankle Circles/Pumps: AROM;10 reps;Supine;Both Quad Sets: AROM;Both;10 reps;Supine Heel Slides: AAROM;10 reps;Right;Supine Hip ABduction/ADduction: AAROM;10 reps;Right;Supine   PT Diagnosis: Difficulty walking  PT  Problem List: Decreased strength;Decreased range of motion;Decreased activity tolerance;Decreased mobility;Decreased balance;Decreased knowledge of use of DME;Decreased safety awareness;Pain;Decreased knowledge of precautions PT Treatment Interventions: DME instruction;Gait training;Stair training;Functional mobility training;Therapeutic activities;Therapeutic exercise;Patient/family education   PT Goals Acute Rehab PT Goals PT Goal Formulation: With patient Time For Goal Achievement: 12/24/11 Potential to Achieve Goals: Fair Pt will go Supine/Side to Sit: with supervision PT Goal: Supine/Side to Sit - Progress: Goal set today Pt will go Sit to Supine/Side: with supervision PT Goal: Sit to Supine/Side - Progress: Goal set today Pt will go Sit to Stand: with supervision PT Goal: Sit to Stand - Progress: Goal set today Pt will go Stand to Sit: with supervision PT Goal: Stand to Sit - Progress: Goal set today Pt will Ambulate: 51 - 150 feet;with supervision;with rolling walker PT Goal: Ambulate - Progress: Goal set today Pt will Go Up / Down Stairs: 3-5 stairs;with min assist;with least restrictive assistive device PT Goal: Up/Down Stairs - Progress: Goal set today  Visit Information  Last PT Received On: 12/19/11 Assistance Needed: +2    Subjective Data  Subjective: I'm just shaky - it makes me scared I might fall Patient Stated Goal: Home   Cognition  Overall Cognitive Status: Appears within functional limits for tasks assessed/performed Arousal/Alertness: Awake/alert Orientation Level: Appears intact for tasks assessed Behavior During Session: [facility] for tasks performed    Balance     End of Session PT - End of Session Equipment Utilized During Treatment: Gait belt Activity Tolerance: Patient limited by pain;Patient limited by fatigue Patient left: with call bell/phone within reach;in bed;with nursing in room Nurse Communication: Mobility status    [name] 12/19/2011,  4:26 PM

## 2011-12-19 NOTE — Progress Notes (Signed)
Subjective: 1 Day Post-Op Procedure(s) (LRB): TOTAL HIP REVISION (Right) Patient reports pain as mild and moderate.   Patient seen in rounds with Dr. Lequita Halt. Patient is well, but has had some minor complaints of pain in the hip, requiring pain medications We will start therapy today.  Plan is to go Home after hospital stay.  Objective: Vital signs in last 24 hours: Temp:  [96.3 F (35.7 C)-98 F (36.7 C)] 97.9 F (36.6 C) (06/13 0550) Pulse Rate:  [46-58] 54  (06/13 0550) Resp:  [12-20] 14  (06/13 0550) BP: (95-160)/(59-117) 95/59 mmHg (06/13 0550) SpO2:  [98 %-100 %] 100 % (06/13 0550) Weight:  [44.453 kg (98 lb)] 44.453 kg (98 lb) (06/12 2000)  Intake/Output from previous day:  Intake/Output Summary (Last 24 hours) at 12/19/11 0821 Last data filed at 12/19/11 0600  Gross per 24 hour  Intake 2921.25 ml  Output   1935 ml  Net 986.25 ml    Intake/Output this shift: UOP 900 since MN  Labs:  Basename 12/19/11 0501 12/16/11 1400  HGB 8.2* 9.3*    Basename 12/19/11 0501 12/16/11 1400  WBC 3.6* 3.4*  RBC 2.26* 2.62*  HCT 23.4* 28.1*  PLT 141* 234    Basename 12/19/11 0501 12/16/11 1400  NA 135 135  K 4.0 3.9  CL 103 99  CO2 26 26  BUN 16 24*  CREATININE 0.61 0.77  GLUCOSE 98 81  CALCIUM 8.6 9.4    Basename 12/16/11 1400  LABPT --  INR 0.92    EXAM General - Patient is Alert, Appropriate and Oriented Extremity - Neurovascular intact Sensation intact distally Dorsiflexion/Plantar flexion intact Compartment soft Dressing - dressing C/D/I Motor Function - intact, moving foot and toes well on exam.  Hemovac pulled without difficulty.  Past Medical History  Diagnosis Date  . Hypothyroidism   . Atrial fibrillation   . Myositis     BODY  . LVH (left ventricular hypertrophy)     MILD  . Dizziness   . Headache   . Weakness   . Chest pain   . DOE (dyspnea on exertion)   . Poor appetite   . UTI (urinary tract infection)   . Dysphagia     History  of dysphagia 2, depressed by esophagus status post dilation  . Arthritis   . Hypokalemia   . History of anxiety   . Orthostatic hypotension   . Inclusion body myositis   . Easy bruising   . Complication of anesthesia     anxiety before anesthesia, wants spinal    Assessment/Plan: 1 Day Post-Op Procedure(s) (LRB): TOTAL HIP REVISION (Right) Active Problems:  Postop Acute blood loss anemia  Postop Transfusion   Advance diet Up with therapy Plan for discharge tomorrow Discharge home with home health  DVT Prophylaxis - Xarelto Weight Bearing as tolerated right Leg D/C Knee Immobilizer Hemovac Pulled Begin Therapy Hip Preacutions No vaccines.  [name] 12/19/2011, 8:21 AM

## 2011-12-19 NOTE — Evaluation (Signed)
Physical Therapy Evaluation Patient Details Name: [name] MRN: [id number] DOB: 1926/02/12 Today's Date: 12/19/2011 Time: 4098-1191 PT Time Calculation (min): 32 min  PT Assessment / Plan / Recommendation Clinical Impression  Pt with R THR revision  and general premorbid deconditioning presents with decreased R LE strength/ROM and significantly limited functional mobility.,     PT Assessment  Patient needs continued PT services    Follow Up Recommendations  Home health PT;Skilled nursing facility (dependent on acute stay progress)    Barriers to Discharge        lEquipment Recommendations  None recommended by PT    Recommendations for Other Services OT consult   Frequency 7X/week    Precautions / Restrictions Precautions Precautions: Posterior Hip Precaution Comments: Pt unable to state any precautions without cues Restrictions Weight Bearing Restrictions: No RLE Weight Bearing: Weight bearing as tolerated   Pertinent Vitals/Pain Min pain at rest; 6/10 with activity; RN aware; ice pack provided      Mobility  Bed Mobility Bed Mobility: Supine to Sit Supine to Sit: 3: Mod assist Details for Bed Mobility Assistance: cues for sequence and THP Transfers Transfers: Sit to Stand;Stand to Sit Sit to Stand: 1: +2 Total assist Sit to Stand: Patient Percentage: 50% Stand to Sit: 1: +2 Total assist Stand to Sit: Patient Percentage: 50% Details for Transfer Assistance: cues for use of UEs and for LE management and adherence to THP Ambulation/Gait Ambulation/Gait Assistance: 1: +2 Total assist Ambulation/Gait: Patient Percentage: 50% Ambulation Distance (Feet): 4 Feet Assistive device: Rolling walker Ambulation/Gait Assistance Details: cues for sequence, posture, position from RW, and ER on R Gait Pattern: Step-to pattern;Decreased step length - right;Decreased step length - left;Decreased stance time - right    Exercises Total Joint Exercises Ankle Circles/Pumps:  AROM;10 reps;Supine;Both Quad Sets: AROM;Both;10 reps;Supine Heel Slides: AAROM;10 reps;Right;Supine Hip ABduction/ADduction: AAROM;10 reps;Right;Supine   PT Diagnosis: Difficulty walking  PT Problem List: Decreased strength;Decreased range of motion;Decreased activity tolerance;Decreased mobility;Decreased balance;Decreased knowledge of use of DME;Decreased safety awareness;Pain;Decreased knowledge of precautions PT Treatment Interventions: DME instruction;Gait training;Stair training;Functional mobility training;Therapeutic activities;Therapeutic exercise;Patient/family education   PT Goals Acute Rehab PT Goals PT Goal Formulation: With patient Time For Goal Achievement: 12/24/11 Potential to Achieve Goals: Fair Pt will go Supine/Side to Sit: with supervision PT Goal: Supine/Side to Sit - Progress: Goal set today Pt will go Sit to Supine/Side: with supervision PT Goal: Sit to Supine/Side - Progress: Goal set today Pt will go Sit to Stand: with supervision PT Goal: Sit to Stand - Progress: Goal set today Pt will go Stand to Sit: with supervision PT Goal: Stand to Sit - Progress: Goal set today Pt will Ambulate: 51 - 150 feet;with supervision;with rolling walker PT Goal: Ambulate - Progress: Goal set today Pt will Go Up / Down Stairs: 3-5 stairs;with min assist;with least restrictive assistive device PT Goal: Up/Down Stairs - Progress: Goal set today  Visit Information  Last PT Received On: 12/19/11 Assistance Needed: +2    Subjective Data  Subjective: I've dislocated it three times Patient Stated Goal: Home   Prior Functioning  Home Living Lives With: Family Available Help at Discharge: Family Type of Home: House Home Access: Stairs to enter Secretary/administrator of Steps: 5 Entrance Stairs-Rails: Right;Left Home Layout: One level Home Adaptive Equipment: Walker - rolling;Walker - four wheeled Prior Function Level of Independence: Needs assistance Needs Assistance:  Gait Able to Take Stairs?: Yes Driving: No Vocation: Retired Musician: No difficulties    Cognition  Overall  Cognitive Status: Appears within functional limits for tasks assessed/performed Arousal/Alertness: Awake/alert Orientation Level: Appears intact for tasks assessed Behavior During Session: [facility] for tasks performed    Extremity/Trunk Assessment Right Upper Extremity Assessment RUE ROM/Strength/Tone: [facility] for tasks assessed Left Upper Extremity Assessment LUE ROM/Strength/Tone: Deficits LUE ROM/Strength/Tone Deficits: min grip strength with fingers in full ext; elbow/shoulder strength `2/5 Right Lower Extremity Assessment RLE ROM/Strength/Tone: Deficits RLE ROM/Strength/Tone Deficits: AAROM WFL following THP; hip strength 2+/5 Left Lower Extremity Assessment LLE ROM/Strength/Tone: WFL for tasks assessed   Balance    End of Session PT - End of Session Equipment Utilized During Treatment: Gait belt Activity Tolerance: Patient limited by pain;Patient limited by fatigue Patient left: in chair;with call bell/phone within reach;with family/visitor present Nurse Communication: Mobility status   [name] 12/19/2011, 2:57 PM

## 2011-12-19 NOTE — Plan of Care (Signed)
Problem: Phase I Progression Outcomes Goal: [place] evening of surgery Outcome: Not Met (add Reason) Patient wants to wait until a later time r/t pain issues

## 2011-12-19 NOTE — Op Note (Signed)
NAMEKORAH, HUFSTEDLER NO.:  [id number]  MEDICAL RECORD NO.:  [id number]  LOCATION:  1617                         FACILITY:  [facility]  PHYSICIAN:  Ollen Gross, M.D.    DATE OF BIRTH:  December 30, 1925  DATE OF PROCEDURE:  12/18/2011 DATE OF DISCHARGE:                              OPERATIVE REPORT   PREOPERATIVE DIAGNOSIS:  Recurrent instability, right total hip arthroplasty.  POSTOPERATIVE DIAGNOSIS:  Recurrent instability, right total hip arthroplasty.  PROCEDURE:  Right hip revision to a constrained liner of the acetabulum.  SURGEON:  Ollen Gross, M.D.  ASSISTANT:  Alexzandrew L. Perkins, P.A.C.  ANESTHESIA:  Spinal per Anesthesia.  ESTIMATED BLOOD LOSS:  100.  DRAIN:  Hemovac x1.  COMPLICATIONS:  None.  CONDITION:  Stable to Recovery.  CLINICAL NOTE:  Ms. Paull is an 78 year old female who had a total hip arthroplasty on the right done approximately 12 years ago.  Did very well until the past several months where she has had 2-3 dislocations. X-ray does not show any abnormality in the prosthetic positioning.  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  She presents now for revision to a constrained liner as she is very sedentary and constrained liner would provide her the best stability without having to change the other prosthetic parts.  PROCEDURE IN DETAIL:  After successful administration of spinal anesthetic, patient was placed in the left lateral decubitus position with the right side up and held with the hip positioner.  Right lower extremity was then isolated from perineum with plastic drape and prepped and draped in a usual sterile fashion.  Short posterolateral incision was made with a 10 blade through subcutaneous tissue to the fascia lata, which was incised in line with the skin incision.  Sciatic nerve was palpated, protected, and the posterior pseudocapsule excised off the femur.  There was  a large amount of polyethylene wear, debris, and osteolytic debris, which was removed.  I then dislocated the hip.  She dislocated at 90 degrees flexion, 40 degrees adduction, and about 40 degrees internal rotation.  The stability was still fairly decent.  I removed the femoral head with a bone tamp.  The trunnion looks fine on the femoral neck.  The femur was retracted anteriorly to gain acetabular exposure.  Soft tissue and a small bony rim was removed from the edge of the acetabulum.  I then used the extraction device, the Duraloc cup to remove the polyethylene liner.  The internal locking wire was removed from the cup.  There is a 50-mm acetabular shell.  It was in excellent position and was well-fixed.  We then placed a new locking wire and then placed the constrained polyethylene, which was a 28-mm neutral +4 for 50- mm shell.  It was impacted into the acetabular shell and found to be well-seated.  I had to remove the 28+ 6 head and thus placed a new 28+ 6 femoral head onto the femoral neck.  The position of the femoral component was also excellent.  We then reduced the hip and then snapped the locking ring into place.  She has had a very stable range of  motion with flexion at 90, about 20 internal rotation, 20 adduction and about 70 flexion, 40 adduction, and 70 internal rotation.  No signs of any stress or impingement on the liner.  Wound was then copiously irrigated with saline solution and posterior pseudocapsule reattached to the femur through drill holes with Ethibond suture.  All the osteolytic debris had been removed.  Exparel 20 mL mixed with 50 mL saline injected into the pseudocapsule, gluteal muscles, fascia lata, and subcu tissues.  Fascia was then closed over Hemovac drain with #1 V-Loc suture.  Subcu closed with interrupted 2-0 Vicryl and subcuticular running 4-0 Monocryl. Drains hooked to suction.  Incision cleaned and dried.  Steri-Strips and a bulky sterile dressing  applied.  She was then awakened and transported to Recovery in stable condition.     Ollen Gross, M.D.     FA/MEDQ  D:  12/18/2011  T:  12/19/2011  Job:  [id number]

## 2011-12-20 LAB — BASIC METABOLIC PANEL
BUN: 14 mg/dL (ref 6–23)
Chloride: 105 mEq/L (ref 96–112)
GFR calc Af Amer: >90 mL/min (ref >90)
Glucose, Bld: 88 mg/dL (ref 70–99)
Potassium: 4.1 mEq/L (ref 3.5–5.1)

## 2011-12-20 LAB — TYPE AND SCREEN
Unit division: 0
Unit division: 0

## 2011-12-20 LAB — CBC
HCT: 30.4 % — ABNORMAL LOW (ref 36.0–46.0)
Hemoglobin: 10.5 g/dL — ABNORMAL LOW (ref 12.0–15.0)
RDW: 19.1 % — ABNORMAL HIGH (ref 11.5–15.5)
WBC: 4.7 10*3/uL (ref 4.0–10.5)

## 2011-12-20 NOTE — Progress Notes (Signed)
Physical Therapy Treatment Patient Details Name: [name] MRN: [id number] DOB: [birth year] Today's Date: 12/20/2011 Time: 4098-1191 PT Time Calculation (min): 29 min  PT Assessment / Plan / Recommendation Comments on Treatment Session  Increased activity tolerance vs last day but with continued significant assist required for mobility    Follow Up Recommendations  Home health PT;Skilled nursing facility    Barriers to Discharge        Equipment Recommendations  None recommended by OT;None recommended by PT    Recommendations for Other Services OT consult  Frequency 7X/week   Plan Discharge plan remains appropriate    Precautions / Restrictions Precautions Precautions: Posterior Hip Precaution Comments: pt recalls 1/3 THR without cues Restrictions Weight Bearing Restrictions: No RLE Weight Bearing: Weight bearing as tolerated   Pertinent Vitals/Pain     Mobility  Bed Mobility Bed Mobility: Sit to Supine Supine to Sit: 3: Mod assist Details for Bed Mobility Assistance: performed by PT Transfers Transfers: Sit to Stand;Stand to Sit Sit to Stand: 1: +2 Total assist Sit to Stand: Patient Percentage: 50% Stand to Sit: 1: +2 Total assist Stand to Sit: Patient Percentage: 50% Details for Transfer Assistance: cues for hand placement; R hip internally rotates Ambulation/Gait Ambulation/Gait Assistance: 1: +2 Total assist Ambulation/Gait: Patient Percentage: 70% Ambulation Distance (Feet): 32 Feet Assistive device: Rolling walker Ambulation/Gait Assistance Details: cues for posture, stride length, position from RW and sequence Gait Pattern: Step-to pattern;Decreased step length - right;Decreased step length - left;Decreased stance time - right    Exercises Total Joint Exercises Ankle Circles/Pumps: AAROM;15 reps;Supine;Both Quad Sets: AAROM;15 reps;Supine;Both Heel Slides: AAROM;15 reps;Supine;Right Hip ABduction/ADduction: AAROM;15 reps;Right;Supine   PT Diagnosis:     PT Problem List:   PT Treatment Interventions:     PT Goals Acute Rehab PT Goals PT Goal Formulation: With patient Time For Goal Achievement: 12/24/11 Potential to Achieve Goals: Fair Pt will go Supine/Side to Sit: with supervision PT Goal: Supine/Side to Sit - Progress: Progressing toward goal Pt will go Sit to Supine/Side: with supervision PT Goal: Sit to Supine/Side - Progress: Progressing toward goal Pt will go Sit to Stand: with supervision PT Goal: Sit to Stand - Progress: Progressing toward goal Pt will go Stand to Sit: with supervision PT Goal: Stand to Sit - Progress: Progressing toward goal Pt will Ambulate: 51 - 150 feet;with supervision;with rolling walker PT Goal: Ambulate - Progress: Progressing toward goal  Visit Information  Last PT Received On: 12/20/11 Assistance Needed: +2    Subjective Data  Subjective: I'm doing better than yesterday but I don't think I'll be ready to go home tomorrow Patient Stated Goal: Home   Cognition  Overall Cognitive Status: Appears within functional limits for tasks assessed/performed (needs reinforcement with thps) Arousal/Alertness: Awake/alert Orientation Level: Appears intact for tasks assessed Behavior During Session: [facility] for tasks performed    Balance     End of Session PT - End of Session Equipment Utilized During Treatment: Gait belt Activity Tolerance: Patient tolerated treatment well Patient left: in chair;with call bell/phone within reach;with family/visitor present Nurse Communication: Mobility status    [name] 12/20/2011, 1:19 PM

## 2011-12-20 NOTE — Care Management Note (Signed)
    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 Pt wants to go home but will consider SNf if necessary. Already has RW, coomode seat. HH choice offered. [facility] choice, [facility].  [facility] #3Son -caregiver.List of HH agencies placed on chart   Anticipated DC Date:  12/22/2011   Anticipated DC Plan:  HOME W HOME HEALTH SERVICES  In-house referral  Clinical Social Worker      DC Planning Services  CM consult      [facility] Choice  HOME HEALTH   Choice offered to / List presented to:  C-1 Patient   DME arranged  NA      DME agency  NA     HH arranged  HH-2 PT      [facility] agency  [facility]   Status of service:  Completed, signed off Medicare Important Message given?  NO (If response is "NO", the following Medicare IM given date fields will be blank) Date Medicare IM given:   Date Additional Medicare IM given:    Discharge Disposition:  HOME W HOME HEALTH SERVICES  Per UR Regulation:  Reviewed for med. necessity/level of care/duration of stay  If discussed at Long Length of Stay Meetings, dates discussed:    Comments:  12/23/2011 Raynelle Bring BSN CCM [phone] Pt for discharge today. [facility] notified and will start services tomorrow   12/20/2011 Raynelle Bring BSN CCM [phone] Advised by [facility] that [facility] does not provide services in  pt's zipcode area after re-examining by supervisor. CM spoke with [facility] who does provide services in area-can provide HHpt; there are no HHrn available til end of next week. [facility] was  faxed HH orders. Pt was advised of change in [facility] agency. She is okay with change.    12/20/2011 Raynelle Bring BSn CCM [phone] [facility] states cannot provide [facility] services [facility] cannot not provide /not in service area. [facility]-spoke with intake Clydie Braun who states they can provide HHpt; there will be no HHrn available to end of next week from [place] office. Faxed hhpt orders, op note, h&P, face sheet to [phone], confirmation received.

## 2011-12-20 NOTE — Progress Notes (Signed)
12/20/2011 Raynelle Bring BSN CCM [phone] Advised by [facility] that [facility] does not provide services in  pt's zipcode area after re-examining by supervisor. CM spoke with [facility] who does provide services in area-can provide HHpt; there are no HHrn available til end of next week. [facility] was faxed HH orders. Pt was advised of change in [facility] agency. She is okay with change.

## 2011-12-20 NOTE — Progress Notes (Signed)
Occupational Therapy Evaluation Patient Details Name: [name] MRN: [id number] DOB: [birth year] Today's Date: 12/20/2011 Time: 4132-4401 OT Time Calculation (min): 20 min  OT Assessment / Plan / Recommendation Clinical Impression  This 78 year old female was admitted for R THR with constraint liner.  She had assist for ADLs at baseline but got up and used 3:1 commode by herself, pt son. She is appropriate for skilled OT to increase independence with toileting and to continue to reinforce education for thps.      OT Assessment  Patient needs continued OT Services    Follow Up Recommendations  Supervision/Assistance - 24 hour;Home health OT (vs. STSNF, depending upon progress)    Barriers to Discharge      Equipment Recommendations  None recommended by OT;None recommended by PT    Recommendations for Other Services    Frequency  Min 2X/week    Precautions / Restrictions Precautions Precautions: Posterior Hip Restrictions Weight Bearing Restrictions: No RLE Weight Bearing: Weight bearing as tolerated   Pertinent Vitals/Pain RLE sore; repositioned    ADL  Eating/Feeding: Simulated;Minimal assistance (full set up for tray.  Reports spoon OK, fork not) Where Assessed - Eating/Feeding: Chair Transfers/Ambulation Related to ADLs: cotx with PT.  Ambulated in hall ADL Comments: Pt has assist for ADLs:  used to get to 3:1 alone.  Son present.  Recommended pt have reacher as she has reached over and dislocated hip twice.  Pt now has constraint liner    OT Diagnosis: Generalized weakness  OT Problem List: Decreased strength;Decreased range of motion;Decreased activity tolerance;Decreased knowledge of precautions OT Treatment Interventions: Self-care/ADL training;Patient/family education;DME and/or AE instruction   OT Goals Acute Rehab OT Goals OT Goal Formulation: With patient/family Time For Goal Achievement: 12/27/11 Potential to Achieve Goals: Good ADL Goals Pt Will Perform  Eating: with set-up;with adaptive utensils (pt will report ability to use fork with built up foam ) ADL Goal: Eating - Progress: Goal set today Pt Will Transfer to Toilet: with mod assist;Stand pivot transfer;3-in-1;Maintaining hip precautions;with cueing (comment type and amount) (min cues) ADL Goal: Toilet Transfer - Progress: Goal set today Pt Will Perform Toileting - Hygiene: with mod assist;Standing at 3-in-1/toilet;with cueing (comment type and amount) (min cues) ADL Goal: Toileting - Hygiene - Progress: Goal set today  Visit Information  Last OT Received On: 12/20/11 Assistance Needed: +2    Subjective Data  Subjective: "I'm just going to leave it if something falls"   Prior Functioning  Home Living Bathroom Shower/Tub: Tub/shower unit (seat inside of tub) Bathroom Toilet: Standard (3:1) Prior Function Level of Independence: Needs assistance Communication Communication: No difficulties Dominant Hand: Right    Cognition  Overall Cognitive Status: Appears within functional limits for tasks assessed/performed (needs reinforcement with thps) Behavior During Session: [facility] for tasks performed    Extremity/Trunk Assessment Right Upper Extremity Assessment RUE ROM/Strength/Tone: Deficits RUE ROM/Strength/Tone Deficits: strength overall 3+/5; can make loose fist. Pt does hyperextend at finger digits but can flex them  Left Upper Extremity Assessment LUE ROM/Strength/Tone: Deficits LUE ROM/Strength/Tone Deficits: strength 3+/5.  Pt hyperextends at PIPs unable to flex at all   Mobility Bed Mobility Details for Bed Mobility Assistance: performed by PT Transfers Sit to Stand: 1: +2 Total assist Sit to Stand: Patient Percentage: 50% Details for Transfer Assistance: cues for hand placement; R hip internally rotates   Exercise    Balance    End of Session OT - End of Session Activity Tolerance: Patient tolerated treatment well Patient left: in  chair;with call bell/phone within reach    [facility] 12/20/2011, 12:39 PM Marica Otter, OTR/L [phone] 12/20/2011

## 2011-12-20 NOTE — Progress Notes (Signed)
Subjective: 2 Days Post-Op Procedure(s) (LRB): TOTAL HIP REVISION (Right) Patient reports pain as mild and moderate.   Patient seen in rounds with Dr. Lequita Halt. Patient is well, but has had some minor complaints of spasms Plan is to go Home after hospital stay but if she does not progress, she may need to consider a short stay in SNF for rehab. Possibly home over the weekend if she improves and progresses with therapy.    Objective: Vital signs in last 24 hours: Temp:  [97.4 F (36.3 C)-98.2 F (36.8 C)] 98.2 F (36.8 C) (06/14 0635) Pulse Rate:  [53-61] 60  (06/14 0635) Resp:  [16-18] 16  (06/14 0800) BP: (96-126)/(57-74) 125/69 mmHg (06/14 0635) SpO2:  [92 %-100 %] 100 % (06/14 0800)  Intake/Output from previous day:  Intake/Output Summary (Last 24 hours) at 12/20/11 0833 Last data filed at 12/20/11 0640  Gross per 24 hour  Intake 2286.5 ml  Output   3125 ml  Net -838.5 ml    Intake/Output this shift:    Labs:  Basename 12/20/11 0415 12/19/11 0501  HGB 10.5* 8.2*    Basename 12/20/11 0415 12/19/11 0501  WBC 4.7 3.6*  RBC 3.17* 2.26*  HCT 30.4* 23.4*  PLT 135* 141*    Basename 12/20/11 0415 12/19/11 0501  NA 136 135  K 4.1 4.0  CL 105 103  CO2 24 26  BUN 14 16  CREATININE 0.65 0.61  GLUCOSE 88 98  CALCIUM 8.7 8.6   No results found for this basename: LABPT:2,INR:2 in the last 72 hours  EXAM General - Patient is Alert, Appropriate and Oriented Extremity - Neurovascular intact Sensation intact distally No cellulitis present Dressing/Incision - clean, dry, no drainage, healing Motor Function - intact, moving foot and toes well on exam.   Past Medical History  Diagnosis Date  . Hypothyroidism   . Atrial fibrillation   . Myositis     BODY  . LVH (left ventricular hypertrophy)     MILD  . Dizziness   . Headache   . Weakness   . Chest pain   . DOE (dyspnea on exertion)   . Poor appetite   . UTI (urinary tract infection)   . Dysphagia    History of dysphagia 2, depressed by esophagus status post dilation  . Arthritis   . Hypokalemia   . History of anxiety   . Orthostatic hypotension   . Inclusion body myositis   . Easy bruising   . Complication of anesthesia     anxiety before anesthesia, wants spinal    Assessment/Plan: 2 Days Post-Op Procedure(s) (LRB): TOTAL HIP REVISION (Right) Active Problems:  Postop Acute blood loss anemia  Postop Transfusion   Advance diet Up with therapy Discharge home with home health Discharge to SNF if she does not progress well.  DVT Prophylaxis - Xarelto Weight Bearing as tolerated right Leg  [name] 12/20/2011, 8:33 AM

## 2011-12-20 NOTE — Progress Notes (Signed)
Physical Therapy Treatment Patient Details Name: [name] MRN: [id number] DOB: 1926/03/14 Today's Date: 12/20/2011 Time: 4098-1191 PT Time Calculation (min): 25 min  PT Assessment / Plan / Recommendation Comments on Treatment Session  pt and family concerned regarding possible d/c to home Sat - pt will not be safe for d/c at that time based on buckling during ambulation this date.    Follow Up Recommendations  Home health PT;Skilled nursing facility    Barriers to Discharge        Equipment Recommendations  None recommended by OT;None recommended by PT    Recommendations for Other Services OT consult  Frequency 7X/week   Plan Discharge plan remains appropriate    Precautions / Restrictions Precautions Precautions: Posterior Hip Precaution Comments: pt recalls 1/3 THR without cues Restrictions Weight Bearing Restrictions: No RLE Weight Bearing: Weight bearing as tolerated   Pertinent Vitals/Pain     Mobility  Bed Mobility Bed Mobility: Supine to Sit Sit to Supine: 3: Mod assist Details for Bed Mobility Assistance: cues for sequence, THP and use of UEs and L LE to self assist Transfers Transfers: Sit to Stand;Stand to Sit Sit to Stand: 1: +2 Total assist Sit to Stand: Patient Percentage: 60% Stand to Sit: 1: +2 Total assist Stand to Sit: Patient Percentage: 60% Details for Transfer Assistance: cues for use of UEs and for LE management Ambulation/Gait Ambulation/Gait Assistance: 1: +2 Total assist Ambulation/Gait: Patient Percentage: 60% Ambulation Distance (Feet): 25 Feet (25' and 18') Assistive device: Rolling walker Ambulation/Gait Assistance Details: Cues for posture, sequence, ER on L LE and position from RW.  2 episodes legs buckling and pt requiring mod assist to regain balance Gait Pattern: Step-to pattern;Decreased step length - right;Decreased step length - left;Decreased stance time - right    Exercises     PT Diagnosis:    PT Problem List:   PT  Treatment Interventions:     PT Goals Acute Rehab PT Goals PT Goal Formulation: With patient Time For Goal Achievement: 12/24/11 Potential to Achieve Goals: Fair Pt will go Supine/Side to Sit: with supervision PT Goal: Supine/Side to Sit - Progress: Progressing toward goal Pt will go Sit to Supine/Side: with supervision PT Goal: Sit to Supine/Side - Progress: Progressing toward goal Pt will go Sit to Stand: with supervision PT Goal: Sit to Stand - Progress: Progressing toward goal Pt will go Stand to Sit: with supervision PT Goal: Stand to Sit - Progress: Progressing toward goal Pt will Ambulate: 51 - 150 feet;with supervision;with rolling walker PT Goal: Ambulate - Progress: Progressing toward goal  Visit Information  Last PT Received On: 12/20/11 Assistance Needed: +2    Subjective Data  Subjective: Don't let me fall Patient Stated Goal: Home   Cognition  Overall Cognitive Status: Appears within functional limits for tasks assessed/performed Arousal/Alertness: Awake/alert Orientation Level: Appears intact for tasks assessed Behavior During Session: [facility] for tasks performed    Balance     End of Session PT - End of Session Equipment Utilized During Treatment: Gait belt Activity Tolerance: Patient limited by fatigue Patient left: in bed;with call bell/phone within reach;with family/visitor present Nurse Communication: Mobility status    [name] 12/20/2011, 4:30 PM

## 2011-12-20 NOTE — Progress Notes (Signed)
Clinical Social Work Department BRIEF PSYCHOSOCIAL ASSESSMENT 12/20/2011  Patient:  [name], [name]     Account Number:  [id number]     Admit date:  12/18/2011  Clinical Social Worker:  Candie Chroman  Date/Time:  12/20/2011 07:53 AM  Referred by:  RN  Date Referred:  12/19/2011 Referred for  SNF Placement   Other Referral:   Interview type:  Patient Other interview type:    PSYCHOSOCIAL DATA Living Status:  FAMILY Admitted from facility:   Level of care:   Primary support name:  [name] Primary support relationship to patient:  CHILD, ADULT Degree of support available:   supportive    CURRENT CONCERNS Current Concerns  Post-Acute Placement   Other Concerns:    SOCIAL WORK ASSESSMENT / PLAN Pt is an 78 yr old female living at home prior to hospitalization. Met with pt/son to assist with d/c planning. Pt states she plans to return home with family support upon d/c . Son has confirmed d/c plan. Pt declines ST SNF placement stating she has been in placement in the past and does not want to go again. Pt/family aware that CSW is available to assist with SNF placement if pt changes her mind.   Assessment/plan status:  No Further Intervention Required Other assessment/ plan:   Information/referral to community resources:   SNF list and CSW cell # provided in case further assistance is needed.    PATIENT'S/FAMILY'S RESPONSE TO PLAN OF CARE: Pt plans to return home with [name] and family support.    Marissa Razor LCSW [phone]

## 2011-12-21 LAB — CBC
HCT: 29.5 % — ABNORMAL LOW (ref 36.0–46.0)
Hemoglobin: 10 g/dL — ABNORMAL LOW (ref 12.0–15.0)
MCHC: 33.9 g/dL (ref 30.0–36.0)
RBC: 3.04 MIL/uL — ABNORMAL LOW (ref 3.87–5.11)

## 2011-12-21 NOTE — Progress Notes (Signed)
Occupational Therapy Treatment Patient Details Name: [name] MRN: [id number] DOB: 11-Jun-1926 Today's Date: 12/21/2011 Time: 4098-1191 OT Time Calculation (min): 21 min  OT Assessment / Plan / Recommendation Comments on Treatment Session Pt tolerated session well. Pt still unsure whether she will discharge home versus SNF. Son concerned about pt coming home "too soon." May benefit from ST SNF first.     Follow Up Recommendations  Home health OT versus Skilled nursing facility depending on progress. ;Will need Supervision/Assistance - 24 hour if home   Barriers to Discharge       Equipment Recommendations  None recommended by PT;None recommended by OT    Recommendations for Other Services    Frequency Min 2X/week   Plan Discharge plan remains appropriate    Precautions / Restrictions Precautions Precautions: Posterior Hip Restrictions Weight Bearing Restrictions: No RLE Weight Bearing: Weight bearing as tolerated        ADL  Toilet Transfer: Performed;+2 Total assistance Toilet Transfer: Patient Percentage: 70%, no episodes of buckling. Verbal cues for THPs. Toilet Transfer Method: Stand pivot;Other (comment) (chair to [facility] then to bed. Nsg tech helped pt's LEs back onto bed.) Toilet Transfer Equipment: Bedside commode Toileting - Clothing Manipulation and Hygiene: Performed;+2 Total assistance Toileting - Clothing Manipulation and Hygiene: Patient Percentage: 50% Where Assessed - Toileting Clothing Manipulation and Hygiene: Standing ADL Comments: Reviewed THPs. Pt able to state 2/3 independently. Son present for part of session and states pt would like to go home, not a SNF but he doesnt want her to come home "too soon." Pt with no episodes of knees trying to buckle with toilet transfer.     OT Diagnosis:    OT Problem List:   OT Treatment Interventions:     OT Goals ADL Goals ADL Goal: Toilet Transfer - Progress: Progressing toward goals ADL Goal: Toileting -  Hygiene - Progress: Progressing toward goals  Visit Information  Last OT Received On: 12/21/11 Assistance Needed: +2    Subjective Data  Subjective: I could try to go to the bathroom. Patient Stated Goal: agreeable to work with OT. None stated   Prior Functioning       Cognition  Overall Cognitive Status: Appears within functional limits for tasks assessed/performed Arousal/Alertness: Awake/alert Orientation Level: Appears intact for tasks assessed Behavior During Session: [facility] for tasks performed    Mobility  Transfers Transfers: Sit to Stand;Stand to Sit Sit to Stand: 1: +2 Total assist;From chair/3-in-1 Sit to Stand: Patient Percentage: 60% Stand to Sit: 1: +2 Total assist;To chair/3-in-1 Stand to Sit: Patient Percentage: 60% Details for Transfer Assistance: verbal cues for hand placement and R LE forward   Exercises    Balance    End of Session OT - End of Session Equipment Utilized During Treatment: Gait belt Activity Tolerance: Patient tolerated treatment well Patient left: in bed;with call bell/phone within reach;with family/visitor present   Lennox Laity [phone] 12/21/2011, 2:38 PM

## 2011-12-21 NOTE — Progress Notes (Signed)
Physical Therapy Treatment Patient Details Name: [name] MRN: [id number] DOB: [birth year] Today's Date: 12/21/2011 Time: 4098-1191 PT Time Calculation (min): 20 min  PT Assessment / Plan / Recommendation Comments on Treatment Session  Pt progressing slowly with limited amb distance and activity tolerance.  Pt really wants to D/C to home however functionally will need 24/7 assist.  HIGH FALL RISK.  May need SNF for ST Rehab.    Follow Up Recommendations  Skilled nursing facility;Supervision/Assistance - 24 hour;Home health PT    Barriers to Discharge        Equipment Recommendations       Recommendations for Other Services    Frequency     Plan Discharge plan remains appropriate    Precautions / Restrictions     Pertinent Vitals/Pain C/o MAX weakness    Mobility  Bed Mobility Bed Mobility: Supine to Sit Supine to Sit: 2: Max assist Details for Bed Mobility Assistance: cues for sequence, THP and use of UEs and L LE to self assist  Transfers Transfers: Sit to Stand;Stand to Sit Sit to Stand: 1: +2 Total assist;From bed Sit to Stand: Patient Percentage: 50% Stand to Sit: 1: +2 Total assist;To chair/3-in-1 Stand to Sit: Patient Percentage: 50% Details for Transfer Assistance: pt demon increased weakness Ambulation/Gait Ambulation/Gait Assistance: 1: +2 Total assist Ambulation/Gait: Patient Percentage: 50% Ambulation Distance (Feet): 12 Feet (7', 5') Assistive device: Rolling walker Ambulation/Gait Assistance Details: Chair following closely behind, MAX c/o fatigue and B knees buckling Gait Pattern: Step-to pattern;Decreased stance time - right;Trunk flexed Stairs: No Wheelchair Mobility Wheelchair Mobility: No     PT Goals                       Progressing slowly      Visit Information  Last PT Received On: 12/21/11 Assistance Needed: +2                   End of Session PT - End of Session Equipment Utilized During Treatment: Gait belt Activity  Tolerance: Patient limited by fatigue Patient left: in chair;with call bell/phone within reach Nurse Communication: Mobility status   Felecia Shelling  PTA [facility]  Acute  Rehab Pager     [phone]

## 2011-12-21 NOTE — Progress Notes (Signed)
Subjective: Feeling good  Objective: Vital signs in last 24 hours: Temp:  [97.9 F (36.6 C)-98.2 F (36.8 C)] 97.9 F (36.6 C) (06/15 0443) Pulse Rate:  [62-64] 64  (06/15 0443) Resp:  [14-16] 16  (06/15 0443) BP: (90-125)/(40-66) 120/59 mmHg (06/15 0443) SpO2:  [100 %] 100 % (06/15 0443)  Intake/Output from previous day: 06/14 0701 - 06/15 0700 In: 1201.3 [P.O.:720; I.V.:481.3] Out: 1275 [Urine:1275] Intake/Output this shift: Total I/O In: 240 [P.O.:240] Out: 600 [Urine:600]   Basename 12/21/11 0525 12/20/11 0415 12/19/11 0501  HGB 10.0* 10.5* 8.2*    Basename 12/21/11 0525 12/20/11 0415  WBC 4.3 4.7  RBC 3.04* 3.17*  HCT 29.5* 30.4*  PLT 143* 135*    Basename 12/20/11 0415 12/19/11 0501  NA 136 135  K 4.1 4.0  CL 105 103  CO2 24 26  BUN 14 16  CREATININE 0.65 0.61  GLUCOSE 88 98  CALCIUM 8.7 8.6   No results found for this basename: LABPT:2,INR:2 in the last 72 hours  Thin frail cons alert approp, no distress, right hip wound clean and dry, on sign of infection, calf soft NT, leg NMVI  Assessment/Plan: POD#3 s/p acetab revision stable slow progress with PT HGB stable  Plan D/C IV fluid, continue with PT   Jamelle Rushing 12/21/2011, 9:22 AM

## 2011-12-22 NOTE — Progress Notes (Addendum)
Occupational Therapy Treatment Patient Details Name: [name] MRN: [id number] DOB: 1925-08-09 Today's Date: 12/22/2011 Time: 0865-7846 OT Time Calculation (min): 20 min  OT Assessment / Plan / Recommendation Comments on Treatment Session  Pt continues to improve    Follow Up Recommendations   Home health OT; home with 24/7 close supervision    Barriers to Discharge       Equipment Recommendations  None recommended by OT;None recommended by PT;Defer to next venue    Recommendations for Other Services    Frequency  2x   Plan      Precautions / Restrictions Precautions Precautions: Posterior Hip Precaution Comments: recalled 2 Restrictions Weight Bearing Restrictions: No RLE Weight Bearing: Weight bearing as tolerated   Pertinent Vitals/Pain Pain 2-3 in chair; was 6-7 standing;  Asked RN for pain meds    ADL  Grooming: Performed;Teeth care;Minimal assistance Where Assessed - Grooming: Supported standing Toilet Transfer: Performed;Minimal Dentist Method:  (ambulated to bathroom) Toilet Transfer Equipment: Raised toilet seat with arms (or 3-in-1 over toilet) (began with +2:  +1 to walk out) Therapist, nutritional and Hygiene: Performed;Minimal assistance Where Assessed - Engineer, mining and Hygiene: Standing Transfers/Ambulation Related to ADLs: mod cues for thps during transfers and sit to/from stand ADL Comments: pt much improved but needs constant supervision and mod cueing    OT Diagnosis:    OT Problem List:   OT Treatment Interventions:     OT Goals ADL Goals Pt Will Perform Eating: with set-up;with adaptive utensils ADL Goal: Eating - Progress: Discontinued (comment) (sent foam home:  states it helps) Pt Will Transfer to Toilet: with mod assist;Stand pivot transfer;3-in-1;Maintaining hip precautions;with cueing (comment type and amount) ADL Goal: Toilet Transfer - Progress: Met Pt Will Perform Toileting -  Hygiene: with mod assist;Standing at 3-in-1/toilet;with cueing (comment type and amount) ADL Goal: Toileting - Hygiene - Progress: Met Miscellaneous OT Goals Miscellaneous OT Goal #1: Revised #1  Pt will ambulated to 3:1 and perform all aspects of toileting with min guard A OT Goal: Miscellaneous Goal #1 - Progress: Goal set today Miscellaneous OT Goal #2: Pt will perform grooming at sink at min guard level OT Goal: Miscellaneous Goal #2 - Progress: Goal set today Miscellaneous OT Goal #3: Pt will state 3/3 thps OT Goal: Miscellaneous Goal #3 - Progress: Goal set today  Visit Information  Last OT Received On: 12/22/11 Assistance Needed: +1 PT/OT Co-Evaluation/Treatment: Yes    Subjective Data      Prior Functioning       Cognition  Overall Cognitive Status: Impaired (memory) Arousal/Alertness: Awake/alert Orientation Level: Appears intact for tasks assessed Behavior During Session: [facility] for tasks performed    Mobility Transfers Sit to Stand: 4: Min assist;From elevated surface;From chair/3-in-1;With upper extremity assist Sit to Stand: Patient Percentage: 80% Stand to Sit: 4: Min assist;With upper extremity assist;With armrests;To chair/3-in-1 Stand to Sit: Patient Percentage: 90% Details for Transfer Assistance: vcs extend RLE and hands   Exercises   Balance    End of Session OT - End of Session Activity Tolerance: Patient tolerated treatment well Patient left: in chair;with call bell/phone within reach   [facility] 12/22/2011, 9:32 AM Marica Otter, OTR/L [phone] 12/22/2011

## 2011-12-22 NOTE — Progress Notes (Signed)
Afebrile and comfortable. Hgb 10.5.  Probably to SNF.

## 2011-12-22 NOTE — Progress Notes (Signed)
Physical Therapy Treatment Patient Details Name: [name] MRN: [id number] DOB: 06/08/1926 Today's Date: 12/22/2011 Time: 7829-5621 PT Time Calculation (min): 21 min  PT Assessment / Plan / Recommendation Comments on Treatment Session  Good progress today, increased gait distance, decreased assist required.  May be able to DC home with close 24* supervision. REquires cuing to follow hip precautions.     Follow Up Recommendations  Skilled nursing facility;Supervision/Assistance - 24 hour;Home health PT    Barriers to Discharge        Equipment Recommendations  Defer to next venue    Recommendations for Other Services    Frequency     Plan Discharge plan remains appropriate    Precautions / Restrictions Precautions Precautions: Posterior Hip Precaution Comments: pt able to state 2 of 3 precautions, reviewed hip precautions with pt Restrictions RLE Weight Bearing: Weight bearing as tolerated   Pertinent Vitals/Pain *6/10 R hip after walking, ice applied, pain meds requested**    Mobility  Transfers Transfers: Sit to Stand;Stand to Sit Sit to Stand: 4: Min assist;From elevated surface;From chair/3-in-1;With upper extremity assist Sit to Stand: Patient Percentage: 80% Stand to Sit: 4: Min assist;With upper extremity assist;With armrests;To chair/3-in-1 Stand to Sit: Patient Percentage: 90% Details for Transfer Assistance: VCs hand placement Ambulation/Gait Ambulation/Gait Assistance: 4: Min assist Ambulation/Gait: Patient Percentage: 90% Ambulation Distance (Feet): 25 Feet (12' + 13') Assistive device: Rolling walker Ambulation/Gait Assistance Details: VCs for sequencing, and to step into RW Gait Pattern: Trunk flexed;Decreased step length - right;Decreased step length - left Stairs: No    Exercises Total Joint Exercises Ankle Circles/Pumps: AAROM;15 reps;Supine;Both Quad Sets: AAROM;15 reps;Supine;Both Short Arc Quad: Right;10 reps;Supine;AROM Heel Slides: AAROM;15  reps;Supine;Right Hip ABduction/ADduction: AAROM;15 reps;Right;Supine   PT Diagnosis:    PT Problem List:   PT Treatment Interventions:     PT Goals Acute Rehab PT Goals PT Goal Formulation: With patient Time For Goal Achievement: 12/24/11 Potential to Achieve Goals: Fair Pt will go Supine/Side to Sit: with supervision Pt will go Sit to Supine/Side: with supervision Pt will go Sit to Stand: with supervision PT Goal: Sit to Stand - Progress: Progressing toward goal Pt will go Stand to Sit: with supervision PT Goal: Stand to Sit - Progress: Progressing toward goal Pt will Ambulate: 51 - 150 feet;with supervision;with rolling walker PT Goal: Ambulate - Progress: Progressing toward goal  Visit Information  Last PT Received On: 12/22/11 Assistance Needed: +1    Subjective Data  Subjective: I've been up in the chair for awhile.  Patient Stated Goal: to get stronger   Cognition  Overall Cognitive Status: Appears within functional limits for tasks assessed/performed Arousal/Alertness: Awake/alert Orientation Level: Appears intact for tasks assessed Behavior During Session: [facility] for tasks performed    Balance     End of Session PT - End of Session Equipment Utilized During Treatment: Gait belt Activity Tolerance: Patient limited by fatigue Patient left: in chair;with call bell/phone within reach Nurse Communication: Mobility status    Tamala Ser 12/22/2011, 8:52 AM [phone]

## 2011-12-23 ENCOUNTER — Encounter (HOSPITAL_COMMUNITY): Payer: Self-pay | Admitting: Orthopedic Surgery

## 2011-12-23 MED ORDER — HYDROMORPHONE HCL 2 MG PO TABS: 2.0000 mg | ORAL_TABLET | ORAL | Status: AC | PRN

## 2011-12-23 MED ORDER — RIVAROXABAN 10 MG PO TABS: 10.0000 mg | ORAL_TABLET | Freq: Every day | ORAL | Status: DC

## 2011-12-23 MED ORDER — METHOCARBAMOL 500 MG PO TABS: 500.0000 mg | ORAL_TABLET | Freq: Four times a day (QID) | ORAL | Status: AC | PRN

## 2011-12-23 NOTE — Progress Notes (Signed)
Subjective: 5 Days Post-Op Procedure(s) (LRB): TOTAL HIP REVISION (Right) Patient reports pain as mild.   Patient seen in rounds without Dr. Lequita Halt. Patient is well, and has had no acute complaints or problems. She denies shortness of breath and chest pain. She reports that therapy is going better and she has been able to walk a little farther. She is ready to go home today.  Objective: Vital signs in last 24 hours: Temp:  [97.8 F (36.6 C)-98 F (36.7 C)] 97.8 F (36.6 C) (06/17 0421) Pulse Rate:  [61-62] 61  (06/17 0421) Resp:  [16] 16  (06/17 0421) BP: (96-105)/(60-62) 96/60 mmHg (06/17 0421) SpO2:  [97 %-98 %] 97 % (06/17 0421)  Intake/Output from previous day:  Intake/Output Summary (Last 24 hours) at 12/23/11 0715 Last data filed at 12/23/11 1610  Gross per 24 hour  Intake    480 ml  Output   1900 ml  Net  -1420 ml    Intake/Output this shift: Total I/O In: -  Out: 300 [Urine:300]  Labs:  [facility] 12/21/11 0525  HGB 10.0*    Basename 12/21/11 0525  WBC 4.3  RBC 3.04*  HCT 29.5*  PLT 143*    EXAM General - Patient is Alert and Oriented Extremity - Neurologically intact Neurovascular intact Dorsiflexion/Plantar flexion intact Dressing/Incision - clean, dry, no drainage Motor Function - intact, moving foot and toes well on exam.   Past Medical History  Diagnosis Date  . Hypothyroidism   . Atrial fibrillation   . Myositis     BODY  . LVH (left ventricular hypertrophy)     MILD  . Dizziness   . Headache   . Weakness   . Chest pain   . DOE (dyspnea on exertion)   . Poor appetite   . UTI (urinary tract infection)   . Dysphagia     History of dysphagia 2, depressed by esophagus status post dilation  . Arthritis   . Hypokalemia   . History of anxiety   . Orthostatic hypotension   . Inclusion body myositis   . Easy bruising   . Complication of anesthesia     anxiety before anesthesia, wants spinal    Assessment/Plan: 5 Days Post-Op  Procedure(s) (LRB): TOTAL HIP REVISION (Right) Active Problems:  Postop Acute blood loss anemia  Postop Transfusion   Advance diet Up with therapy Discharge home with home health  DVT Prophylaxis - Xarelto Weight bearing as tolerated Right Leg Home health will follow her at home and her son will be serving as her caregiver  Lael Pilch LAUREN 12/23/2011, 7:15 AM

## 2011-12-23 NOTE — Progress Notes (Signed)
Physical Therapy Treatment Patient Details Name: LERIN JECH MRN: [id number] DOB: [birth year] Today's Date: 12/23/2011 Time: 1478-2956 PT Time Calculation (min): 23 min  PT Assessment / Plan / Recommendation Comments on Treatment Session  Pt verbally recalls 3/3 THP but when I asked he to bend over to "fix your sock", she attempted to do so and required redirection.  Pt plans to D/C to home with 24/7 family assist.    Follow Up Recommendations  Home health PT    Barriers to Discharge        Equipment Recommendations  None recommended by PT    Recommendations for Other Services    Frequency 7X/week   Plan Discharge plan remains appropriate    Precautions / Restrictions Restrictions RLE Weight Bearing: Weight bearing as tolerated    Pertinent Vitals/Pain C/o "soreness"    Mobility  Bed Mobility Details for Bed Mobility Assistance: Pt OOB in recliner. Transfers Transfers: Sit to Stand;Stand to Sit Sit to Stand: 4: Min assist;From chair/3-in-1 Stand to Sit: 4: Min assist;To chair/3-in-1 Details for Transfer Assistance: 25% VC's on proper technique and push up from chair.  Also, 59% VC's to avild hip flex > 90' during sit ti stand ti sit. Ambulation/Gait Ambulation/Gait Assistance: 4: Min assist Ambulation Distance (Feet): 90 Feet (45' x 2 with one sitting rest break) Assistive device: Rolling walker Ambulation/Gait Assistance Details: 50% VC's to ext rot R LE to decrease Int Rotation.  Gait Pattern: Step-to pattern;Narrow base of support;Trunk flexed;Decreased stance time - right Gait velocity: decreased Stairs: No Wheelchair Mobility Wheelchair Mobility: No     PT Goals                    progressing    Visit Information  Last PT Received On: 12/23/11 Assistance Needed: +1    Subjective Data      Cognition    good   Balance   poor -  End of Session PT - End of Session Equipment Utilized During Treatment: Gait belt Activity Tolerance: Patient limited by  fatigue Patient left: in chair;with call bell/phone within reach    Felecia Shelling  PTA [facility]  Acute  Rehab Pager     [phone]

## 2011-12-23 NOTE — Progress Notes (Signed)
Physical Therapy Treatment Patient Details Name: [name] MRN: [id number] DOB: 10/09/1925 Today's Date: 12/23/2011 Time: 1335-1400 PT Time Calculation (min): 25 min  PT Assessment / Plan / Recommendation Comments on Treatment Session  Son present and educated/demonstarted on proper tech to assist pt up 4 steps with one right rail.  Pt still weak and unsteady.  Advised son to alwayd assist pt when she gets up.  HIGH FALL RISK.  Son understood and agreed.  Pt plans to D/C to home today.    Follow Up Recommendations  Home health PT    Barriers to Discharge        Equipment Recommendations  None recommended by PT (has a RW and 3:1)    Recommendations for Other Services    Frequency 7X/week   Plan Discharge plan remains appropriate    Precautions / Restrictions   WBAT THP  Pertinent Vitals/Pain C/o fatigue and weakness    Mobility  Bed Mobility Details for Bed Mobility Assistance: Pt OOB in recliner Transfers Transfers: Sit to Stand;Stand to Sit Sit to Stand: 4: Min assist;From chair/3-in-1 Stand to Sit: To chair/3-in-1;4: Min assist Details for Transfer Assistance: 25% VC's on proper technique and push up from chair. Also, 50% VC's to avild hip flex > 90' during sit ti stand ti sit.  Ambulation/Gait Ambulation/Gait Assistance: 4: Min assist Ambulation Distance (Feet): 10 Feet (to and from stairs) Assistive device: Rolling walker Ambulation/Gait Assistance Details: 50% VC's to ext rot R LE to decrease Int Rotation. Gait Pattern: Step-to pattern;Narrow base of support;Decreased stance time - right;Decreased stride length Gait velocity: decreased/unsteady General Gait Details: Advised son to always be with pt when she gets up to walk, still weak and unsteady. Stairs: Yes Stairs Assistance: 4: Min assist;3: Mod assist Stairs Assistance Details (indicate cue type and reason): 50% VC's on proper tech and sequencing.  Son present and educated/demonstrated Stair Management  Technique: One rail Right;Forwards Number of Stairs: 4  Wheelchair Mobility Wheelchair Mobility: No    PT Goals                 progressing    Visit Information  Last PT Received On: 12/23/11 Assistance Needed: +1    Subjective Data      Cognition    fair +   Balance   poor  End of Session PT - End of Session Equipment Utilized During Treatment: Gait belt Activity Tolerance: Patient limited by fatigue Patient left: in chair;with call bell/phone within reach;with family/visitor present   Felecia Shelling  PTA [facility]  Acute  Rehab Pager     [phone]

## 2012-01-13 NOTE — Discharge Summary (Signed)
Physician Discharge Summary   Patient ID: [name] MRN: [id number] DOB/AGE: 1926-06-15 78 y.o.  Admit date: 12/18/2011 Discharge date: 12/23/2011  Primary Diagnosis: Recurrent right total hip dislocations   Admission Diagnoses:  Past Medical History  Diagnosis Date  . Hypothyroidism   . Atrial fibrillation   . Myositis     BODY  . LVH (left ventricular hypertrophy)     MILD  . Dizziness   . Headache   . Weakness   . Chest pain   . DOE (dyspnea on exertion)   . Poor appetite   . UTI (urinary tract infection)   . Dysphagia     History of dysphagia 2, depressed by esophagus status post dilation  . Arthritis   . Hypokalemia   . History of anxiety   . Orthostatic hypotension   . Inclusion body myositis   . Easy bruising   . Complication of anesthesia     anxiety before anesthesia, wants spinal   Discharge Diagnoses:   Active Problems:  Postop Acute blood loss anemia  Postop Transfusion  Procedure: Procedure(s) (LRB): TOTAL HIP REVISION (Right)   Consults: None  HPI: [name] is an 78 year old female who had a total hip arthroplasty on the right done approximately 12 years ago. Did very well until the past several months where she has had 2-3 dislocations. X-ray does not show any abnormality in the prosthetic positioning. 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 She presents now for revision to a constrained liner as she is very sedentary and constrained liner would provide her the best stability without having to change the other prosthetic parts.  Laboratory Data: Hospital Outpatient Visit on 12/16/2011  Component Date Value Range Status  . aPTT 12/16/2011 35  24 - 37 seconds Final  . WBC 12/16/2011 3.4* 4.0 - 10.5 K/uL Final  . RBC 12/16/2011 2.62* 3.87 - 5.11 MIL/uL Final  . Hemoglobin 12/16/2011 9.3* 12.0 - 15.0 g/dL Final  . HCT 40/98/1191 28.1* 36.0 - 46.0 % Final  . MCV 12/16/2011 107.3* 78.0 - 100.0 fL  Final  . MCH 12/16/2011 35.5* 26.0 - 34.0 pg Final  . MCHC 12/16/2011 33.1  30.0 - 36.0 g/dL Final  . RDW 47/82/9562 15.8* 11.5 - 15.5 % Final  . Platelets 12/16/2011 234  150 - 400 K/uL Final  . Sodium 12/16/2011 135  135 - 145 mEq/L Final  . Potassium 12/16/2011 3.9  3.5 - 5.1 mEq/L Final  . Chloride 12/16/2011 99  96 - 112 mEq/L Final  . CO2 12/16/2011 26  19 - 32 mEq/L Final  . Glucose, Bld 12/16/2011 81  70 - 99 mg/dL Final  . BUN 13/02/6577 24* 6 - 23 mg/dL Final  . Creatinine, Ser 12/16/2011 0.77  0.50 - 1.10 mg/dL Final  . Calcium 46/96/2952 9.4  8.4 - 10.5 mg/dL Final  . Total Protein 12/16/2011 6.7  6.0 - 8.3 g/dL Final  . Albumin 84/13/2440 3.3* 3.5 - 5.2 g/dL Final  . AST 05/04/2535 17  0 - 37 U/L Final  . ALT 12/16/2011 8  0 - 35 U/L Final  . Alkaline Phosphatase 12/16/2011 54  39 - 117 U/L Final  . Total Bilirubin 12/16/2011 0.3  0.3 - 1.2 mg/dL Final  . GFR calc non Af Amer 12/16/2011 75* >90 mL/min Final  . GFR calc Af Amer 12/16/2011 87* >90 mL/min Final   Comment:  The eGFR has been calculated                          using the CKD EPI equation.                          This calculation has not been                          validated in all clinical                          situations.                          eGFR's persistently                          <90 mL/min signify                          possible Chronic Kidney Disease.  Marland Kitchen Prothrombin Time 12/16/2011 12.6  11.6 - 15.2 seconds Final  . INR 12/16/2011 0.92  0.00 - 1.49 Final  . Color, Urine 12/16/2011 AMBER* YELLOW Final   BIOCHEMICALS MAY BE AFFECTED BY COLOR  . APPearance 12/16/2011 CLOUDY* CLEAR Final  . Specific Gravity, Urine 12/16/2011 1.022  1.005 - 1.030 Final  . pH 12/16/2011 5.5  5.0 - 8.0 Final  . Glucose, UA 12/16/2011 NEGATIVE  NEGATIVE mg/dL Final  . Hgb urine dipstick 12/16/2011 SMALL* NEGATIVE Final  . Bilirubin Urine 12/16/2011 SMALL* NEGATIVE Final  .  Ketones, ur 12/16/2011 TRACE* NEGATIVE mg/dL Final  . Protein, ur 16/04/9603 NEGATIVE  NEGATIVE mg/dL Final  . Urobilinogen, UA 12/16/2011 1.0  0.0 - 1.0 mg/dL Final  . Nitrite 54/03/8118 NEGATIVE  NEGATIVE Final  . Leukocytes, UA 12/16/2011 LARGE* NEGATIVE Final  . MRSA, PCR 12/16/2011 NEGATIVE  NEGATIVE Final  . Staphylococcus aureus 12/16/2011 POSITIVE* NEGATIVE Final   Comment:                                 The Xpert SA Assay (FDA                          approved for NASAL specimens                          only), is one component of                          a comprehensive surveillance                          program.  It is not intended                          to diagnose infection nor to                          guide or monitor treatment.  . Squamous Epithelial / LPF 12/16/2011 FEW* RARE Final  . WBC, UA 12/16/2011 11-20  <3 WBC/hpf Final  .  RBC / HPF 12/16/2011 3-6  <3 RBC/hpf Final  . Bacteria, UA 12/16/2011 FEW* RARE Final  . Urine-Other 12/16/2011 MUCOUS PRESENT   Final   No results found for this basename: HGB:5 in the last 72 hours No results found for this basename: WBC:2,RBC:2,HCT:2,PLT:2 in the last 72 hours No results found for this basename: NA:2,K:2,CL:2,CO2:2,BUN:2,CREATININE:2,GLUCOSE:2,CALCIUM:2 in the last 72 hours No results found for this basename: LABPT:2,INR:2 in the last 72 hours  X-Rays:Dg Hip Complete Right  12/16/2011  *RADIOLOGY REPORT*  Clinical Data: Preoperative evaluation for total hip revision.  RIGHT HIP - COMPLETE 2+ VIEW  Comparison: 10/24/2011  Findings: Frontal pelvis shows diffuse demineralization.  The patient is status post bilateral hip replacement.  No interval change when given the slightly different positioning between the two exams.  IMPRESSION: Status post right total hip replacement without interval change.  Original Report Authenticated By: ERIC A. MANSELL, M.D.   Dg Pelvis Portable  12/18/2011  *RADIOLOGY REPORT*  Clinical  Data: Postop right hip.  PORTABLE PELVIS  Comparison: 07/28/2010.  Findings: Revision right hip prosthesis without complication noted on this single projection.  Surgical drain is in place.  Remote left hip prosthesis placement.  Scoliosis lumbar spine with degenerative changes.  Prominent stool rectosigmoid region.  IMPRESSION: Revision right hip prosthesis without complication noted.  Original Report Authenticated By: Fuller Canada, M.D.    EKG: Orders placed in visit on 12/05/11  . EKG 12-LEAD     Hospital Course: Patient was admitted to [facility] and taken to the OR and underwent the above state procedure without complications.  Patient tolerated the procedure well and was later transferred to the recovery room and then to the orthopaedic floor for postoperative care.  They were given PO and IV analgesics for pain control following their surgery.  They were given 24 hours of postoperative antibiotics and started on DVT prophylaxis in the form of Xarelto.   PT and OT were ordered for total hip protocol.  The patient was allowed to be WBAT with therapy. Discharge planning was consulted to help with postop disposition and equipment needs.  Originally, she was looking to go home but would look into a SNF for rehab if she didn't progress well. Patient had a decent night on the evening of surgery and started to get up OOB with therapy on day one.  Hemovac drain was pulled without difficulty.  The knee immobilizer was removed and discontinued.  Continued to work with therapy into day two.  Dressing was changed on day two and the incision was healing well.  By day three, incision was healing well but she was not ready for discharge.  She continued to receive therapy each day over the weekend.  On POD 5, Monday 12/23/2011, the patient was seen in rounds and was ready to go home since her therapy was going better and she has been able to walk a little farther.   Discharge Medications: Prior to  Admission medications   Medication Sig Start Date End Date Taking? Authorizing Provider  albuterol (PROVENTIL HFA;VENTOLIN HFA) 108 (90 BASE) MCG/ACT inhaler Inhale 2 puffs into the lungs every 6 (six) hours as needed. Shortness of breath   Yes Historical Provider, MD  ALPRAZolam (XANAX) 0.5 MG tablet Take 0.5 mg by mouth 2 (two) times daily as needed. anxiety   Yes Historical Provider, MD  azaTHIOprine (IMURAN) 50 MG tablet Take 100 mg by mouth daily. Takes 2 tablets As directed 04/06/11  Yes Historical Provider, MD  fluticasone (FLONASE) 50 MCG/ACT nasal spray Place 2 sprays into the nose daily. 09/07/11 09/06/12 Yes Gwyneth Sprout, MD  isosorbide dinitrate (ISORDIL) 10 MG tablet Take 10 mg by mouth 3 (three) times daily.  10/10/11  Yes Cassell Clement, MD  levothyroxine (SYNTHROID, LEVOTHROID) 75 MCG tablet Take 75 mcg by mouth daily.    Yes Historical Provider, MD  meclizine (ANTIVERT) 25 MG tablet Take 25 mg by mouth 3 (three) times daily as needed. dizziness   Yes Historical Provider, MD  metoprolol tartrate (LOPRESSOR) 25 MG tablet Take 12.5 mg by mouth every morning. 1/2 daily 12/05/10  Yes Cassell Clement, MD  traMADol (ULTRAM) 50 MG tablet Take 50-100 mg by mouth every 8 (eight) hours as needed. pain   Yes Historical Provider, MD  rivaroxaban (XARELTO) 10 MG TABS tablet Take 1 tablet (10 mg total) by mouth daily with breakfast. 12/23/11   Amber Tamala Ser, PA    Diet: Regular diet Activity:WBAT No bending hip over 90 degrees- A "L" Angle Do not cross legs Do not let foot roll inward When turning these patients a pillow should be placed between the patient's legs to prevent crossing. Patients should have the affected knee fully extended when trying to sit or stand from all surfaces to prevent excessive hip flexion. When ambulating and turning toward the affected side the affected leg should have the toes turned out prior to moving the walker and the rest of patient's body as to prevent  internal rotation/ turning in of the leg. Abduction pillows are the most effective way to prevent a patient from not crossing legs or turning toes in at rest. If an abduction pillow is not ordered placing a regular pillow length wise between the patient's legs is also an effective reminder. It is imperative that these precautions be maintained so that the surgical hip does not dislocate. Follow-up:in 2 weeks Disposition - Home Discharged Condition: fair   Discharge Orders    Future Orders Please Complete By Expires   Diet - low sodium heart healthy      Call MD / Call 911      Comments:   If you experience chest pain or shortness of breath, CALL 911 and be transported to the hospital emergency room.  If you develope a fever above 101 F, pus (white drainage) or increased drainage or redness at the wound, or calf pain, call your surgeon's office.   Constipation Prevention      Comments:   Drink plenty of fluids.  Prune juice may be helpful.  You may use a stool softener, such as Colace (over the counter) 100 mg twice a day.  Use MiraLax (over the counter) for constipation as needed.   Increase activity slowly as tolerated      Discharge instructions      Comments:   Pick up stool softner and laxative for home. Do not submerge incision under water. May shower. Continue to use ice for pain and swelling from surgery. May resume taking supplements and vitamins after completion of 3 weeks of Xarelto Hip precautions.  Total Hip Protocol. Follow up in office 2 weeks after surgery   Driving restrictions      Comments:   No driving   Change dressing      Comments:   You may change your dressing daily with sterile 4 x 4 inch gauze dressing and paper tape.   TED hose      Comments:   Use stockings (TED hose) for 3 weeks  on both leg(s).  You may remove them at night for sleeping.   Follow the hip precautions as taught in Physical Therapy        Medication List  As of 01/13/2012  8:00 AM    STOP taking these medications         aspirin 81 MG tablet      ferrous sulfate 325 (65 FE) MG tablet      multivitamin with minerals Tabs         TAKE these medications         albuterol 108 (90 BASE) MCG/ACT inhaler   Commonly known as: PROVENTIL HFA;VENTOLIN HFA   Inhale 2 puffs into the lungs every 6 (six) hours as needed. Shortness of breath      ALPRAZolam 0.5 MG tablet   Commonly known as: XANAX   Take 0.5 mg by mouth 2 (two) times daily as needed. anxiety      azaTHIOprine 50 MG tablet   Commonly known as: IMURAN   Take 100 mg by mouth daily. Takes 2 tablets As directed      fluticasone 50 MCG/ACT nasal spray   Commonly known as: FLONASE   Place 2 sprays into the nose daily.      isosorbide dinitrate 10 MG tablet   Commonly known as: ISORDIL   Take 10 mg by mouth 3 (three) times daily.      levothyroxine 75 MCG tablet   Commonly known as: SYNTHROID, LEVOTHROID   Take 75 mcg by mouth daily.      meclizine 25 MG tablet   Commonly known as: ANTIVERT   Take 25 mg by mouth 3 (three) times daily as needed. dizziness      metoprolol tartrate 25 MG tablet   Commonly known as: LOPRESSOR   Take 12.5 mg by mouth every morning. 1/2 daily      rivaroxaban 10 MG Tabs tablet   Commonly known as: XARELTO   Take 1 tablet (10 mg total) by mouth daily with breakfast.      traMADol 50 MG tablet   Commonly known as: ULTRAM   Take 50-100 mg by mouth every 8 (eight) hours as needed. pain             Signed: Patrica Duel 01/13/2012, 8:00 AM

## 2012-01-21 ENCOUNTER — Telehealth: Payer: Self-pay | Admitting: *Deleted

## 2012-01-21 NOTE — Telephone Encounter (Signed)
Dr. Daleen Squibb spoke with Herb Grays, MD regarding pt.  Dr. Daleen Squibb advised pt to stop Isordil due to low b/p.

## 2012-02-10 ENCOUNTER — Other Ambulatory Visit: Payer: Self-pay | Admitting: *Deleted

## 2012-02-10 MED ORDER — METOPROLOL TARTRATE 25 MG PO TABS: 12.5000 mg | ORAL_TABLET | Freq: Every morning | ORAL | Status: DC

## 2012-02-10 NOTE — Telephone Encounter (Signed)
Refilled metoprolol taking 1/2 of a 25mg  tablet

## 2012-03-25 ENCOUNTER — Other Ambulatory Visit: Payer: Medicare Other

## 2012-03-26 ENCOUNTER — Other Ambulatory Visit: Payer: Self-pay | Admitting: *Deleted

## 2012-03-26 ENCOUNTER — Other Ambulatory Visit: Payer: Medicare Other

## 2012-03-27 ENCOUNTER — Other Ambulatory Visit: Payer: Medicare Other

## 2012-05-04 ENCOUNTER — Encounter (HOSPITAL_COMMUNITY): Payer: Self-pay | Admitting: *Deleted

## 2012-05-04 ENCOUNTER — Emergency Department (HOSPITAL_COMMUNITY): Payer: Medicare Other

## 2012-05-04 ENCOUNTER — Inpatient Hospital Stay (HOSPITAL_COMMUNITY)
Admission: EM | Admit: 2012-05-04 | Discharge: 2012-05-10 | DRG: 391 | Disposition: A | Discharge status: Skilled Nursing Facility | Payer: Medicare Other | Attending: Internal Medicine | Admitting: Internal Medicine

## 2012-05-04 DIAGNOSIS — R5381 Other malaise: Secondary | ICD-10-CM | POA: Diagnosis present

## 2012-05-04 DIAGNOSIS — F3289 Other specified depressive episodes: Secondary | ICD-10-CM | POA: Diagnosis present

## 2012-05-04 DIAGNOSIS — Z681 Body mass index (BMI) 19 or less, adult: Secondary | ICD-10-CM

## 2012-05-04 DIAGNOSIS — Z79899 Other long term (current) drug therapy: Secondary | ICD-10-CM

## 2012-05-04 DIAGNOSIS — W19XXXA Unspecified fall, initial encounter: Secondary | ICD-10-CM

## 2012-05-04 DIAGNOSIS — D500 Iron deficiency anemia secondary to blood loss (chronic): Secondary | ICD-10-CM | POA: Diagnosis present

## 2012-05-04 DIAGNOSIS — G7241 Inclusion body myositis [IBM]: Secondary | ICD-10-CM | POA: Diagnosis present

## 2012-05-04 DIAGNOSIS — F329 Major depressive disorder, single episode, unspecified: Secondary | ICD-10-CM | POA: Diagnosis present

## 2012-05-04 DIAGNOSIS — R197 Diarrhea, unspecified: Secondary | ICD-10-CM

## 2012-05-04 DIAGNOSIS — A09 Infectious gastroenteritis and colitis, unspecified: Principal | ICD-10-CM | POA: Diagnosis present

## 2012-05-04 DIAGNOSIS — R51 Headache: Secondary | ICD-10-CM | POA: Diagnosis present

## 2012-05-04 DIAGNOSIS — E872 Acidosis, unspecified: Secondary | ICD-10-CM | POA: Diagnosis present

## 2012-05-04 DIAGNOSIS — Z885 Allergy status to narcotic agent status: Secondary | ICD-10-CM

## 2012-05-04 DIAGNOSIS — E860 Dehydration: Secondary | ICD-10-CM | POA: Diagnosis present

## 2012-05-04 DIAGNOSIS — I951 Orthostatic hypotension: Secondary | ICD-10-CM | POA: Diagnosis present

## 2012-05-04 DIAGNOSIS — R627 Adult failure to thrive: Secondary | ICD-10-CM | POA: Diagnosis present

## 2012-05-04 DIAGNOSIS — F418 Other specified anxiety disorders: Secondary | ICD-10-CM | POA: Diagnosis present

## 2012-05-04 DIAGNOSIS — R195 Other fecal abnormalities: Secondary | ICD-10-CM | POA: Diagnosis present

## 2012-05-04 DIAGNOSIS — Z888 Allergy status to other drugs, medicaments and biological substances status: Secondary | ICD-10-CM

## 2012-05-04 DIAGNOSIS — Z7982 Long term (current) use of aspirin: Secondary | ICD-10-CM

## 2012-05-04 DIAGNOSIS — E41 Nutritional marasmus: Secondary | ICD-10-CM | POA: Diagnosis present

## 2012-05-04 DIAGNOSIS — Z8781 Personal history of (healed) traumatic fracture: Secondary | ICD-10-CM

## 2012-05-04 DIAGNOSIS — R64 Cachexia: Secondary | ICD-10-CM | POA: Diagnosis present

## 2012-05-04 DIAGNOSIS — B961 Klebsiella pneumoniae [K. pneumoniae] as the cause of diseases classified elsewhere: Secondary | ICD-10-CM | POA: Diagnosis not present

## 2012-05-04 DIAGNOSIS — IMO0002 Reserved for concepts with insufficient information to code with codable children: Secondary | ICD-10-CM

## 2012-05-04 DIAGNOSIS — R1310 Dysphagia, unspecified: Secondary | ICD-10-CM | POA: Diagnosis present

## 2012-05-04 DIAGNOSIS — E46 Unspecified protein-calorie malnutrition: Secondary | ICD-10-CM | POA: Diagnosis present

## 2012-05-04 DIAGNOSIS — E871 Hypo-osmolality and hyponatremia: Secondary | ICD-10-CM | POA: Diagnosis present

## 2012-05-04 DIAGNOSIS — Z66 Do not resuscitate: Secondary | ICD-10-CM | POA: Diagnosis present

## 2012-05-04 DIAGNOSIS — F411 Generalized anxiety disorder: Secondary | ICD-10-CM | POA: Diagnosis present

## 2012-05-04 DIAGNOSIS — R296 Repeated falls: Secondary | ICD-10-CM | POA: Diagnosis present

## 2012-05-04 DIAGNOSIS — I4891 Unspecified atrial fibrillation: Secondary | ICD-10-CM | POA: Diagnosis present

## 2012-05-04 DIAGNOSIS — I480 Paroxysmal atrial fibrillation: Secondary | ICD-10-CM | POA: Diagnosis present

## 2012-05-04 DIAGNOSIS — E039 Hypothyroidism, unspecified: Secondary | ICD-10-CM | POA: Diagnosis present

## 2012-05-04 DIAGNOSIS — D7589 Other specified diseases of blood and blood-forming organs: Secondary | ICD-10-CM | POA: Diagnosis present

## 2012-05-04 DIAGNOSIS — R634 Abnormal weight loss: Secondary | ICD-10-CM | POA: Diagnosis present

## 2012-05-04 DIAGNOSIS — R55 Syncope and collapse: Secondary | ICD-10-CM

## 2012-05-04 DIAGNOSIS — K922 Gastrointestinal hemorrhage, unspecified: Secondary | ICD-10-CM

## 2012-05-04 DIAGNOSIS — N390 Urinary tract infection, site not specified: Secondary | ICD-10-CM | POA: Diagnosis present

## 2012-05-04 DIAGNOSIS — E876 Hypokalemia: Secondary | ICD-10-CM | POA: Diagnosis not present

## 2012-05-04 DIAGNOSIS — Z96649 Presence of unspecified artificial hip joint: Secondary | ICD-10-CM

## 2012-05-04 DIAGNOSIS — M6281 Muscle weakness (generalized): Secondary | ICD-10-CM | POA: Diagnosis present

## 2012-05-04 DIAGNOSIS — J309 Allergic rhinitis, unspecified: Secondary | ICD-10-CM | POA: Diagnosis not present

## 2012-05-04 DIAGNOSIS — D638 Anemia in other chronic diseases classified elsewhere: Secondary | ICD-10-CM | POA: Diagnosis present

## 2012-05-04 DIAGNOSIS — Z9181 History of falling: Secondary | ICD-10-CM

## 2012-05-04 HISTORY — DX: Other specified diseases of blood and blood-forming organs: D75.89

## 2012-05-04 HISTORY — DX: Unspecified protein-calorie malnutrition: E46

## 2012-05-04 HISTORY — DX: Reserved for concepts with insufficient information to code with codable children: IMO0002

## 2012-05-04 HISTORY — DX: Diarrhea, unspecified: R19.7

## 2012-05-04 LAB — CBC WITH DIFFERENTIAL/PLATELET
Basophils Relative: 0 % (ref 0–1)
Hemoglobin: 12.2 g/dL (ref 12.0–15.0)
Lymphocytes Relative: 9 % — ABNORMAL LOW (ref 12–46)
Lymphs Abs: 0.5 10*3/uL — ABNORMAL LOW (ref 0.7–4.0)
Monocytes Relative: 6 % (ref 3–12)
Neutro Abs: 4.5 10*3/uL (ref 1.7–7.7)
Neutrophils Relative %: 85 % — ABNORMAL HIGH (ref 43–77)
RBC: 3.34 MIL/uL — ABNORMAL LOW (ref 3.87–5.11)
WBC: 5.3 10*3/uL (ref 4.0–10.5)

## 2012-05-04 LAB — COMPREHENSIVE METABOLIC PANEL
Albumin: 3.5 g/dL (ref 3.5–5.2)
Alkaline Phosphatase: 88 U/L (ref 39–117)
BUN: 26 mg/dL — ABNORMAL HIGH (ref 6–23)
Chloride: 96 mEq/L (ref 96–112)
Potassium: 4 mEq/L (ref 3.5–5.1)
Total Bilirubin: 0.5 mg/dL (ref 0.3–1.2)

## 2012-05-04 LAB — LIPASE, BLOOD: Lipase: 38 U/L (ref 11–59)

## 2012-05-04 MED ORDER — SODIUM CHLORIDE 0.9 % IV SOLN
INTRAVENOUS | Status: DC
  Administered 2012-05-04: 22:00:00 via INTRAVENOUS

## 2012-05-04 MED ORDER — SODIUM CHLORIDE 0.9 % IV BOLUS (SEPSIS)
250.0000 mL | Freq: Once | INTRAVENOUS | Status: AC
  Administered 2012-05-04: 250 mL via INTRAVENOUS

## 2012-05-04 NOTE — ED Notes (Signed)
During orthostatic vital signs patient complained while standing stating "im going to fall, I'm going to fall" patient would not support her weight with her own legs, rather depending on staff and family to support her weight. Patient became weak and unresponsive while standing. Placed patient back in bed and placed patient on cardiac monitor. Patient then became responsive to voice and responded appropriately when spoken to by family. Patient stated "i do not want to stand up." no other needs voiced by family or patient at this time.

## 2012-05-04 NOTE — ED Notes (Signed)
Pt arrived via ems from home d/t defecating uncontrollably. Per ems pt was found in shower unconscious with feces dried to her back side. Pt woke and told ems she had asked to be put in bed d/t fatigue. Pt lives with her son at home and has another family member that comes and cleans her once a day. Family reports to ems pt is not eating well and has been increasingly weak. Pt arrives to ed with feces over most of backs and legs.

## 2012-05-04 NOTE — ED Provider Notes (Signed)
History     CSN: [id number]  Arrival date & time 05/04/12  2054   First MD Initiated Contact with Patient 05/04/12 2124      Chief Complaint  Patient presents with  . Failure To Thrive     HPI Pt was seen at 2130.  Per pt and her family, c/o sudden onset and resolution of one episode of syncope that occurred PTA.  Family found pt "unresponsive" in the shower PTA.  Family states pt woke up and had copious "black diarrhea."  Pt c/o increasing generalized weakness/fatigue, as well as not eating or drinking well for the past sevral days.  Denies abd pain, no back pain, no N/V, no fevers, no CP/SOB, no focal motor weakness, no tingling/numbness in extremities, no facial droop, no slurred speech, no seizure activity.    Past Medical History  Diagnosis Date  . Hypothyroidism   . Atrial fibrillation   . Myositis     BODY  . LVH (left ventricular hypertrophy)     MILD  . Dizziness   . Headache   . Weakness   . Chest pain   . DOE (dyspnea on exertion)   . Poor appetite   . UTI (urinary tract infection)   . Dysphagia     History of dysphagia 2, depressed by esophagus status post dilation  . Arthritis   . Hypokalemia   . History of anxiety   . Orthostatic hypotension   . Inclusion body myositis   . Easy bruising   . Complication of anesthesia     anxiety before anesthesia, wants spinal    Past Surgical History  Procedure Date  . Total hip arthroplasty 2000 or 2001    right  . Transthoracic echocardiogram 07/08/10    EF 55-60%  . Left total hip arthroplasty 1996  . Total hip revision 12/18/2011    Procedure: TOTAL HIP REVISION;  Surgeon: Loanne Drilling, MD;  Location: [facility];  Service: Orthopedics;  Laterality: Right;  Right Hip Revision to Constrained Liner-acetabular     History  Substance Use Topics  . Smoking status: Never Smoker   . Smokeless tobacco: Never Used  . Alcohol Use: No    Review of Systems ROS: Statement: All systems negative except as marked or  noted in the HPI; Constitutional: Negative for fever and chills. ; ; Eyes: Negative for eye pain, redness and discharge. ; ; ENMT: Negative for ear pain, hoarseness, nasal congestion, sinus pressure and sore throat. ; ; Cardiovascular: Negative for chest pain, palpitations, diaphoresis, dyspnea and peripheral edema. ; ; Respiratory: Negative for cough, wheezing and stridor. ; ; Gastrointestinal: +diarrhea. Negative for nausea, vomiting, abdominal pain, blood in stool, hematemesis, jaundice and rectal bleeding. . ; ; Genitourinary: Negative for dysuria, flank pain and hematuria. ; ; Musculoskeletal: Negative for back pain and neck pain. Negative for swelling and trauma.; ; Skin: Negative for pruritus, rash, abrasions, blisters, bruising and skin lesion.; ; Neuro: Negative for headache, lightheadedness and neck stiffness. Negative for altered level of consciousness , altered mental status, extremity weakness, paresthesias, involuntary movement, seizure and +generalized weakness, syncope.      Allergies  Codeine; Morphine and related; and Piroxicam  Home Medications   Current Outpatient Rx  Name Route Sig Dispense Refill  . ALPRAZOLAM 0.5 MG PO TABS Oral Take 0.5 mg by mouth 2 (two) times daily as needed. anxiety    . ASPIRIN 81 MG PO CHEW Oral Chew 81 mg by mouth daily.    . AZATHIOPRINE  50 MG PO TABS Oral Take 100 mg by mouth daily. Takes 2 tablets As directed    . LEVOTHYROXINE SODIUM 75 MCG PO TABS Oral Take 75 mcg by mouth daily.     Marland Kitchen METOPROLOL TARTRATE 25 MG PO TABS Oral Take 12.5 mg by mouth every morning.    Marland Kitchen TRAMADOL HCL 50 MG PO TABS Oral Take 50-100 mg by mouth 3 (three) times daily as needed. pain    . ALBUTEROL SULFATE HFA 108 (90 BASE) MCG/ACT IN AERS Inhalation Inhale 2 puffs into the lungs every 6 (six) hours as needed. Shortness of breath    . FLUTICASONE PROPIONATE 50 MCG/ACT NA SUSP Nasal Place 2 sprays into the nose daily. 16 g 2  . ISOSORBIDE DINITRATE 10 MG PO TABS Oral Take  10 mg by mouth 3 (three) times daily.     Marland Kitchen MECLIZINE HCL 25 MG PO TABS Oral Take 25 mg by mouth 3 (three) times daily as needed. dizziness    . RIVAROXABAN 10 MG PO TABS Oral Take 1 tablet (10 mg total) by mouth daily with breakfast. 18 tablet 0    BP 120/97  Pulse 85  Temp 98.2 F (36.8 C) (Oral)  Resp 22  Ht 5\' 5"  (1.651 m)  Wt 82 lb (37.195 kg)  BMI 13.65 kg/m2  SpO2 100%  Physical Exam 2135: Physical examination:  Nursing notes reviewed; Vital signs and O2 SAT reviewed;  Constitutional: Thin, frail, In no acute distress; Head:  Normocephalic, atraumatic; Eyes: EOMI, PERRL, No scleral icterus; ENMT: Mouth and pharynx normal, Mucous membranes dry; Neck: Supple, Full range of motion, No lymphadenopathy; Cardiovascular: Irregular irregular rate and rhythm, No gallop; Respiratory: Breath sounds clear & equal bilaterally, No wheezes.  Speaking full sentences with ease, Normal respiratory effort/excursion; Chest: Nontender, Movement normal; Abdomen: Soft, Nontender, Nondistended, Normal bowel sounds. Rectal exam performed w/permission of pt and ED RN chaparone present.  Anal tone normal.  Non-tender, soft black stool in rectal vault, heme positive.  No fissures, no external hemorrhoids, no palp masses.;; Extremities: Pulses normal, No tenderness, No edema, No calf edema or asymmetry.; Neuro: AA&Ox3, Major CN grossly intact.  Speech clear. No facial droop. Moves all ext without apparent gross focal motor deficits.; Skin: Color normal, Warm, Dry.   ED Course  Procedures     MDM  MDM Reviewed: nursing note, vitals and previous chart Reviewed previous: ECG and labs Interpretation: labs, ECG and x-ray     Date: 05/04/2012  Rate: 100  Rhythm: atrial fibrillation  QRS Axis: normal  Intervals: normal  ST/T Wave abnormalities: nonspecific T wave changes  Conduction Disutrbances:none  Narrative Interpretation:   Old EKG Reviewed: changes noted; NSR on previous EKG dated  12/21/2010.  Results for orders placed during the hospital encounter of 05/04/12  CBC WITH DIFFERENTIAL      Component Value Range   WBC 5.3  4.0 - 10.5 K/uL   RBC 3.34 (*) 3.87 - 5.11 MIL/uL   Hemoglobin 12.2  12.0 - 15.0 g/dL   HCT 16.1 (*) 09.6 - 04.5 %   MCV 106.9 (*) 78.0 - 100.0 fL   MCH 36.5 (*) 26.0 - 34.0 pg   MCHC 34.2  30.0 - 36.0 g/dL   RDW 40.9 (*) 81.1 - 91.4 %   Platelets 269  150 - 400 K/uL   Neutrophils Relative 85 (*) 43 - 77 %   Neutro Abs 4.5  1.7 - 7.7 K/uL   Lymphocytes Relative 9 (*) 12 - 46 %  Lymphs Abs 0.5 (*) 0.7 - 4.0 K/uL   Monocytes Relative 6  3 - 12 %   Monocytes Absolute 0.3  0.1 - 1.0 K/uL   Eosinophils Relative 0  0 - 5 %   Eosinophils Absolute 0.0  0.0 - 0.7 K/uL   Basophils Relative 0  0 - 1 %   Basophils Absolute 0.0  0.0 - 0.1 K/uL  COMPREHENSIVE METABOLIC PANEL      Component Value Range   Sodium 132 (*) 135 - 145 mEq/L   Potassium 4.0  3.5 - 5.1 mEq/L   Chloride 96  96 - 112 mEq/L   CO2 24  19 - 32 mEq/L   Glucose, Bld 102 (*) 70 - 99 mg/dL   BUN 26 (*) 6 - 23 mg/dL   Creatinine, Ser 8.29  0.50 - 1.10 mg/dL   Calcium 9.9  8.4 - 56.2 mg/dL   Total Protein 7.4  6.0 - 8.3 g/dL   Albumin 3.5  3.5 - 5.2 g/dL   AST 23  0 - 37 U/L   ALT 10  0 - 35 U/L   Alkaline Phosphatase 88  39 - 117 U/L   Total Bilirubin 0.5  0.3 - 1.2 mg/dL   GFR calc non Af Amer 59 (*) >90 mL/min   GFR calc Af Amer 69 (*) >90 mL/min  LIPASE, BLOOD      Component Value Range   Lipase 38  11 - 59 U/L  LACTIC ACID, PLASMA      Component Value Range   Lactic Acid, Venous 3.3 (*) 0.5 - 2.2 mmol/L  TROPONIN I      Component Value Range   Troponin I <0.30  <0.30 ng/mL   Dg Abd Acute W/chest 05/04/2012  *RADIOLOGY REPORT*  Clinical Data: Failure to thrive.  ACUTE ABDOMEN SERIES (ABDOMEN 2 VIEW & CHEST 1 VIEW)  Comparison: Plain film of the chest 11/02/2010.  Findings: Single view of the chest demonstrates pulmonary hyperexpansion and attenuation of the pulmonary  vasculature suggesting emphysema.  The lungs are clear.  Heart size is normal.  Two views of the abdomen show no free intraperitoneal air.  The bowel gas pattern is unremarkable.  Bilateral hip replacements and convex left scoliosis are noted.  IMPRESSION: No acute finding chest or abdomen.   Original Report Authenticated By: Bernadene Bell. D'ALESSIO, M.D.      2330:  Pt stood for orthostatic VS and had what appeared to be syncopal episode (became very weak and slumped over).  No apnea or pulselessness.  IVF bolus given with SBP increasing from 80 to 100.  T/C to [facility] Dr. Phillips Odor, case discussed, including:  HPI, pertinent PM/SHx, VS/PE, dx testing, ED course and treatment:  Agreeable to admit.           Laray Anger, DO 05/07/12 1236

## 2012-05-04 NOTE — ED Notes (Signed)
Call placed to adult protective services. [name] [phone]. Call if pt is not admitted.

## 2012-05-04 NOTE — ED Notes (Signed)
Pt presents via EMS secondary to weakness and increased stool output. Pt cleaned of dried on stool from buttocks, up her back and bilateral legs. Family states pt does not want to assist in her care. And is becoming weaker. Pt is alert and oriented x 4. NAD noted

## 2012-05-04 NOTE — ED Notes (Addendum)
Cleaned patient, placed patient in adult diaper and hospital gown, gave patient warm blankets at patient request. No other needs voiced at this time.

## 2012-05-05 ENCOUNTER — Encounter (HOSPITAL_COMMUNITY): Payer: Self-pay | Admitting: Internal Medicine

## 2012-05-05 DIAGNOSIS — IMO0002 Reserved for concepts with insufficient information to code with codable children: Secondary | ICD-10-CM | POA: Diagnosis present

## 2012-05-05 DIAGNOSIS — F418 Other specified anxiety disorders: Secondary | ICD-10-CM | POA: Diagnosis present

## 2012-05-05 DIAGNOSIS — J309 Allergic rhinitis, unspecified: Secondary | ICD-10-CM | POA: Diagnosis not present

## 2012-05-05 DIAGNOSIS — E876 Hypokalemia: Secondary | ICD-10-CM | POA: Diagnosis not present

## 2012-05-05 DIAGNOSIS — I951 Orthostatic hypotension: Secondary | ICD-10-CM | POA: Diagnosis present

## 2012-05-05 DIAGNOSIS — D7589 Other specified diseases of blood and blood-forming organs: Secondary | ICD-10-CM | POA: Diagnosis present

## 2012-05-05 DIAGNOSIS — R296 Repeated falls: Secondary | ICD-10-CM | POA: Diagnosis present

## 2012-05-05 DIAGNOSIS — W19XXXA Unspecified fall, initial encounter: Secondary | ICD-10-CM | POA: Diagnosis present

## 2012-05-05 DIAGNOSIS — E860 Dehydration: Secondary | ICD-10-CM | POA: Diagnosis present

## 2012-05-05 DIAGNOSIS — G7241 Inclusion body myositis [IBM]: Secondary | ICD-10-CM | POA: Diagnosis present

## 2012-05-05 DIAGNOSIS — E872 Acidosis: Secondary | ICD-10-CM | POA: Diagnosis present

## 2012-05-05 DIAGNOSIS — R197 Diarrhea, unspecified: Secondary | ICD-10-CM | POA: Diagnosis present

## 2012-05-05 DIAGNOSIS — R6889 Other general symptoms and signs: Secondary | ICD-10-CM

## 2012-05-05 DIAGNOSIS — R55 Syncope and collapse: Secondary | ICD-10-CM

## 2012-05-05 DIAGNOSIS — R1310 Dysphagia, unspecified: Secondary | ICD-10-CM | POA: Diagnosis present

## 2012-05-05 DIAGNOSIS — E46 Unspecified protein-calorie malnutrition: Secondary | ICD-10-CM | POA: Diagnosis present

## 2012-05-05 DIAGNOSIS — Z8781 Personal history of (healed) traumatic fracture: Secondary | ICD-10-CM

## 2012-05-05 LAB — PHOSPHORUS: Phosphorus: 2.6 mg/dL (ref 2.3–4.6)

## 2012-05-05 LAB — FOLATE: Folate: >20 ng/mL

## 2012-05-05 LAB — IRON AND TIBC
Saturation Ratios: 9 % — ABNORMAL LOW (ref 20–55)
TIBC: 204 ug/dL — ABNORMAL LOW (ref 250–470)
UIBC: 186 ug/dL (ref 125–400)

## 2012-05-05 LAB — BASIC METABOLIC PANEL
CO2: 22 mEq/L (ref 19–32)
Calcium: 8.5 mg/dL (ref 8.4–10.5)
Chloride: 104 mEq/L (ref 96–112)
Glucose, Bld: 121 mg/dL — ABNORMAL HIGH (ref 70–99)
Potassium: 3 mEq/L — ABNORMAL LOW (ref 3.5–5.1)
Sodium: 135 mEq/L (ref 135–145)

## 2012-05-05 LAB — CBC
HCT: 28.5 % — ABNORMAL LOW (ref 36.0–46.0)
Hemoglobin: 9.8 g/dL — ABNORMAL LOW (ref 12.0–15.0)
MCH: 36.3 pg — ABNORMAL HIGH (ref 26.0–34.0)
MCV: 105.6 fL — ABNORMAL HIGH (ref 78.0–100.0)
RBC: 2.7 MIL/uL — ABNORMAL LOW (ref 3.87–5.11)

## 2012-05-05 LAB — FERRITIN: Ferritin: 924 ng/mL — ABNORMAL HIGH (ref 10–291)

## 2012-05-05 LAB — URINALYSIS, ROUTINE W REFLEX MICROSCOPIC
Bilirubin Urine: NEGATIVE
Specific Gravity, Urine: 1.02 (ref 1.005–1.030)
Urobilinogen, UA: 0.2 mg/dL (ref 0.0–1.0)
pH: 7.5 (ref 5.0–8.0)

## 2012-05-05 LAB — URINE MICROSCOPIC-ADD ON

## 2012-05-05 LAB — RETICULOCYTES: Retic Ct Pct: 1 % (ref 0.4–3.1)

## 2012-05-05 MED ORDER — ONDANSETRON HCL 4 MG PO TABS: 4.0000 mg | ORAL_TABLET | Freq: Four times a day (QID) | ORAL | Status: DC | PRN

## 2012-05-05 MED ORDER — ALBUTEROL SULFATE (5 MG/ML) 0.5% IN NEBU: 2.5000 mg | INHALATION_SOLUTION | RESPIRATORY_TRACT | Status: DC | PRN

## 2012-05-05 MED ORDER — ACETAMINOPHEN 650 MG RE SUPP: 650.0000 mg | Freq: Four times a day (QID) | RECTAL | Status: DC | PRN

## 2012-05-05 MED ORDER — THIAMINE HCL 100 MG/ML IJ SOLN
Freq: Once | INTRAVENOUS | Status: AC
  Administered 2012-05-05: 02:00:00 via INTRAVENOUS
  Filled 2012-05-05: qty 1000

## 2012-05-05 MED ORDER — PREDNISONE 10 MG PO TABS
10.0000 mg | ORAL_TABLET | Freq: Every day | ORAL | Status: DC
  Administered 2012-05-05 – 2012-05-10 (×6): 10 mg via ORAL
  Filled 2012-05-05 (×6): qty 1

## 2012-05-05 MED ORDER — ONDANSETRON HCL 4 MG/2ML IJ SOLN: 4.0000 mg | Freq: Four times a day (QID) | INTRAMUSCULAR | Status: DC | PRN

## 2012-05-05 MED ORDER — M.V.I. ADULT IV INJ
INJECTION | INTRAVENOUS | Status: AC
  Filled 2012-05-05: qty 10

## 2012-05-05 MED ORDER — LOPERAMIDE HCL 2 MG PO CAPS
4.0000 mg | ORAL_CAPSULE | Freq: Once | ORAL | Status: AC | PRN
  Administered 2012-05-05: 4 mg via ORAL

## 2012-05-05 MED ORDER — POTASSIUM CHLORIDE 20 MEQ/15ML (10%) PO LIQD
40.0000 meq | ORAL | Status: AC
  Administered 2012-05-05 (×2): 40 meq via ORAL
  Filled 2012-05-05 (×2): qty 30

## 2012-05-05 MED ORDER — SODIUM CHLORIDE 0.9 % IV SOLN: INTRAVENOUS | Status: DC

## 2012-05-05 MED ORDER — PREDNISONE 20 MG PO TABS
20.0000 mg | ORAL_TABLET | Freq: Two times a day (BID) | ORAL | Status: DC
  Administered 2012-05-05 – 2012-05-09 (×10): 20 mg via ORAL
  Filled 2012-05-05 (×10): qty 1

## 2012-05-05 MED ORDER — ALUM & MAG HYDROXIDE-SIMETH 200-200-20 MG/5ML PO SUSP
30.0000 mL | Freq: Four times a day (QID) | ORAL | Status: DC | PRN
  Administered 2012-05-07 – 2012-05-09 (×5): 30 mL via ORAL
  Filled 2012-05-05 (×5): qty 30

## 2012-05-05 MED ORDER — AZATHIOPRINE 50 MG PO TABS
100.0000 mg | ORAL_TABLET | Freq: Every day | ORAL | Status: DC
  Administered 2012-05-05 – 2012-05-10 (×6): 100 mg via ORAL
  Filled 2012-05-05 (×7): qty 2

## 2012-05-05 MED ORDER — ADULT MULTIVITAMIN W/MINERALS CH
1.0000 | ORAL_TABLET | Freq: Every day | ORAL | Status: DC
  Administered 2012-05-05 – 2012-05-10 (×6): 1 via ORAL
  Filled 2012-05-05 (×6): qty 1

## 2012-05-05 MED ORDER — THIAMINE HCL 100 MG/ML IJ SOLN
INTRAMUSCULAR | Status: AC
  Filled 2012-05-05: qty 2

## 2012-05-05 MED ORDER — ENSURE COMPLETE PO LIQD
237.0000 mL | Freq: Two times a day (BID) | ORAL | Status: DC
  Administered 2012-05-05 – 2012-05-07 (×5): 237 mL via ORAL

## 2012-05-05 MED ORDER — FOLIC ACID 5 MG/ML IJ SOLN
INTRAMUSCULAR | Status: AC
  Filled 2012-05-05: qty 0.2

## 2012-05-05 MED ORDER — FOLIC ACID 1 MG PO TABS
1.0000 mg | ORAL_TABLET | Freq: Every day | ORAL | Status: DC
  Administered 2012-05-05 – 2012-05-10 (×6): 1 mg via ORAL
  Filled 2012-05-05 (×6): qty 1

## 2012-05-05 MED ORDER — LOPERAMIDE HCL 2 MG PO CAPS
2.0000 mg | ORAL_CAPSULE | ORAL | Status: DC | PRN
  Administered 2012-05-06 – 2012-05-08 (×2): 2 mg via ORAL
  Filled 2012-05-05: qty 1
  Filled 2012-05-05: qty 2
  Filled 2012-05-05 (×2): qty 1

## 2012-05-05 MED ORDER — FLUTICASONE PROPIONATE 50 MCG/ACT NA SUSP
2.0000 | Freq: Every day | NASAL | Status: DC
  Administered 2012-05-05 – 2012-05-10 (×6): 2 via NASAL
  Filled 2012-05-05: qty 16

## 2012-05-05 MED ORDER — ACETAMINOPHEN 325 MG PO TABS
650.0000 mg | ORAL_TABLET | Freq: Four times a day (QID) | ORAL | Status: DC | PRN
  Administered 2012-05-05: 650 mg via ORAL
  Administered 2012-05-07: 325 mg via ORAL
  Administered 2012-05-07 – 2012-05-09 (×2): 650 mg via ORAL
  Filled 2012-05-05: qty 2
  Filled 2012-05-05: qty 1
  Filled 2012-05-05: qty 2

## 2012-05-05 MED ORDER — ASPIRIN 81 MG PO CHEW
81.0000 mg | CHEWABLE_TABLET | Freq: Every day | ORAL | Status: DC
  Administered 2012-05-05 – 2012-05-10 (×6): 81 mg via ORAL
  Filled 2012-05-05 (×6): qty 1

## 2012-05-05 MED ORDER — METOPROLOL SUCCINATE ER 25 MG PO TB24
12.5000 mg | ORAL_TABLET | Freq: Every day | ORAL | Status: DC
  Administered 2012-05-05 – 2012-05-10 (×6): 12.5 mg via ORAL
  Filled 2012-05-05 (×6): qty 1

## 2012-05-05 MED ORDER — ALPRAZOLAM 0.5 MG PO TABS
0.5000 mg | ORAL_TABLET | Freq: Two times a day (BID) | ORAL | Status: DC | PRN
  Administered 2012-05-05 – 2012-05-09 (×8): 0.5 mg via ORAL
  Filled 2012-05-05 (×7): qty 1

## 2012-05-05 MED ORDER — POTASSIUM CHLORIDE IN NACL 40-0.9 MEQ/L-% IV SOLN
INTRAVENOUS | Status: DC
  Administered 2012-05-05 – 2012-05-06 (×2): via INTRAVENOUS

## 2012-05-05 NOTE — H&P (Signed)
[facility] History and Physical  [name] ZOX:[id number] DOB: 06-03-1926 DOA: 05/04/2012  Referring physician: EDP McMannus PCP: Herb Grays, MD  Specialists: [facility], [facility]  Chief Complaint: Diarrhea, generalized weakness  HPI: [name] is a 78 y.o. female with inclusion body myositis, cachexia, thyroid disease, paroxysmal A-Fib and a history of multiple falls with multiple hip fractures and dislocations who was brought into [facility] by EMS after she developed acute profuse diarrhea at home. According to patient and family members it started this morning 5-6 dark, watery stools, no BRB, no cramping or pain associated with the episodes. She has never had this before and usually has constipation. She lives with her son and is severely limited in her functional status, she has been essentially bed-bound for the past two weeks requiring total care which her family is unable to provide, they have no home care resources.  According to the patient's daughter there has been a severe progressive decline over about the past 2 years. Her diagnosis of inclusion body myositis was made in 2011 by muscle biopsy and the patient has been on Azithioprine, managed by her PCP, but the patient and family do not have an understanding of prognosis or how this disease is affecting the patient.  The patient weighed 150 lbs about 2-3 years ago according to family and now weighs 82 pounds. She has dysphagia, likely from IBM and a very poor apetite, she had an esophageal dilation for this in 2011 which did not improve her intake.  In the ED she was found to have orthostatic hypotension and profound cachexia with elevated lactic acid, so the hospitalist service was asked to admit for further evaluation and management.   Review of Systems: Review of Systems  Constitutional: Positive for weight loss and malaise/fatigue. Negative for fever and chills.  HENT: Positive for congestion.   Eyes:  Negative.   Respiratory: Positive for shortness of breath.   Cardiovascular: Negative.   Gastrointestinal: Positive for heartburn and diarrhea.  Genitourinary: Positive for urgency.  Musculoskeletal: Positive for myalgias and falls.  Skin: Negative for itching and rash.  Neurological: Positive for dizziness, tremors, weakness and headaches.  Endo/Heme/Allergies: Positive for environmental allergies. Bruises/bleeds easily.  Psychiatric/Behavioral: Positive for depression and memory loss. The patient is nervous/anxious.      Past Medical History  Diagnosis Date  . Hypothyroidism   . Atrial fibrillation   . Myositis     BODY  . LVH (left ventricular hypertrophy)     MILD  . Dizziness   . Headache   . Weakness   . Chest pain   . DOE (dyspnea on exertion)   . Poor appetite   . UTI (urinary tract infection)   . Dysphagia     History of dysphagia 2, depressed by esophagus status post dilation  . Arthritis   . Hypokalemia   . History of anxiety   . Orthostatic hypotension   . Inclusion body myositis   . Easy bruising   . Complication of anesthesia     anxiety before anesthesia, wants spinal  . Myalgia and myositis, unspecified 04/30/2011   Past Surgical History  Procedure Date  . Total hip arthroplasty 2000 or 2001    right  . Transthoracic echocardiogram 07/08/10    EF 55-60%  . Left total hip arthroplasty 1996  . Total hip revision 12/18/2011    Procedure: TOTAL HIP REVISION;  Surgeon: Loanne Drilling, MD;  Location: [facility];  Service: Orthopedics;  Laterality: Right;  Right Hip Revision to Constrained Liner-acetabular    Social History:  reports that she has never smoked. She has never used smokeless tobacco. She reports that she does not drink alcohol or use illicit drugs. Lives at her home with her son, her son will not do personal care or be able to assist her with ADLs, daughter visits frequently to help with these things, but overall they report lack of resources and  equipment to provide this care at her home. Confirmed that patient is a never smoker.   Allergies  Allergen Reactions  . Codeine Other (See Comments)    unknown  . Morphine And Related Other (See Comments)    unknown  . Piroxicam Other (See Comments)    unknown    History reviewed. No pertinent family history. No known family history of auto-immune disease or malignancy.  Prior to Admission medications   Medication Sig Start Date End Date Taking? Authorizing Provider  ALPRAZolam Prudy Feeler) 0.5 MG tablet Take 0.5 mg by mouth 2 (two) times daily as needed. anxiety   Yes Historical Provider, MD  aspirin 81 MG chewable tablet Chew 81 mg by mouth daily.   Yes Historical Provider, MD  azaTHIOprine (IMURAN) 50 MG tablet Take 100 mg by mouth daily. Takes 2 tablets As directed 04/06/11  Yes Historical Provider, MD  levothyroxine (SYNTHROID, LEVOTHROID) 75 MCG tablet Take 75 mcg by mouth daily.    Yes Historical Provider, MD  metoprolol tartrate (LOPRESSOR) 25 MG tablet Take 12.5 mg by mouth every morning. 02/10/12  Yes Cassell Clement, MD  traMADol (ULTRAM) 50 MG tablet Take 50-100 mg by mouth 3 (three) times daily as needed. pain   Yes Historical Provider, MD  albuterol (PROVENTIL HFA;VENTOLIN HFA) 108 (90 BASE) MCG/ACT inhaler Inhale 2 puffs into the lungs every 6 (six) hours as needed. Shortness of breath    Historical Provider, MD  fluticasone (FLONASE) 50 MCG/ACT nasal spray Place 2 sprays into the nose daily. 09/07/11 09/06/12  Gwyneth Sprout, MD  isosorbide dinitrate (ISORDIL) 10 MG tablet Take 10 mg by mouth 3 (three) times daily.  10/10/11   Cassell Clement, MD  meclizine (ANTIVERT) 25 MG tablet Take 25 mg by mouth 3 (three) times daily as needed. dizziness    Historical Provider, MD  rivaroxaban (XARELTO) 10 MG TABS tablet Take 1 tablet (10 mg total) by mouth daily with breakfast. 12/23/11   Amber Tamala Ser, PA   Physical Exam: Filed Vitals:   05/04/12 2210 05/04/12 2212 05/04/12 2322  05/05/12 0000  BP: 110/79 80/51 123/78 137/67  Pulse: 110 113 106 80  Temp:      TempSrc:      Resp:   18 18  Height:      Weight:      SpO2:   98% 98%     General:  Severely cachectic, disheveled and poorly groomed, pleasant in conversation, AOX3, low health literacy apparent  Eyes: non-icteric  ENT: poor dentition, dry mucous mebranes  Neck: supple  Cardiovascular: RRR, no mrg  Respiratory: CTAB  Abdomen: scaphoid, no masses, no guarding or distension  Skin: no bruising or rashes  Musculoskeletal: severe muscle wasting and atrophy, symetrical weakness to gravity, intention tremor to resistance  Psychiatric: slightly depressed and anxious appearing  Neurologic: generalized weakness that is non-focal no sensory changes.  Labs on Admission:  Basic Metabolic Panel:  Lab 05/05/12 4098 05/04/12 2145  NA -- 132*  K -- 4.0  CL -- 96  CO2 -- 24  GLUCOSE --  102*  BUN -- 26*  CREATININE -- 0.87  CALCIUM -- 9.9  MG 2.0 --  PHOS 2.6 --   Liver Function Tests:  Lab 05/04/12 2145  AST 23  ALT 10  ALKPHOS 88  BILITOT 0.5  PROT 7.4  ALBUMIN 3.5    Lab 05/04/12 2145  LIPASE 38  AMYLASE --   No results found for this basename: AMMONIA:5 in the last 168 hours CBC:  Lab 05/04/12 2145  WBC 5.3  NEUTROABS 4.5  HGB 12.2  HCT 35.7*  MCV 106.9*  PLT 269   Cardiac Enzymes:  Lab 05/05/12 0030 05/04/12 2145  CKTOTAL 54 --  CKMB -- --  CKMBINDEX -- --  TROPONINI -- <0.30      Radiological Exams on Admission: Dg Abd Acute W/chest  05/04/2012  *RADIOLOGY REPORT*  Clinical Data: Failure to thrive.  ACUTE ABDOMEN SERIES (ABDOMEN 2 VIEW & CHEST 1 VIEW)  Comparison: Plain film of the chest 11/02/2010.  Findings: Single view of the chest demonstrates pulmonary hyperexpansion and attenuation of the pulmonary vasculature suggesting emphysema.  The lungs are clear.  Heart size is normal.  Two views of the abdomen show no free intraperitoneal air.  The bowel gas  pattern is unremarkable.  Bilateral hip replacements and convex left scoliosis are noted.  IMPRESSION: No acute finding chest or abdomen.   Original Report Authenticated By: Bernadene Bell. D'ALESSIO, M.D.     EKG: Independently reviewed. NSR, initial EKG with artifact.  Assessment/Plan Active Problems:  PAF (paroxysmal atrial fibrillation)  Hypothyroidism  Weight loss, unintentional  Muscle weakness (generalized)  Malnutrition/Cachexia  Failure to thrive  Falls  Diarrhea  Orthostatic hypotension  Macrocytosis  Dehydration  Lactic acidosis  History of hip fracture  Depression with anxiety  Inclusion body myositis  Allergic rhinitis  Dysphagia   1. New onset Diarrhea with orthostatic hypotension, dehydration and malnutrition. Broad differential including Colitis, LGIB, Infectious, Inflammatory, or related to her chronic diseases and medications.She is on Azathioprine, so immune suppressed and may have baseline cyopenias, at high risk for occult infections.  Will initiate work-up for infectious causes of diarrhea: C. Diff, Stool Cx  Will check Mg, Phos, TSH  UA is pending, will have to obtain I/O cath to get appropriate sample  CXR shows nothing acute.  No abdominal pain. LFTs are in normal range, BMET does not show severe electrolyte disturbance but onset was this morning.  IV fluid hydration with folic acid and thiamine and MVI.  2. Cachexia/Failure to Thrive/Malnutrition, quite severe, she weighs 81 lbs and her premorbid weight was 150. Etiology is probably multifactorial, but underlying terminal disease is her inclusion body myositis which has led to dysophagia, atrophy, inflammatory hypercatabolism and severe immobility/decline.  Started on BID prednisone as an appetite stimulant and also for treatment of IBM with continued Azithiprine and an adjuvant.  Nutritional consult  SLP consult  Ensure between meals  Comfort feeding, will not limited diet in anyway  Is not a  candidate for even short term parenteral or artifical feeding tube, her condition and reason for her malnutrition is probably terminal, and this should not be offered as a sound medical choice or option. Artifical feeding would lead to more immobility and discomfort.  3. Inclusion Body Myositis, likely the primary disease process that has led to her progressive decline, falls from weakness and loss of function, this is a terminal disease in the elderly and this patient may actually be appropriate for hospice care, loss of respiratory muscle drive can  also occur. I have not discussed Hospice as an option on admission but if she does not improve it may be a good choice for her and help her be able to stay at home which is what she wants/she is refusing SNF/may also be great candidate for PACE program of [facility].  Started prednisone, worth a try to improve her appetite and help with slowing disease progression.  Continue Azithioprine  Will check a CRP and CK, although we already have tissue diagnosis and condition is not curable  4. Hypothyroidism  Will check TSH and T4  5. Paroxysmal A-Fib, on Rivaroxiban for anticoagulation.   I have stopped this on admission and probably needs to be stopped indefinitely, she is NSR on EKG and has no history of DVT, her risk for falls is VERY high and I think the risks of rivaroxiban far outweigh the benefits- I am not sure who started this or for how long she has been on the medication, it may have been continued after her hip surgery for DVT prophylaxis as well.  I changed he Metoprolol to Toprol which is XR instead of once daily Metoprolol.  Stopped Isosorbide, was probably potentiating orthostasis and hypotension, will monitor her pressure.  6. Macrocytosis, etiology unknown suspect from azithioprine or nutritional  Will check B12 and Folic acid level and anemia panel, she wil probably be anemic by labs in AM once we hydrate her.  Code Status:  Limited Code, discussed this with patient and her daughter at bedside, patient simply would not survive CPR or intubation given her very frail status. Both are in agreement, Full scope medical care  Desired and treatment for all reversible conditions. Family Communication: Plan discussed in detail with patient and her daughter in detail Disposition Plan: Patient does not want to go to SNF-if she goes home at discharge family will need lots of help, I also worry about her safety and fall risk, will need 24/7 in home care most likely and DME. May consider PACE, Hospice, or Home Health as options. Anticipate LOS to be 2-3 days.  Time spent: 70+ minutes  [facility] Pager [phone]  If 7PM-7AM, please contact night-coverage [email or web address] Password [email or web address] 05/05/2012, 1:08 AM

## 2012-05-05 NOTE — Clinical Social Work Psychosocial (Signed)
Clinical Social Work Department BRIEF PSYCHOSOCIAL ASSESSMENT 05/05/2012  Patient:  [name], [name]     Account Number:  [id number]     Admit date:  05/04/2012  Clinical Social Worker:  Sherrlyn Hock  Date/Time:  05/05/2012 03:15 PM  Referred by:  Physician  Date Referred:  05/05/2012 Referred for  Psychosocial assessment   Other Referral:   Interview type:  Patient Other interview type:    PSYCHOSOCIAL DATA Living Status:  FAMILY Admitted from facility:   Level of care:   Primary support name:  Onalee Hua Primary support relationship to patient:  CHILD, ADULT Degree of support available:   pt reports son is very supportive    CURRENT CONCERNS Current Concerns  Other - See comment   Other Concerns:   home situation  APS report    SOCIAL WORK ASSESSMENT / PLAN CSW met with pt at bedside following referral from MD and progression. Pt alert and oriented x4. Pt brought to ED from home and had feces dried to her backside and legs. Pt does not remember being in shower which is where she was found. She states her son Onalee Hua lives with her and is there around the clock. She describes his care as very good. By pt report, he cleans and cooks and takes her to any doctor's appointments. APS report was made in ED. EMS report indicated pt's home was very dirty and also described condition pt was found in. When asked about home environment, pt said she was "disgusted" that EMS said her house was not clean as she feels it is fine.    Pt said she uses a walker at home but spends most of the day in a chair by her choice. PT evaluation found pt to be very deconditioned but not a good rehab candidate. Pt states she is just "worn out" and can usually do more than she did today with PT. PT recommending home health on trial basis with 24/7 supervision. CSW met with Juliette Alcide, DSS caseworker who reports pt has capacity and says she is returning home. CSW also asked pt about d/c plans and she states she  wants to go back home. Juliette Alcide said okay to follow pt's wishes and they will follow up at home. CSW asked about potential CAP aid and Juliette Alcide will initiate referral. Pt is agreeable to this if qualifies.   Assessment/plan status:  Psychosocial Support/Ongoing Assessment of Needs Other assessment/ plan:   Information/referral to community resources:   APS  CM for home health/CAP    PATIENT'S/FAMILY'S RESPONSE TO PLAN OF CARE: Pt feels she manages just fine at home with her son and plans to return there at d/c. CM is aware of home health needs and DSS will continue to be involved and make referrals as necessary. CSW signing off but can be reconsulted if needed.      Derenda Fennel, [place] [phone]

## 2012-05-05 NOTE — Progress Notes (Signed)
INITIAL ADULT NUTRITION ASSESSMENT Date: 05/05/2012   Time: 3:02 PM Reason for Assessment: Malnutrition Screen  ASSESSMENT: Female 78 y.o.  XB:JYNWGNFAOZHY/QMVHQION, FTT, Unintentional wt loss   Past Medical History  Diagnosis Date  . Hypothyroidism   . Atrial fibrillation   . Myositis     BODY  . LVH (left ventricular hypertrophy)     MILD  . Dizziness   . Headache   . Weakness   . Chest pain   . DOE (dyspnea on exertion)   . Poor appetite   . UTI (urinary tract infection)   . Dysphagia     History of dysphagia 2, depressed by esophagus status post dilation  . Arthritis   . Hypokalemia   . History of anxiety   . Orthostatic hypotension   . Inclusion body myositis   . Easy bruising   . Complication of anesthesia     anxiety before anesthesia, wants spinal  . Myalgia and myositis, unspecified 04/30/2011    Scheduled Meds:   . aspirin  81 mg Oral Daily  . azaTHIOprine  100 mg Oral Daily  . fluticasone  2 spray Each Nare Daily  . folic acid  1 mg Oral Daily  . metoprolol succinate  12.5 mg Oral Daily  . multivitamin with minerals  1 tablet Oral Daily  . potassium chloride  40 mEq Oral Q3H  . predniSONE  10 mg Oral Q breakfast  . predniSONE  20 mg Oral BID WC  . general admission iv infusion   Intravenous Once  . sodium chloride  250 mL Intravenous Once   Continuous Infusions:   . 0.9 % NaCl with KCl 40 mEq / L 75 mL/hr at 05/05/12 1343  . DISCONTD: sodium chloride 75 mL/hr at 05/04/12 2145  . DISCONTD: sodium chloride    . DISCONTD: sodium chloride 0.9 % 1,000 mL with potassium chloride 40 mEq infusion     PRN Meds:.acetaminophen, acetaminophen, albuterol, ALPRAZolam, alum & mag hydroxide-simeth, ondansetron (ZOFRAN) IV, ondansetron  Ht: 5\' 5"  (165.1 cm)  Wt: 97 lb (44 kg)  Ideal Wt: 57 kg  % Ideal Wt: 78%  Usual Wt: 150# (2-3 yrs ago) % Usual Wt:65%   Body mass index is 16.14 kg/(m^2). Underweight  Food/Nutrition Related Hx: Pt lives with son.  She has hx of falls and hip fx.Presented to ED after multiple episodes of diarrhea, and also dehydrated. Daughter reports severe unintentional wt loss and decline x 2 years and PT eval results; she was found to be severely deconditioned with significant muscle wasting. Hx of poor appetite and Dysphagia noted. SLP consult pending. Comfort feeding. Pt is not appropriate for EN or PN nutrition and prednisone added for appetite per MD.    Pt meets criteria for severe malnutrition in the context of chronic illness as evidenced by </=75% energy intake >/= 1 month and orbital fat loss (hollow look) and significant muscle wasting, and severe unintentional wt loss.      CMP     Component Value Date/Time   NA 135 05/05/2012 0504   K 3.0* 05/05/2012 0504   CL 104 05/05/2012 0504   CO2 22 05/05/2012 0504   GLUCOSE 121* 05/05/2012 0504   BUN 27* 05/05/2012 0504   CREATININE 0.76 05/05/2012 0504   CALCIUM 8.5 05/05/2012 0504   PROT 7.4 05/04/2012 2145   ALBUMIN 3.5 05/04/2012 2145   AST 23 05/04/2012 2145   ALT 10 05/04/2012 2145   ALKPHOS 88 05/04/2012 2145   BILITOT 0.5 05/04/2012 2145  GFRNONAA 75* 05/05/2012 0504   GFRAA 87* 05/05/2012 0504     Intake/Output Summary (Last 24 hours) at 05/05/12 1525 Last data filed at 05/05/12 1013  Gross per 24 hour  Intake      0 ml  Output      2 ml  Net     -2 ml    Diet Order: Dysphagia 3 / thin liquids  Supplements:MVI, Folic Acid  IVF:    0.9 % NaCl with KCl 40 mEq / L Last Rate: 75 mL/hr at 05/05/12 1343  DISCONTD: sodium chloride Last Rate: 75 mL/hr at 05/04/12 2145  DISCONTD: sodium chloride   DISCONTD: sodium chloride 0.9 % 1,000 mL with potassium chloride 40 mEq infusion     Estimated Nutritional Needs:   Kcal:1320-1540 kcal Protein:62-75 gr Fluid:1 ml/kcal  NUTRITION DIAGNOSIS: -Inadequate oral intake (NI-2.1).  Status: Ongoing  RELATED TO: poor appetite  AS EVIDENCE BY: FTT, hx of poor oral intake and severe unintentional  wt loss >50#, 35%  MONITORING/EVALUATION(Goals): Monitor for plan of care (disposition), SLP eval   Goal: Pt to meet >/= 90% of their estimated nutrition needs; not met  EDUCATION NEEDS: -No education needs identified at this time  INTERVENTION: Ensure Complete po BID, each supplement provides 350 kcal and 13 grams of protein.   Dietitian [phone]  DOCUMENTATION CODES Per approved criteria  -Severe malnutrition in the context of chronic illness    Francene Boyers 05/05/2012, 3:02 PM

## 2012-05-05 NOTE — Progress Notes (Signed)
Patient admitted by Dr. Phillips Odor earlier this morning  Patient seen and examined, database reviewed.  She is admitted for diarrhea, dehydration. She does have a history of inclusion body myositis. It does not appear that the patient or her son understand the prognosis of her disease. I did briefly discuss this with them.  It does not appear that she is being followed by a rheumatologist.  Infectious work up for her diarrhea is underway.  She describes her stools as dark in color.  Her hemoglobin appears to be near baseline.  Will check stool occult blood.   Dehydration will be corrected with IV fluids and potassium will be repleted.

## 2012-05-05 NOTE — Progress Notes (Signed)
UR Chart Review Completed  

## 2012-05-05 NOTE — Care Management Note (Unsigned)
    Page 1 of 2   05/08/2012     1:13:17 PM   CARE MANAGEMENT NOTE 05/08/2012  Patient:  [name], [name]   Account Number:  [id number]  Date Initiated:  05/05/2012  Documentation initiated by:  Rosemary Holms  Subjective/Objective Assessment:   Pt lives at home with her son. Found in her shower after having loose stools. Son states she passed out. Son states she has been in a NH after surgery but does not recall HH.     Action/Plan:   Pt wants to return home with [facility]. Previously used [employer].   Anticipated DC Date:  05/08/2012   Anticipated DC Plan:  HOME W HOME HEALTH SERVICES  In-house referral  Clinical Social Worker      DC Planning Services  CM consult      Choice offered to / List presented to:          [facility] arranged  HH-1 RN  HH-2 PT  HH-4 NURSE'S AIDE  HH-6 SOCIAL WORKER      HH agency  [facility]   Status of service:  In process, will continue to follow Medicare Important Message given?  YES (If response is "NO", the following Medicare IM given date fields will be blank) Date Medicare IM given:  05/06/2012 Date Additional Medicare IM given:  05/08/2012  Discharge Disposition:    Per UR Regulation:    If discussed at Long Length of Stay Meetings, dates discussed:    Comments:  05/08/12 1311 Arlyss Queen, RN BSN CM Pt undergoing 24 hour stool collection. Pt is now for placement. Has a bed at [name]. CSW will arrange discharge to facility when medically stable.  05/07/12 Amy Robson RN BSN CM Son remembers that pt had [facility] in the past and would like to use them if [facility] ordered.  05/05/12 1115 Amy Leanord Hawking RN BSN CM

## 2012-05-05 NOTE — Evaluation (Signed)
Occupational Therapy Evaluation Patient Details Name: KORTNEY POTVIN MRN: [id number] DOB: [birth year] Today's Date: 05/05/2012 Time: 8469-6295 OT Time Calculation (min): 30 min  OT Assessment / Plan / Recommendation Clinical Impression  Patient is a 78 y/o female s/p Malnutrition presenting to acute OT with decreased strength and impaired ADL performance. Patient initially did not want to participate in OT and PT eval, but did with max encouragement. Because she is so deconditioned at this point and with her chronic progressive underlying disease, she would appear to have poor rehab potential.  She will need 24 hour support at home if living conditions are adequate or patient will need a long term setting.    OT Assessment  Patient does not need any further OT services    Follow Up Recommendations  Supervision/Assistance - 24 hour       Equipment Recommendations  3 in 1 bedside comode;Wheelchair (measurements)          Precautions / Restrictions Precautions Precautions: Fall Restrictions Weight Bearing Restrictions: No   Pertinent Vitals/Pain No report of pain.    ADL  Lower Body Dressing: Performed;+1 Total assistance Where Assessed - Lower Body Dressing: Supine, head of bed flat;Rolling right and/or left Transfers/Ambulation Related to ADLs: Transfer not attempted. Pt unable to perform this date. Pt stated that she was too weak.      Visit Information  Last OT Received On: 05/05/12 Assistance Needed: +1    Subjective Data  Subjective: "I don't think I can do anything today." Patient Stated Goal: To go home.   Prior Functioning     Home Living Lives With: Son Available Help at Discharge: Family;Available PRN/intermittently Type of Home: House Home Access: Stairs to enter [employer] of Steps: 4-5 Entrance Stairs-Rails:  (1 rail centered on staircase) Home Layout: One level Bathroom Shower/Tub: Engineer, manufacturing systems: Standard Home  Adaptive Equipment: Walker - rolling;Shower chair with back;Grab bars in shower Prior Function Level of Independence: Needs assistance Needs Assistance: Bathing;Dressing;Light Housekeeping;Meal Prep Able to Take Stairs?: Yes Driving: No Vocation: Retired Comments: Pt has been essentially bed bound for the past 2 weeks...prior to that, she was ambulatory with a walker Communication Communication: HOH Dominant Hand: Right         Vision/Perception   Cognition  Overall Cognitive Status: Appears within functional limits for tasks assessed/performed Arousal/Alertness: Awake/alert Orientation Level: Appears intact for tasks assessed Behavior During Session: [facility] for tasks performed    Extremity/Trunk Assessment Right Upper Extremity Assessment RUE ROM/Strength/Tone: Deficits RUE ROM/Strength/Tone Deficits: WFL A/ROM for shoulder, elbow, and wrist ranges; MMT: shoulder flexion/horizontal adduction and abduction/elbow flexion/extension: 3+/5  RUE Coordination: Deficits RUE Coordination Deficits: Absent fine and gross motor coordination. Left Upper Extremity Assessment LUE ROM/Strength/Tone: Deficits LUE ROM/Strength/Tone Deficits: WFL A/ROM for shoulder, elbow, and wrist ranges; MMT: shoulder flexion/horizontal adduction and abduction: 3+/5. Elbow flexion/extension: 3/5 LUE Coordination: Deficits LUE Coordination Deficits: Absent fine and gross motor coordination. Right Lower Extremity Assessment RLE ROM/Strength/Tone: Deficits RLE ROM/Strength/Tone Deficits: general strength =3/5 at hip, 4-/5 at knee RLE Sensation: WFL - Light Touch Left Lower Extremity Assessment LLE ROM/Strength/Tone: Deficits LLE ROM/Strength/Tone Deficits: strength generally 3/5 at hip, 4-/5 at knee LLE Sensation: WFL - Light Touch Trunk Assessment Trunk Assessment: Kyphotic     Mobility Bed Mobility Bed Mobility: Rolling Right;Rolling Left;Left Sidelying to Sit;Sit to Sidelying Left;Scooting to  [facility] Rolling Right: 6: Modified independent (Device/Increase time);With rail Rolling Left: 6: Modified independent (Device/Increase time);With rail Supine to Sit: 3: Mod assist;HOB flat Sit to Sidelying  Left: 5: Supervision Scooting to [facility]: 5: Supervision Details for Bed Mobility Assistance: pt is able to swing LEs over EOB in supine to sit, but doesn't have the strength to push herself up to sitting without mod assist Transfers Sit to Stand: 5: Supervision;Without upper extremity assist;From bed Stand to Sit: 5: Supervision;Without upper extremity assist;To bed           Balance Balance Balance Assessed: Yes Static Sitting Balance Static Sitting - Balance Support: No upper extremity supported;Feet supported Static Sitting - Level of Assistance: 7: Independent Dynamic Sitting Balance Dynamic Sitting - Balance Support: No upper extremity supported;Feet supported Dynamic Sitting - Level of Assistance: 5: Stand by assistance   End of Session OT - End of Session Equipment Utilized During Treatment: Gait belt;Other (comment) (rolling walker) Activity Tolerance: Patient limited by fatigue Patient left: in bed;with nursing in room;with family/visitor present    Limmie Patricia, OTR/L 05/05/2012, 2:17 PM

## 2012-05-05 NOTE — Evaluation (Signed)
Physical Therapy Evaluation Patient Details Name: [name] MRN: [id number] DOB: May 02, 1926 Today's Date: 05/05/2012 Time: 3086-5784 PT Time Calculation (min): 30 min  PT Assessment / Plan / Recommendation Clinical Impression  Pt was seen for eval and found to be severely deconditioned with significant muscle wasting.  She had to be coaxed to work with [place]...c/o being very sleepy.  She is independent with bed mobility but needs mod assist to transfer to sitting.  She was able to stand at EOB with walker, but too fatigued to try to take any steps.  Because she is so deconditioned at this point and with her chronic progressive underlying disease, she would appear to have poor rehab potential.  She will need adequate 24 hour support at home with a trial of HHPT to try to maximize function or a long term setting.            PT Assessment  Patient needs continued PT services    Follow Up Recommendations  Home health PT    Does the patient have the potential to tolerate intense rehabilitation      Barriers to Discharge Decreased caregiver support      Equipment Recommendations  3 in 1 bedside comode;Wheelchair (measurements)    Recommendations for Other Services     Frequency Min 3X/week    Precautions / Restrictions Precautions Precautions: Fall Restrictions Weight Bearing Restrictions: No   Pertinent Vitals/Pain       Mobility  Bed Mobility Bed Mobility: Rolling Right;Rolling Left;Left Sidelying to Sit;Sit to Sidelying Left;Scooting to [facility] Rolling Right: 6: Modified independent (Device/Increase time);With rail Rolling Left: 6: Modified independent (Device/Increase time);With rail Supine to Sit: 3: Mod assist;HOB flat Sit to Sidelying Left: 5: Supervision Scooting to [facility]: 5: Supervision Details for Bed Mobility Assistance: pt is able to swing LEs over EOB in supine to sit, but doesn't have the strength to push herself up to sitting without mod  assist Transfers Transfers: Sit to Stand;Stand to Sit Sit to Stand: 5: Supervision;Without upper extremity assist;From bed Stand to Sit: 5: Supervision;Without upper extremity assist;To bed Ambulation/Gait Ambulation/Gait Assistance: Not tested (comment) (too fatigued--stood at EOB only about 20 sec.) Assistive device: Rolling walker    Shoulder Instructions     Exercises     PT Diagnosis: Difficulty walking;Generalized weakness  PT Problem List: Decreased strength;Decreased activity tolerance;Decreased mobility PT Treatment Interventions: Gait training;Functional mobility training;Therapeutic exercise;Patient/family education   PT Goals Acute Rehab PT Goals PT Goal Formulation: With patient Time For Goal Achievement: 05/12/12 Potential to Achieve Goals: Fair Pt will go Supine/Side to Sit: with min assist;with HOB 0 degrees PT Goal: Supine/Side to Sit - Progress: Goal set today Pt will Ambulate: 1 - 15 feet;with min assist;with rolling walker PT Goal: Ambulate - Progress: Goal set today  Visit Information  Last PT Received On: 05/05/12 Assistance Needed: +1 PT/OT Co-Evaluation/Treatment: Yes    Subjective Data  Subjective: I'm so sleepy Patient Stated Goal: none stated   Prior Functioning  Home Living Lives With: Son Available Help at Discharge: Family;Available PRN/intermittently Type of Home: House Home Access: Stairs to enter [employer] of Steps: 4-5 Entrance Stairs-Rails:  (1 rail centered on staircase) Home Layout: One level Bathroom Shower/Tub: Engineer, manufacturing systems: Standard Home Adaptive Equipment: Walker - rolling;Shower chair with back;Grab bars in shower Prior Function Level of Independence: Needs assistance Needs Assistance: Bathing;Dressing;Light Housekeeping;Meal Prep Able to Take Stairs?: Yes Driving: No Vocation: Retired Comments: Pt has been essentially bed bound for the past 2  weeks...prior to that, she was ambulatory with  a walker Communication Communication: HOH Dominant Hand: Right    Cognition  Overall Cognitive Status: Appears within functional limits for tasks assessed/performed Arousal/Alertness: Awake/alert Orientation Level: Appears intact for tasks assessed Behavior During Session: [facility] for tasks performed    Extremity/Trunk Assessment Right Lower Extremity Assessment RLE ROM/Strength/Tone: Deficits RLE ROM/Strength/Tone Deficits: general strength =3/5 at hip, 4-/5 at knee RLE Sensation: WFL - Light Touch Left Lower Extremity Assessment LLE ROM/Strength/Tone: Deficits LLE ROM/Strength/Tone Deficits: strength generally 3/5 at hip, 4-/5 at knee LLE Sensation: [facility] - Light Touch Trunk Assessment Trunk Assessment: Kyphotic   Balance Balance Balance Assessed: Yes Static Sitting Balance Static Sitting - Balance Support: No upper extremity supported;Feet supported Static Sitting - Level of Assistance: 7: Independent Dynamic Sitting Balance Dynamic Sitting - Balance Support: No upper extremity supported;Feet supported Dynamic Sitting - Level of Assistance: 5: Stand by assistance  End of Session PT - End of Session Equipment Utilized During Treatment: Gait belt Activity Tolerance: Patient limited by fatigue Patient left: in bed;with call bell/phone within reach;with bed alarm set;with family/visitor present Nurse Communication: Mobility status  GP     Myrlene Broker L 05/05/2012, 2:06 PM

## 2012-05-05 NOTE — Evaluation (Signed)
Clinical/Bedside Swallow Evaluation Patient Details  Name: ANAYELY CHEESEBORO MRN: [id number] Date of Birth: May 28, 1926  Today's Date: 05/05/2012 Time: 1308-6578 SLP Time Calculation (min): 29 min  Past Medical History:  Past Medical History  Diagnosis Date  . Hypothyroidism   . Atrial fibrillation   . Myositis     BODY  . LVH (left ventricular hypertrophy)     MILD  . Dizziness   . Headache   . Weakness   . Chest pain   . DOE (dyspnea on exertion)   . Poor appetite   . UTI (urinary tract infection)   . Dysphagia     History of dysphagia 2, depressed by esophagus status post dilation  . Arthritis   . Hypokalemia   . History of anxiety   . Orthostatic hypotension   . Inclusion body myositis   . Easy bruising   . Complication of anesthesia     anxiety before anesthesia, wants spinal  . Myalgia and myositis, unspecified 04/30/2011   Past Surgical History:  Past Surgical History  Procedure Date  . Total hip arthroplasty 2000 or 2001    right  . Transthoracic echocardiogram 07/08/10    EF 55-60%  . Left total hip arthroplasty 1996  . Total hip revision 12/18/2011    Procedure: TOTAL HIP REVISION;  Surgeon: Loanne Drilling, MD;  Location: [facility];  Service: Orthopedics;  Laterality: Right;  Right Hip Revision to Constrained Liner-acetabular    HPI:  Ms. Shama Stophel is an 78 yo woman with a diagnosis of inclusion body myositis who was admitted with dehydration and diarrhea. She lives at home with her son. Pt is very thin and reports diminished appetite. Swallow evaluation ordered for diet recommendation and education for aspiration precautions.   Assessment / Plan / Recommendation Clinical Impression  Pt appears malnourished and reports poor appetite in setting of inclusion body myositis. Pt also describes globus sensation and points to cervical esophagus region. She shows no overt signs or symptoms of aspiration with consistencies presented except for slight throat clearing  after given crackers. Pt cued to alternate solids and liquids to help clear bolus. Pt reports that she has had esophageal dilation in the past, but cannot remember how long ago. Esophageal dysphagia is common in patients with [employer] and may be relieved by esophageal dilation. Consider GI referral for dilation if indicated. Education provided to pt and she says that she may want to pursue GI referral after she recovers from this acute admission. SLP will follow for diet tolerance and ongoing education as needed on Thursday.    Aspiration Risk  Mild    Diet Recommendation Dysphagia 3 (Mechanical Soft);Thin liquid   Liquid Administration via: Cup;Straw Medication Administration: Whole meds with liquid Supervision: Intermittent supervision to cue for compensatory strategies Compensations: Slow rate;Small sips/bites;Follow solids with liquid;Multiple dry swallows after each bite/sip Postural Changes and/or Swallow Maneuvers: Seated upright 90 degrees;Upright 30-60 min after meal    Other  Recommendations Recommended Consults: Consider GI evaluation Oral Care Recommendations: Oral care BID Other Recommendations: Clarify dietary restrictions   Follow Up Recommendations  None    Frequency and Duration min 1 x/week  1 week   Pertinent Vitals/Pain N/A    SLP Swallow Goals Patient will consume recommended diet without observed clinical signs of aspiration with: Supervision/safety Patient will utilize recommended strategies during swallow to increase swallowing safety with: Supervision/safety   Swallow Study Prior Functional Status   Poor appetite, lives at home with her son.  General Date of Onset: 05/05/12 HPI: Ms. Miyako Barrionuevo is an 78 yo woman with a diagnosis of inclusion body myositis who was admitted with dehydration and diarrhea. She lives at home with her son. Pt is very thin and reports diminished appetite. Swallow evaluation ordered for diet recommendation and education for  aspiration precautions. Type of Study: Bedside swallow evaluation Previous Swallow Assessment: None on record Diet Prior to this Study: Dysphagia 3 (soft);Thin liquids Temperature Spikes Noted: No Respiratory Status: Room air History of Recent Intubation: No Behavior/Cognition: Alert;Cooperative;Pleasant mood Oral Cavity - Dentition: Poor condition Self-Feeding Abilities: Needs assist (feels weak currently) Patient Positioning: Upright in bed Baseline Vocal Quality: Clear Volitional Cough: Strong Volitional Swallow: Able to elicit    Oral/Motor/Sensory Function Overall Oral Motor/Sensory Function: Appears within functional limits for tasks assessed Labial ROM: Within Functional Limits Labial Symmetry: Within Functional Limits Labial Strength: Within Functional Limits Labial Sensation: Within Functional Limits Lingual ROM: Within Functional Limits Lingual Symmetry: Within Functional Limits Lingual Strength: Within Functional Limits Lingual Sensation: Within Functional Limits Facial ROM: Within Functional Limits Facial Symmetry: Within Functional Limits Facial Strength: Within Functional Limits Facial Sensation: Within Functional Limits Velum: Within Functional Limits Mandible: Within Functional Limits   Ice Chips Ice chips: Within functional limits Presentation: Spoon   Thin Liquid Thin Liquid: Within functional limits Presentation: Cup;Straw    Nectar Thick Nectar Thick Liquid: Not tested   Honey Thick Honey Thick Liquid: Not tested   Puree Puree: Within functional limits   Solid   Thank you,  Havery Moros, CCC-SLP [phone]     Solid: Impaired Pharyngeal Phase Impairments: Throat Clearing - Delayed (feeling of stasis)       Mahek Schlesinger 05/05/2012,8:29 PM

## 2012-05-06 DIAGNOSIS — K922 Gastrointestinal hemorrhage, unspecified: Secondary | ICD-10-CM

## 2012-05-06 DIAGNOSIS — E860 Dehydration: Secondary | ICD-10-CM

## 2012-05-06 DIAGNOSIS — R197 Diarrhea, unspecified: Secondary | ICD-10-CM

## 2012-05-06 DIAGNOSIS — R1013 Epigastric pain: Secondary | ICD-10-CM

## 2012-05-06 DIAGNOSIS — IMO0001 Reserved for inherently not codable concepts without codable children: Secondary | ICD-10-CM

## 2012-05-06 LAB — BASIC METABOLIC PANEL
GFR calc Af Amer: 88 mL/min — ABNORMAL LOW (ref >90)
GFR calc non Af Amer: 76 mL/min — ABNORMAL LOW (ref >90)
Glucose, Bld: 97 mg/dL (ref 70–99)

## 2012-05-06 LAB — CBC
HCT: 28 % — ABNORMAL LOW (ref 36.0–46.0)
MCH: 36.4 pg — ABNORMAL HIGH (ref 26.0–34.0)
MCV: 108.5 fL — ABNORMAL HIGH (ref 78.0–100.0)
Platelets: 196 10*3/uL (ref 150–400)
RBC: 2.58 MIL/uL — ABNORMAL LOW (ref 3.87–5.11)
WBC: 5.2 10*3/uL (ref 4.0–10.5)

## 2012-05-06 MED ORDER — LEVOTHYROXINE SODIUM 100 MCG PO TABS
100.0000 ug | ORAL_TABLET | Freq: Every day | ORAL | Status: DC
  Administered 2012-05-07 – 2012-05-10 (×4): 100 ug via ORAL
  Filled 2012-05-06 (×4): qty 1

## 2012-05-06 MED ORDER — SODIUM CHLORIDE 0.45 % IV SOLN
INTRAVENOUS | Status: DC
  Administered 2012-05-06 (×2): via INTRAVENOUS

## 2012-05-06 MED ORDER — SODIUM CHLORIDE 0.9 % IJ SOLN
INTRAMUSCULAR | Status: AC
  Filled 2012-05-06: qty 3

## 2012-05-06 MED ORDER — METRONIDAZOLE IN NACL 5-0.79 MG/ML-% IV SOLN
INTRAVENOUS | Status: AC
  Filled 2012-05-06: qty 100

## 2012-05-06 MED ORDER — METRONIDAZOLE IN NACL 5-0.79 MG/ML-% IV SOLN
500.0000 mg | Freq: Three times a day (TID) | INTRAVENOUS | Status: DC
  Administered 2012-05-06: 500 mg via INTRAVENOUS
  Filled 2012-05-06 (×5): qty 100

## 2012-05-06 NOTE — Progress Notes (Signed)
Physical Therapy Treatment Patient Details Name: [name] MRN: [id number] DOB: 10-14-1925 Today's Date: 05/06/2012 Time: 1478-2956 PT Time Calculation (min): 24 min  PT Assessment / Plan / Recommendation Comments on Treatment Session  Pt continues to require significant cueing for motivation.  Pt obviously depressed secondary to family up and leaving when she was resting.  She was able to complete most activitiy at S-min A level.  place pt back to bed per pt request and pt was unmotivated to continue to move today.     Follow Up Recommendations        Does the patient have the potential to tolerate intense rehabilitation     Barriers to Discharge        Equipment Recommendations       Recommendations for Other Services    Frequency     Plan Discharge plan remains appropriate    Precautions / Restrictions     Pertinent Vitals/Pain 0/10    Mobility  Bed Mobility Bed Mobility: Rolling Right;Right Sidelying to Sit;Rolling Left;Sit to Sidelying Right Rolling Right: 5: Supervision Rolling Left: 4: Min assist Right Sidelying to Sit: 4: Min assist Sit to Sidelying Left: 5: Supervision Scooting to [facility]: 5: Supervision Transfers Transfers: Sit to Stand;Stand to Sit Sit to Stand: 5: Supervision;With upper extremity assist;From bed Stand to Sit: 5: Supervision;With upper extremity assist;To bed Details for Transfer Assistance: cueing for proper technique w/RW.  3x for Activity Ambulation/Gait Ambulation/Gait Assistance: Not tested (comment)    Exercises     PT Diagnosis:    PT Problem List:   PT Treatment Interventions:     PT Goals Acute Rehab PT Goals PT Goal Formulation: With patient Time For Goal Achievement: 05/12/12 Potential to Achieve Goals: Fair Pt will go Supine/Side to Sit: with min assist;with HOB 0 degrees PT Goal: Supine/Side to Sit - Progress: Met Pt will Ambulate: 1 - 15 feet;with min assist;with rolling walker PT Goal: Ambulate - Progress:  Progressing toward goal  Visit Information  Last PT Received On: 05/06/12 Reason Eval/Treat Not Completed: Fatigue/lethargy limiting ability to participate    Subjective Data  Subjective: "I can't believe my family just left me."   Cognition       Balance     End of Session PT - End of Session Equipment Utilized During Treatment: Gait belt Activity Tolerance: Patient limited by fatigue   GP     Lori-Ann Lindfors 05/06/2012, 4:36 PM

## 2012-05-06 NOTE — Progress Notes (Signed)
Patient was still having a lot of watery diarrhea. Patient's bottom was getting red from wiping so many times. Called doctor to ask if we could get a rectal tube order. Doctor put in new orders for one.

## 2012-05-06 NOTE — Progress Notes (Signed)
[facility]             Progress Note   Subjective: Continued diarrhea and abdominal pain.  Patient needed to have rectal tube placed.  She feels weak.  C diff was found to be negative. No vomiting.  Appetite is poor.  Objective: Vital signs in last 24 hours: Temp:  [97.7 F (36.5 C)-98.3 F (36.8 C)] 97.7 F (36.5 C) (10/30 0637) Pulse Rate:  [65-87] 65  (10/30 0637) Resp:  [18-20] 20  (10/30 0637) BP: (109-128)/(52-74) 127/52 mmHg (10/30 0637) SpO2:  [98 %-100 %] 100 % (10/30 1191) Weight change:  Last BM Date: 05/05/12  Intake/Output from previous day: 10/29 0701 - 10/30 0700 In: 496.3 [P.O.:120; I.V.:376.3] Out: 18 [Urine:4; Stool:14]     Physical Exam: General: Alert, awake, oriented x3, in no acute distress. cacechtic HEENT: No bruits, no goiter. Heart: Regular rate and rhythm, without murmurs, rubs, gallops. Lungs: Clear to auscultation bilaterally. Abdomen: Soft, nontender, nondistended, hyperactive bowel sounds. Extremities: No clubbing cyanosis or edema with positive pedal pulses. Neuro: Grossly intact, nonfocal.    Lab Results: Basic Metabolic Panel:  Basename 05/06/12 0503 05/05/12 0504 05/05/12 0030  NA 140 135 --  K 4.6 3.0* --  CL 113* 104 --  CO2 18* 22 --  GLUCOSE 97 121* --  BUN 20 27* --  CREATININE 0.73 0.76 --  CALCIUM 9.0 8.5 --  MG -- -- 2.0  PHOS -- -- 2.6   Liver Function Tests:  [facility] 05/04/12 2145  AST 23  ALT 10  ALKPHOS 88  BILITOT 0.5  PROT 7.4  ALBUMIN 3.5    Basename 05/04/12 2145  LIPASE 38  AMYLASE --   No results found for this basename: AMMONIA:2 in the last 72 hours CBC:  Basename 05/06/12 0503 05/05/12 0504 05/04/12 2145  WBC 5.2 3.6* --  NEUTROABS -- -- 4.5  HGB 9.4* 9.8* --  HCT 28.0* 28.5* --  MCV 108.5* 105.6* --  PLT 196 237 --   Cardiac Enzymes:  Basename 05/05/12 0030 05/04/12 2145  CKTOTAL 54 --  CKMB -- --  CKMBINDEX -- --  TROPONINI -- <0.30   BNP: No results found  for this basename: PROBNP:3 in the last 72 hours D-Dimer: No results found for this basename: DDIMER:2 in the last 72 hours CBG: No results found for this basename: GLUCAP:6 in the last 72 hours Hemoglobin A1C: No results found for this basename: HGBA1C in the last 72 hours Fasting Lipid Panel: No results found for this basename: CHOL,HDL,LDLCALC,TRIG,CHOLHDL,LDLDIRECT in the last 72 hours Thyroid Function Tests:  Basename 05/05/12 0030  TSH 11.884*  T4TOTAL --  FREET4 1.64  T3FREE --  THYROIDAB --   Anemia Panel:  Basename 05/05/12 0504  VITAMINB12 456  FOLATE >20.0  FERRITIN 924*  TIBC 204*  IRON 18*  RETICCTPCT 1.0   Coagulation: No results found for this basename: LABPROT:2,INR:2 in the last 72 hours Urine Drug Screen: Drugs of Abuse  No results found for this basename: labopia, cocainscrnur, labbenz, amphetmu, thcu, labbarb    Alcohol Level: No results found for this basename: ETH:2 in the last 72 hours Urinalysis:  Basename 05/05/12 0140  COLORURINE YELLOW  LABSPEC 1.020  PHURINE 7.5  GLUCOSEU NEGATIVE  HGBUR TRACE*  BILIRUBINUR NEGATIVE  KETONESUR TRACE*  PROTEINUR TRACE*  UROBILINOGEN 0.2  NITRITE NEGATIVE  LEUKOCYTESUR NEGATIVE    Recent Results (from the past 240 hour(s))  URINE CULTURE     Status: Normal (Preliminary result)   Collection  Time   05/05/12  1:40 AM      Component Value Range Status Comment   Specimen Description URINE, CLEAN CATCH   Final    Special Requests NONE   Final    Culture  Setup Time 05/05/2012 20:11   Final    Colony Count 75,000 COLONIES/ML   Final    Culture GRAM NEGATIVE RODS   Final    Report Status PENDING   Incomplete   STOOL CULTURE     Status: Normal (Preliminary result)   Collection Time   05/05/12  4:13 AM      Component Value Range Status Comment   Specimen Description STOOL   Final    Special Requests NONE   Final    Culture Culture reincubated for better growth   Final    Report Status PENDING    Incomplete   CLOSTRIDIUM DIFFICILE BY PCR     Status: Normal   Collection Time   05/05/12  4:13 AM      Component Value Range Status Comment   C difficile by pcr NEGATIVE  NEGATIVE Final     Studies/Results: Dg Abd Acute W/chest  05/04/2012  *RADIOLOGY REPORT*  Clinical Data: Failure to thrive.  ACUTE ABDOMEN SERIES (ABDOMEN 2 VIEW & CHEST 1 VIEW)  Comparison: Plain film of the chest 11/02/2010.  Findings: Single view of the chest demonstrates pulmonary hyperexpansion and attenuation of the pulmonary vasculature suggesting emphysema.  The lungs are clear.  Heart size is normal.  Two views of the abdomen show no free intraperitoneal air.  The bowel gas pattern is unremarkable.  Bilateral hip replacements and convex left scoliosis are noted.  IMPRESSION: No acute finding chest or abdomen.   Original Report Authenticated By: Bernadene Bell. Maricela Curet, M.D.     Medications: Scheduled Meds:   . aspirin  81 mg Oral Daily  . azaTHIOprine  100 mg Oral Daily  . feeding supplement  237 mL Oral BID BM  . fluticasone  2 spray Each Nare Daily  . folic acid  1 mg Oral Daily  . metoprolol succinate  12.5 mg Oral Daily  . metronidazole  500 mg Intravenous Q8H  . multivitamin with minerals  1 tablet Oral Daily  . potassium chloride  40 mEq Oral Q3H  . predniSONE  10 mg Oral Q breakfast  . predniSONE  20 mg Oral BID WC  . sodium chloride       Continuous Infusions:   . sodium chloride    . DISCONTD: 0.9 % NaCl with KCl 40 mEq / L 75 mL/hr at 05/06/12 0226  . DISCONTD: sodium chloride 0.9 % 1,000 mL with potassium chloride 40 mEq infusion     PRN Meds:.acetaminophen, acetaminophen, albuterol, ALPRAZolam, alum & mag hydroxide-simeth, loperamide, loperamide, ondansetron (ZOFRAN) IV, ondansetron  Assessment/Plan:  Active Problems:  PAF (paroxysmal atrial fibrillation)  Hypothyroidism  Weight loss, unintentional  Muscle weakness (generalized)  Malnutrition/Cachexia  Failure to thrive  Falls   Diarrhea  Orthostatic hypotension  Macrocytosis  Dehydration  Lactic acidosis  History of hip fracture  Depression with anxiety  Inclusion body myositis  Allergic rhinitis  Dysphagia  Hypokalemia  Plan:  1. Persistent diarrhea.  May be viral in origin.  C diff was found to be negative.  Patient is on chronic imuran for inclusion body myositis.  She is immunocompromised.  We will continue with supportive treatment for now.  She has been started on imodium. She does have some mild metabolic acidosis which is probably  secondary to GI losses of bicarb. Will discontinue flagyl since C diff negative. Continue rectal tube for now.  2. Heme positive stools.  Patient has been complaining of black stools.  In the hospital, stools were found to be brown in color, but were noted to be heme positive. Patient has also had significant, unintentional weight loss over the past few years, reportedly of 70lbs. Will ask GI to see patient regarding need for colonoscopy.  Of note, she was taking xarelto prior to admission for paroxysmal a fib, which has since been discontinued.  3. Hypothyroidism.  Patient reports she has been compliant with her medications.  TSH is elevated.  Will increase daily dose. Repeat thyroid function tests in 4 weeks  4. 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  Started on prednisone during this admission with the hopes of stimulating appetite and slowing progression of disease.  5. Paroxysmal a fib.  Patient is currently in sinus rhythm.  She was felt to be a high fall risk, is very debilitated, and not a good candidate for chronic anticoagulation.  Xarelto has been discontinued. She is now on aspirin  Time spent coordinating care:   LOS: 2 days   MEMON,JEHANZEB [facility] Pager: [phone] 05/06/2012, 12:22 PM

## 2012-05-06 NOTE — Progress Notes (Signed)
Patient's c diff was negative. Patient was asking for something to make her stop having diarrhea. Doctor was notified and new orders were given.

## 2012-05-06 NOTE — Progress Notes (Signed)
Patient was having trouble sleeping. Xanax had been given at 1990 but patient still was having problems. RN asked doctor if she could give another xanax now. Doctor stated it was fine.

## 2012-05-06 NOTE — Consult Note (Signed)
Reason for Consult: diarrhea Referring Physician: Dr.Memon  [name] is an 78 y.o. female.  I am unable to elicit a hx from patient this afternoon.  HPI: Admitted thru athe ED with acute, praiofuse diarrhea.  She apparently was having 5-6 dark, watery stools. There was no bright red blood per chart. She has a hx of constipation She has been bed bound for the past 2 weeks.   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 During the night the RN tells me she was changed about 20 times due to her diarrhea. Hx of cachexia. Has multiple falls.  She has had 2 negative C-difficiles.  Rectal tube is in place due to her diffuse diarrhea. She has had a signficant weight loss from 150 to 82 pounds over the past 2-3 yrs per Dr. Phillips Odor dictated notes.       Past Medical History  Diagnosis Date  . Hypothyroidism   . Atrial fibrillation   . Myositis     BODY  . LVH (left ventricular hypertrophy)     MILD  . Dizziness   . Headache   . Weakness   . Chest pain   . DOE (dyspnea on exertion)   . Poor appetite   . UTI (urinary tract infection)   . Dysphagia     History of dysphagia 2, depressed by esophagus status post dilation  . Arthritis   . Hypokalemia   . History of anxiety   . Orthostatic hypotension   . Inclusion body myositis   . Easy bruising   . Complication of anesthesia     anxiety before anesthesia, wants spinal  . Myalgia and myositis, unspecified 04/30/2011    Past Surgical History  Procedure Date  . Total hip arthroplasty 2000 or 2001    right  . Transthoracic echocardiogram 07/08/10    EF 55-60%  . Left total hip arthroplasty 1996  . Total hip revision 12/18/2011    Procedure: TOTAL HIP REVISION;  Surgeon: Loanne Drilling, MD;  Location: [facility];  Service: Orthopedics;  Laterality: Right;  Right Hip Revision to Constrained Liner-acetabular     History reviewed. No pertinent family history.  Social History:  reports that she has  never smoked. She has never used smokeless tobacco. She reports that she does not drink alcohol or use illicit drugs.  Allergies:  Allergies  Allergen Reactions  . Codeine Other (See Comments)    unknown  . Morphine And Related Other (See Comments)    unknown  . Piroxicam Other (See Comments)    unknown    Medications: I have reviewed the patient's current medications.  Results for orders placed during the hospital encounter of 05/04/12 (from the past 48 hour(s))  CBC WITH DIFFERENTIAL     Status: Abnormal   Collection Time   05/04/12  9:45 PM      Component Value Range Comment   WBC 5.3  4.0 - 10.5 K/uL    RBC 3.34 (*) 3.87 - 5.11 MIL/uL    Hemoglobin 12.2  12.0 - 15.0 g/dL    HCT 45.4 (*) 09.8 - 46.0 %    MCV 106.9 (*) 78.0 - 100.0 fL    MCH 36.5 (*) 26.0 - 34.0 pg    MCHC 34.2  30.0 - 36.0 g/dL    RDW 11.9 (*) 14.7 - 15.5 %    Platelets 269  150 - 400 K/uL    Neutrophils Relative 85 (*)  43 - 77 %    Neutro Abs 4.5  1.7 - 7.7 K/uL    Lymphocytes Relative 9 (*) 12 - 46 %    Lymphs Abs 0.5 (*) 0.7 - 4.0 K/uL    Monocytes Relative 6  3 - 12 %    Monocytes Absolute 0.3  0.1 - 1.0 K/uL    Eosinophils Relative 0  0 - 5 %    Eosinophils Absolute 0.0  0.0 - 0.7 K/uL    Basophils Relative 0  0 - 1 %    Basophils Absolute 0.0  0.0 - 0.1 K/uL   COMPREHENSIVE METABOLIC PANEL     Status: Abnormal   Collection Time   05/04/12  9:45 PM      Component Value Range Comment   Sodium 132 (*) 135 - 145 mEq/L    Potassium 4.0  3.5 - 5.1 mEq/L    Chloride 96  96 - 112 mEq/L    CO2 24  19 - 32 mEq/L    Glucose, Bld 102 (*) 70 - 99 mg/dL    BUN 26 (*) 6 - 23 mg/dL    Creatinine, Ser 1.47  0.50 - 1.10 mg/dL    Calcium 9.9  8.4 - 82.9 mg/dL    Total Protein 7.4  6.0 - 8.3 g/dL    Albumin 3.5  3.5 - 5.2 g/dL    AST 23  0 - 37 U/L    ALT 10  0 - 35 U/L    Alkaline Phosphatase 88  39 - 117 U/L    Total Bilirubin 0.5  0.3 - 1.2 mg/dL    GFR calc non Af Amer 59 (*) >90 mL/min    GFR calc  Af Amer 69 (*) >90 mL/min   LIPASE, BLOOD     Status: Normal   Collection Time   05/04/12  9:45 PM      Component Value Range Comment   Lipase 38  11 - 59 U/L   TROPONIN I     Status: Normal   Collection Time   05/04/12  9:45 PM      Component Value Range Comment   Troponin I <0.30  <0.30 ng/mL   LACTIC ACID, PLASMA     Status: Abnormal   Collection Time   05/04/12  9:58 PM      Component Value Range Comment   Lactic Acid, Venous 3.3 (*) 0.5 - 2.2 mmol/L MODERATE HEMOLYSIS  SEDIMENTATION RATE     Status: Abnormal   Collection Time   05/05/12 12:30 AM      Component Value Range Comment   Sed Rate 52 (*) 0 - 22 mm/hr   CK     Status: Normal   Collection Time   05/05/12 12:30 AM      Component Value Range Comment   Total CK 54  7 - 177 U/L   TSH     Status: Abnormal   Collection Time   05/05/12 12:30 AM      Component Value Range Comment   TSH 11.884 (*) 0.350 - 4.500 uIU/mL   T4, FREE     Status: Normal   Collection Time   05/05/12 12:30 AM      Component Value Range Comment   Free T4 1.64  0.80 - 1.80 ng/dL   MAGNESIUM     Status: Normal   Collection Time   05/05/12 12:30 AM      Component Value Range Comment   Magnesium 2.0  1.5 -  2.5 mg/dL   PHOSPHORUS     Status: Normal   Collection Time   05/05/12 12:30 AM      Component Value Range Comment   Phosphorus 2.6  2.3 - 4.6 mg/dL   URINALYSIS, ROUTINE W REFLEX MICROSCOPIC     Status: Abnormal   Collection Time   05/05/12  1:40 AM      Component Value Range Comment   Color, Urine YELLOW  YELLOW    APPearance CLEAR  CLEAR    Specific Gravity, Urine 1.020  1.005 - 1.030    pH 7.5  5.0 - 8.0    Glucose, UA NEGATIVE  NEGATIVE mg/dL    Hgb urine dipstick TRACE (*) NEGATIVE    Bilirubin Urine NEGATIVE  NEGATIVE    Ketones, ur TRACE (*) NEGATIVE mg/dL    Protein, ur TRACE (*) NEGATIVE mg/dL    Urobilinogen, UA 0.2  0.0 - 1.0 mg/dL    Nitrite NEGATIVE  NEGATIVE    Leukocytes, UA NEGATIVE  NEGATIVE   URINE CULTURE      Status: Normal (Preliminary result)   Collection Time   05/05/12  1:40 AM      Component Value Range Comment   Specimen Description URINE, CLEAN CATCH      Special Requests NONE      Culture  Setup Time 05/05/2012 20:11      Colony Count 75,000 COLONIES/ML      Culture GRAM NEGATIVE RODS      Report Status PENDING     URINE MICROSCOPIC-ADD ON     Status: Abnormal   Collection Time   05/05/12  1:40 AM      Component Value Range Comment   Squamous Epithelial / LPF MANY (*) RARE    WBC, UA 3-6  <3 WBC/hpf    RBC / HPF 0-2  <3 RBC/hpf    Bacteria, UA FEW (*) RARE   OCCULT BLOOD X 1 CARD TO LAB, STOOL     Status: Normal   Collection Time   05/05/12  4:13 AM      Component Value Range Comment   Fecal Occult Bld POSITIVE     STOOL CULTURE     Status: Normal (Preliminary result)   Collection Time   05/05/12  4:13 AM      Component Value Range Comment   Specimen Description STOOL      Special Requests NONE      Culture Culture reincubated for better growth      Report Status PENDING     CLOSTRIDIUM DIFFICILE BY PCR     Status: Normal   Collection Time   05/05/12  4:13 AM      Component Value Range Comment   C difficile by pcr NEGATIVE  NEGATIVE   BASIC METABOLIC PANEL     Status: Abnormal   Collection Time   05/05/12  5:04 AM      Component Value Range Comment   Sodium 135  135 - 145 mEq/L    Potassium 3.0 (*) 3.5 - 5.1 mEq/L DELTA CHECK NOTED   Chloride 104  96 - 112 mEq/L    CO2 22  19 - 32 mEq/L    Glucose, Bld 121 (*) 70 - 99 mg/dL    BUN 27 (*) 6 - 23 mg/dL    Creatinine, Ser 1.61  0.50 - 1.10 mg/dL    Calcium 8.5  8.4 - 09.6 mg/dL    GFR calc non Af Amer 75 (*) >90 mL/min  GFR calc Af Amer 87 (*) >90 mL/min   CBC     Status: Abnormal   Collection Time   05/05/12  5:04 AM      Component Value Range Comment   WBC 3.6 (*) 4.0 - 10.5 K/uL    RBC 2.70 (*) 3.87 - 5.11 MIL/uL    Hemoglobin 9.8 (*) 12.0 - 15.0 g/dL    HCT 60.4 (*) 54.0 - 46.0 %    MCV 105.6 (*) 78.0 -  100.0 fL    MCH 36.3 (*) 26.0 - 34.0 pg    MCHC 34.4  30.0 - 36.0 g/dL    RDW 98.1 (*) 19.1 - 15.5 %    Platelets 237  150 - 400 K/uL   VITAMIN B12     Status: Normal   Collection Time   05/05/12  5:04 AM      Component Value Range Comment   Vitamin B-12 456  211 - 911 pg/mL   IRON AND TIBC     Status: Abnormal   Collection Time   05/05/12  5:04 AM      Component Value Range Comment   Iron 18 (*) 42 - 135 ug/dL    TIBC 478 (*) 295 - 621 ug/dL    Saturation Ratios 9 (*) 20 - 55 %    UIBC 186  125 - 400 ug/dL   FERRITIN     Status: Abnormal   Collection Time   05/05/12  5:04 AM      Component Value Range Comment   Ferritin 924 (*) 10 - 291 ng/mL   FOLATE     Status: Normal   Collection Time   05/05/12  5:04 AM      Component Value Range Comment   Folate >20.0     RETICULOCYTES     Status: Abnormal   Collection Time   05/05/12  5:04 AM      Component Value Range Comment   Retic Ct Pct 1.0  0.4 - 3.1 %    RBC. 2.70 (*) 3.87 - 5.11 MIL/uL    Retic Count, Manual 27.0  19.0 - 186.0 K/uL   CBC     Status: Abnormal   Collection Time   05/06/12  5:03 AM      Component Value Range Comment   WBC 5.2  4.0 - 10.5 K/uL    RBC 2.58 (*) 3.87 - 5.11 MIL/uL    Hemoglobin 9.4 (*) 12.0 - 15.0 g/dL    HCT 30.8 (*) 65.7 - 46.0 %    MCV 108.5 (*) 78.0 - 100.0 fL    MCH 36.4 (*) 26.0 - 34.0 pg    MCHC 33.6  30.0 - 36.0 g/dL    RDW 84.6 (*) 96.2 - 15.5 %    Platelets 196  150 - 400 K/uL   BASIC METABOLIC PANEL     Status: Abnormal   Collection Time   05/06/12  5:03 AM      Component Value Range Comment   Sodium 140  135 - 145 mEq/L    Potassium 4.6  3.5 - 5.1 mEq/L DELTA CHECK NOTED   Chloride 113 (*) 96 - 112 mEq/L    CO2 18 (*) 19 - 32 mEq/L    Glucose, Bld 97  70 - 99 mg/dL    BUN 20  6 - 23 mg/dL    Creatinine, Ser 9.52  0.50 - 1.10 mg/dL    Calcium 9.0  8.4 - 84.1 mg/dL  GFR calc non Af Amer 76 (*) >90 mL/min    GFR calc Af Amer 88 (*) >90 mL/min     Dg Abd Acute  W/chest  05/04/2012  *RADIOLOGY REPORT*  Clinical Data: Failure to thrive.  ACUTE ABDOMEN SERIES (ABDOMEN 2 VIEW & CHEST 1 VIEW)  Comparison: Plain film of the chest 11/02/2010.  Findings: Single view of the chest demonstrates pulmonary hyperexpansion and attenuation of the pulmonary vasculature suggesting emphysema.  The lungs are clear.  Heart size is normal.  Two views of the abdomen show no free intraperitoneal air.  The bowel gas pattern is unremarkable.  Bilateral hip replacements and convex left scoliosis are noted.  IMPRESSION: No acute finding chest or abdomen.   Original Report Authenticated By: Bernadene Bell. Maricela Curet, M.D.     Review of Systems  Skin: Negative for itching.   Blood pressure 127/52, pulse 65, temperature 97.7 F (36.5 C), temperature source Oral, resp. rate 20, height 5\' 5"  (1.651 m), weight 97 lb (44 kg), SpO2 100.00%. Physical Exam   Skin warm and dry. Oral mucosa is moist.   . Sclera anicteric, conjunctivae is pink. Thyroid not enlarged. No cervical lymphadenopathy. Lungs clear. Heart regular rate and rhythm.  Abdomen is soft. Bowel sounds are positive.  . No abdominal masses felt. No tenderness.  No edema to lower extremities.. Rectal tube in place with large amount watery diarrhea in collection bag. Assessment/Plan: Profuse diarrhea ? Infectious in nature. C-diff x 2 is negative. Stools studies are pending. Agree with IV flagyl at this time   Llana Aliment 05/06/2012, 1:47 PM    GI attending note. Patient interviewed and examined. Lab studies reviewed. Patient's 78 year old Caucasian female with history of myositis who's been maintained on 6-MP and appears to have been in poor health for the last few years who has lost close to 70 pounds who presents with copious diarrhea of recent onset. His baseline was constipation. She has large volume diarrhea. Her stool was guaiac positive the Stool C. difficile negative and other studies are pending. She has normal B12 level  and serum albumin is low normal. She also does not have eosinophilia. TSH level is elevated suggesting hypothyroidism. She appears chronically ill but does not appear to be toxic and her abdominal examination is normal. She has generalized wasting and appears to be cachectic. Stool volume is being monitored. She possibly has secretory diarrhea or also need to rule out colitis given that she is immune compromised. Will talk with her daughter who has power of attorney and consider flexible sigmoidoscopy before other studies considered.

## 2012-05-06 NOTE — Progress Notes (Signed)
Called by RN X2 re: Profuse watery diarrhea, C. Diff negative, stool cultures pending, ordered loperimide, but not helping-will start empiric flagyl for possible infectious etiology since she is on imuran and may also help with slowing motility and secretory diarrhea. May also consider using questran if diarrhea continues. Flexiseal to prevent skin breakdown, perineal area and buttock is becoming red and inflammed.  Anderson Malta, DO

## 2012-05-06 NOTE — Clinical Documentation Improvement (Signed)
MALNUTRITION DOCUMENTATION CLARIFICATION  THIS DOCUMENT IS NOT A PERMANENT PART OF THE MEDICAL RECORD  TO RESPOND TO THE THIS QUERY, FOLLOW THE INSTRUCTIONS BELOW:  1. If needed, update documentation for the patient's encounter via the notes activity.  2. Access this query again and click edit on the In [employer].  3. After updating, or not, click F2 to complete all highlighted (required) fields concerning your review. Select "additional documentation in the medical record" OR "no additional documentation provided".  4. Click Sign note button.  5. The deficiency will fall out of your In Basket *Please let us know if you are not able to complete this workflow by phone or e-mail (listed below).  Please update your documentation within the medical record to reflect your response to this query.                                                                                        05/06/12   Dear Dr. Kerry Hough / Associates,  In a better effort to capture your patient's severity of illness, reflect appropriate length of stay and utilization of resources, a review of the patient medical record has revealed the following indicators.  Based on your clinical judgment, please clarify and document in a progress note and/or discharge summary the clinical condition associated with the following supporting information:  In responding to this query please exercise your independent judgment.  The fact that a query is asked, does not imply that any particular answer is desired or expected.  Please clarify the severity of malnutrition:  Possible Clinical Conditions?  Mild Malnutrition  Moderate Malnutrition Severe Malnutrition   Protein Calorie Malnutrition Severe Protein Calorie Malnutrition Other Condition________________ Cannot clinically determine   Clinical Information:   Risk Factors: Malnutrition Cachexia Failure to Thrive Unintentional weight loss Inclusion body myositis  Per RD 1/29:  Pt meets criteria for severe malnutrition in the context of chronic illness as evidenced by </=75% intake energy/= 1 month and orbital fat loss (hollow look) and significant muscle wasting, and severe unintentional wt loss.   Signs & Symptoms: -Ht: 5'5" Wt: 97 lbs -BMI: 16.14  -Diagnostics: -Albumin level: 3.5 -Total Protein: 7.4 -Calcium level: 8.5  Treatments: IV NS w/50mEq KCL @75ml /h Multivitamin -Nutrition Consult:NUTRITION DIAGNOSIS: Inadequate oral intake related to poor appetite as evidenced by FTT, hx of poor oral intake and severe unintentional wt loss >50#, 35% MONITORING/EVALUATION(Goals): Monitor for plan of care (disposition), SLP eval  Goal: Pt to meet >/= 90% of their estimated nutrition needs; not met INTERVENTION: Ensure Complete po BID, each supplement provides 350 kcal and 13 grams of protein.   You may use possible, probable, or suspect with inpatient documentation. possible, probable, suspected diagnoses MUST be documented at the time of discharge  Reviewed: Query answered by Dr. Sherrie Mustache in D/C summary Thank You,  Harless Litten RN, MSN Clinical Documentation Specialist: Office# [phone] [facility] Information Management [place]

## 2012-05-07 ENCOUNTER — Encounter (HOSPITAL_COMMUNITY): Payer: Self-pay | Admitting: *Deleted

## 2012-05-07 ENCOUNTER — Encounter (HOSPITAL_COMMUNITY)
Admission: EM | Disposition: A | Discharge status: Skilled Nursing Facility | Payer: Self-pay | Source: Home / Self Care | Attending: Internal Medicine

## 2012-05-07 DIAGNOSIS — E46 Unspecified protein-calorie malnutrition: Secondary | ICD-10-CM

## 2012-05-07 DIAGNOSIS — G7241 Inclusion body myositis [IBM]: Secondary | ICD-10-CM

## 2012-05-07 DIAGNOSIS — R634 Abnormal weight loss: Secondary | ICD-10-CM

## 2012-05-07 HISTORY — PX: FLEXIBLE SIGMOIDOSCOPY: SHX5431

## 2012-05-07 LAB — CBC
Hemoglobin: 8.9 g/dL — ABNORMAL LOW (ref 12.0–15.0)
RBC: 2.4 MIL/uL — ABNORMAL LOW (ref 3.87–5.11)
WBC: 5.6 10*3/uL (ref 4.0–10.5)

## 2012-05-07 LAB — URINE CULTURE: Colony Count: 75000

## 2012-05-07 LAB — BASIC METABOLIC PANEL
CO2: 16 mEq/L — ABNORMAL LOW (ref 19–32)
Glucose, Bld: 110 mg/dL — ABNORMAL HIGH (ref 70–99)
Potassium: 3.5 mEq/L (ref 3.5–5.1)
Sodium: 135 mEq/L (ref 135–145)

## 2012-05-07 SURGERY — SIGMOIDOSCOPY, FLEXIBLE
Anesthesia: Moderate Sedation

## 2012-05-07 MED ORDER — MIDAZOLAM HCL 5 MG/5ML IJ SOLN
INTRAMUSCULAR | Status: AC
  Filled 2012-05-07: qty 10

## 2012-05-07 MED ORDER — MIDAZOLAM HCL 5 MG/5ML IJ SOLN
INTRAMUSCULAR | Status: DC | PRN
  Administered 2012-05-07 (×3): 1 mg via INTRAVENOUS

## 2012-05-07 MED ORDER — MEPERIDINE HCL 50 MG/ML IJ SOLN
INTRAMUSCULAR | Status: AC
  Filled 2012-05-07: qty 1

## 2012-05-07 MED ORDER — MEPERIDINE HCL 25 MG/ML IJ SOLN
INTRAMUSCULAR | Status: DC | PRN
  Administered 2012-05-07: 10 mg via INTRAVENOUS
  Administered 2012-05-07: 20 mg via INTRAVENOUS

## 2012-05-07 MED ORDER — STERILE WATER FOR INJECTION IV SOLN
INTRAVENOUS | Status: DC
  Administered 2012-05-07 – 2012-05-09 (×4): via INTRAVENOUS
  Filled 2012-05-07 (×10): qty 9.7

## 2012-05-07 MED ORDER — STERILE WATER FOR IRRIGATION IR SOLN
Status: DC | PRN
  Administered 2012-05-07: 16:00:00

## 2012-05-07 MED ORDER — FLEET ENEMA 7-19 GM/118ML RE ENEM
2.0000 | ENEMA | Freq: Once | RECTAL | Status: AC
  Administered 2012-05-07: 2 via RECTAL

## 2012-05-07 MED ORDER — STERILE WATER FOR INJECTION IV SOLN: INTRAVENOUS | Status: DC

## 2012-05-07 NOTE — Progress Notes (Signed)
Physical Therapy Treatment Patient Details Name: [name] MRN: [id number] DOB: 1926-03-18 Today's Date: 05/07/2012 Time: 4098-1191 PT Time Calculation (min): 42 min  PT Assessment / Plan / Recommendation Comments on Treatment Session  Pt c/o not sleeping well last night and is very tired.  She is agreeable to do only bed mobility/transfers...she feels that it is too much to do ther ex also.  She is able to sit at EOB unsupported  for at least 5 min with good stability.  She is not able to functionally stand.  She is slightly stronger than at time of eval.  Son was present during tx.  We discussed her care at home and he does not feel that he can manage her care.  He also doesn't know if anyone else in the familiy can do it.  I did broach the subject of SNF, but he did not seem too interested.   He seems to be under the impression that pt will miraculously get her strength back here in the hospital.  Since she has shown a bit of improvement, she might be appropriate for SNF if medical prognosis warrants this.    Follow Up Recommendations  Post acute inpatient     Does the patient have the potential to tolerate intense rehabilitation  No, Recommend SNF  Barriers to Discharge        Equipment Recommendations       Recommendations for Other Services    Frequency     Plan Discharge plan needs to be updated;Frequency remains appropriate    Precautions / Restrictions     Pertinent Vitals/Pain     Mobility  Bed Mobility Rolling Right: 6: Modified independent (Device/Increase time) Rolling Left: 6: Modified independent (Device/Increase time) Right Sidelying to Sit: 5: Supervision Left Sidelying to Sit: Not tested (comment) Supine to Sit: Not tested (comment) Sit to Sidelying Right: 5: Supervision Sit to Sidelying Left: Not Tested (comment) Details for Bed Mobility Assistance: transfer supine to sit is very labored due to UE weakness, but she is able to complete transfer with only  verbal cues for technique Transfers Sit to Stand: 4: Min assist;With upper extremity assist;From bed Stand to Sit: With upper extremity assist;5: Supervision;To bed Details for Transfer Assistance: Pt able to stand to walker but is unable to hold stance for any time...she immediately sits down.  Snce seated, she said that sitting at EOB was as much as she was strong enough to do. Ambulation/Gait Ambulation/Gait Assistance: Not tested (comment)    Exercises     PT Diagnosis:    PT Problem List:   PT Treatment Interventions:     PT Goals Acute Rehab PT Goals PT Goal Formulation: With patient/family Potential to Achieve Goals: Fair Pt will go Supine/Side to Sit: with modified independence PT Goal: Supine/Side to Sit - Progress: Updated due to goal met Pt will Stand: with min assist;with bilateral upper extremity support;1 - 2 min PT Goal: Stand - Progress: Goal set today  Visit Information  Last PT Received On: 05/07/12    Subjective Data  Subjective: "I just can't take care of [name] like I used to" Patient Stated Goal: son wants Pt to eat more so that she can get stronger   Cognition       Balance     End of Session PT - End of Session Equipment Utilized During Treatment: Gait belt Activity Tolerance: Patient limited by fatigue Patient left: in bed;with call bell/phone within reach;with family/visitor present   GP  Myrlene Broker L 05/07/2012, 2:43 PM

## 2012-05-07 NOTE — Op Note (Signed)
FLEXIBLE SIGMOIDOSCOPY  PROCEDURE REPORT  PATIENT:  [name]  MR#:  [id number] Birthdate:  1926-06-28, 78 y.o., female Endoscopist:  Dr. Malissa Hippo, MD Procedure Date: 05/07/2012  Procedure:   Flexible sigmoidoscopy.  Indications:  Patient is 78 year old Caucasian female with history of myositis on 6-MP who presents with copious diarrhea stool studies so far are negative. She is undergoing diagnostic flexible sigmoidoscopy.  Informed Consent:  The procedure and risks were reviewed with the patient and informed consent was obtained.  Medications:  Demerol 30 mg IV Versed 3 mg IV  Description of procedure:   Procedure performed in endoscopy suite. Patient's vital signs and O2 sat were monitored during the procedure and remained stable. Patient was placed in left lateral recumbent position and rectal examination performed. Rectal tone was diminished. Olympus pediatric colonoscope was placed in the rectum and advanced under vision and sigmoid colon. Prep was excellent. Scope was passed to 50 cm and then gradually withdrawn with careful inspection of mucosa. Scope was also retroflexed in the rectum to exam anorectal junction.  Findings:   Normal mucosa of sigmoid colon and rectum. Unremarkable anorectal junction. Random biopsies taken from mucosa of sigmoid colon.  Therapeutic/Diagnostic Maneuvers Performed:  See above  Complications:  None   Impression:  Normal flexible sigmoidoscopy to 50 cms. Random biopsies taken from mucosa of sigmoid colon looking for microscopic or eosinophilic colitis.  Recommendations:  Proceed with 24-hour stool collection for electrolytes and osmolality.  Jonta Gastineau U  05/07/2012 5:07 PM  CC: Dr. Herb Grays, MD & Dr. Bonnetta Barry ref. provider found

## 2012-05-07 NOTE — Progress Notes (Addendum)
Patient ID: [name], female   DOB: August 27, 1925, 78 y.o.   MRN: [id number] She is more talkative this am. No abdominal pain this am. She tells me she had a headache and epigastric pain last night/ She tells me she is not hungry this am.  O.  Filed Vitals:   05/06/12 0637 05/06/12 1400 05/06/12 2219 05/07/12 0541  BP: 127/52 110/42 115/66 92/54  Pulse: 65 64 115 101  Temp: 97.7 F (36.5 C) 98.1 F (36.7 C) 97.5 F (36.4 C) 98.5 F (36.9 C)  TempSrc: Oral Oral Oral Oral  Resp: 20 18 20 20   Height:      Weight:      SpO2: 100% 100% 96% 98%  .i More alert. Oral temp 99.8.  . Skin warm and dry. Oral mucosa is moist.   . Sclera anicteric, conjunctivae is pink. Thyroid not enlarged. No cervical lymphadenopathy. Lungs clear. Heart regular rate and rhythm.  Abdomen is soft. Bowel sounds are positive. No hepatomegaly. No abdominal masses felt. No tenderness.  No edema to lower extremities.  Assessment: Diarrhea, possibly infectious. She does have a temp this am. More alert. Stool studies are pending. Plan: Preliminary stool studies are negative. Further recommendations once stool studies are back.  GI attending note; 2 culture remains negative. Patient has high volume diarrhea and we may be dealing with secretory diarrhea. Before we start looking for causes of sig regarding need to rule out colitis. Will proceed with flexible sigmoidoscopy. Condition discussed with her son Onalee Hua who is in the room with the patient are both agreeable. Asian would like to be sedated for this procedure

## 2012-05-07 NOTE — Clinical Social Work Note (Addendum)
Per MD, pt and family are willing to consider SNF now. CSW went by room, and neither pt or family are in room. CSW will fax out referral and follow up tomorrow to discuss further and provide bed offers if available. CSW updated Melanie at [facility] on change of d/c plans.   Derenda Fennel, [place] [phone]

## 2012-05-07 NOTE — Progress Notes (Addendum)
[facility]             Progress Note   Subjective: Still having large volume diarrhea. She denies any abdominal pain.  Appetite has been poor.   Objective: Vital signs in last 24 hours: Temp:  [97.5 F (36.4 C)-98.5 F (36.9 C)] 98 F (36.7 C) (10/31 1512) Pulse Rate:  [99-115] 99  (10/31 1333) Resp:  [19-20] 19  (10/31 1512) BP: (92-119)/(54-75) 119/75 mmHg (10/31 1512) SpO2:  [96 %-100 %] 100 % (10/31 1512) Weight change:  Last BM Date: 05/07/12  Intake/Output from previous day: 10/30 0701 - 10/31 0700 In: 1670 [P.O.:360; I.V.:1310] Out: 1457 [Urine:4; Stool:1453] Total I/O In: 1087.5 [P.O.:360; I.V.:727.5] Out: -    Physical Exam: General: Alert, awake, oriented x3, in no acute distress. cachectic HEENT: No bruits, no goiter. Heart: Regular rate and rhythm, without murmurs, rubs, gallops. Lungs: Clear to auscultation bilaterally. Abdomen: Soft, nontender, nondistended, hyperactive bowel sounds. Extremities: No clubbing cyanosis or edema with positive pedal pulses. Neuro: Grossly intact, nonfocal.    Lab Results: Basic Metabolic Panel:  Basename 05/07/12 0546 05/06/12 0503 05/05/12 0030  NA 135 140 --  K 3.5 4.6 --  CL 109 113* --  CO2 16* 18* --  GLUCOSE 110* 97 --  BUN 17 20 --  CREATININE 0.64 0.73 --  CALCIUM 9.0 9.0 --  MG -- -- 2.0  PHOS -- -- 2.6   Liver Function Tests:  [facility] 05/04/12 2145  AST 23  ALT 10  ALKPHOS 88  BILITOT 0.5  PROT 7.4  ALBUMIN 3.5    Basename 05/04/12 2145  LIPASE 38  AMYLASE --   No results found for this basename: AMMONIA:2 in the last 72 hours CBC:  Basename 05/07/12 0546 05/06/12 0503 05/04/12 2145  WBC 5.6 5.2 --  NEUTROABS -- -- 4.5  HGB 8.9* 9.4* --  HCT 25.7* 28.0* --  MCV 107.1* 108.5* --  PLT 181 196 --   Cardiac Enzymes:  Basename 05/05/12 0030 05/04/12 2145  CKTOTAL 54 --  CKMB -- --  CKMBINDEX -- --  TROPONINI -- <0.30   BNP: No results found for this basename:  PROBNP:3 in the last 72 hours D-Dimer: No results found for this basename: DDIMER:2 in the last 72 hours CBG: No results found for this basename: GLUCAP:6 in the last 72 hours Hemoglobin A1C: No results found for this basename: HGBA1C in the last 72 hours Fasting Lipid Panel: No results found for this basename: CHOL,HDL,LDLCALC,TRIG,CHOLHDL,LDLDIRECT in the last 72 hours Thyroid Function Tests:  Basename 05/05/12 0030  TSH 11.884*  T4TOTAL --  FREET4 1.64  T3FREE --  THYROIDAB --   Anemia Panel:  Basename 05/05/12 0504  VITAMINB12 456  FOLATE >20.0  FERRITIN 924*  TIBC 204*  IRON 18*  RETICCTPCT 1.0   Coagulation: No results found for this basename: LABPROT:2,INR:2 in the last 72 hours Urine Drug Screen: Drugs of Abuse  No results found for this basename: labopia,  cocainscrnur,  labbenz,  amphetmu,  thcu,  labbarb    Alcohol Level: No results found for this basename: ETH:2 in the last 72 hours Urinalysis:  Basename 05/05/12 0140  COLORURINE YELLOW  LABSPEC 1.020  PHURINE 7.5  GLUCOSEU NEGATIVE  HGBUR TRACE*  BILIRUBINUR NEGATIVE  KETONESUR TRACE*  PROTEINUR TRACE*  UROBILINOGEN 0.2  NITRITE NEGATIVE  LEUKOCYTESUR NEGATIVE    Recent Results (from the past 240 hour(s))  URINE CULTURE     Status: Normal   Collection Time   05/05/12  1:40 AM      Component Value Range Status Comment   Specimen Description URINE, CLEAN CATCH   Final    Special Requests NONE   Final    Culture  Setup Time 05/05/2012 20:11   Final    Colony Count 75,000 COLONIES/ML   Final    Culture KLEBSIELLA PNEUMONIAE   Final    Report Status 05/07/2012 FINAL   Final    Organism ID, Bacteria KLEBSIELLA PNEUMONIAE   Final   STOOL CULTURE     Status: Normal (Preliminary result)   Collection Time   05/05/12  4:13 AM      Component Value Range Status Comment   Specimen Description STOOL   Final    Special Requests NONE   Final    Culture NO SUSPICIOUS COLONIES, CONTINUING TO HOLD    Final    Report Status PENDING   Incomplete   CLOSTRIDIUM DIFFICILE BY PCR     Status: Normal   Collection Time   05/05/12  4:13 AM      Component Value Range Status Comment   C difficile by pcr NEGATIVE  NEGATIVE Final   CLOSTRIDIUM DIFFICILE BY PCR     Status: Normal   Collection Time   05/07/12  9:35 AM      Component Value Range Status Comment   C difficile by pcr NEGATIVE  NEGATIVE Final     Studies/Results: No results found.  Medications: Scheduled Meds:    . aspirin  81 mg Oral Daily  . azaTHIOprine  100 mg Oral Daily  . feeding supplement  237 mL Oral BID BM  . fluticasone  2 spray Each Nare Daily  . folic acid  1 mg Oral Daily  . levothyroxine  100 mcg Oral QAC breakfast  . meperidine      . metoprolol succinate  12.5 mg Oral Daily  . midazolam      . multivitamin with minerals  1 tablet Oral Daily  . predniSONE  10 mg Oral Q breakfast  . predniSONE  20 mg Oral BID WC  . sodium phosphate  2 enema Rectal Once   Continuous Infusions:    . sodium chloride 75 mL/hr at 05/07/12 0543   PRN Meds:.acetaminophen, acetaminophen, albuterol, ALPRAZolam, alum & mag hydroxide-simeth, loperamide, ondansetron (ZOFRAN) IV, ondansetron  Assessment/Plan:  Active Problems:  PAF (paroxysmal atrial fibrillation)  Hypothyroidism  Weight loss, unintentional  Muscle weakness (generalized)  Malnutrition/Cachexia  Failure to thrive  Falls  Diarrhea  Orthostatic hypotension  Macrocytosis  Dehydration  Lactic acidosis  History of hip fracture  Depression with anxiety  Inclusion body myositis  Allergic rhinitis  Dysphagia  Hypokalemia  Plan:  1. Persistent diarrhea.  Appreciate GI assistance.  Patient will undergo flex sigmoidoscopy today to evaluate for possible colitis.  Its possible this could be a secretory diarrhea.   2. Heme positive stools.  Patient has been complaining of black stools.  In the hospital, stools were found to be brown in color, but were noted to  be heme positive. This could've been related to recent anticoagulation, could also be caused by colitis. GI following.  3. Hypothyroidism.  Patient reports she has been compliant with her medications.  TSH is elevated.  Will increase daily dose of levothyroxine. Repeat thyroid function tests in 4 weeks  4. 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  Started on prednisone during this admission with the hopes of stimulating appetite and slowing progression of disease.  5. Paroxysmal  a fib.  Patient is currently in sinus rhythm.  She was felt to be a high fall risk, is very debilitated, and not a good candidate for chronic anticoagulation.  Xarelto has been discontinued. She is now on aspirin  6. Metabolic acidosis.  Likely secondary to GI losses with persistent diarrhea.  Will start bicarb infusion.  7. Dispo.  Patient's son does not feel he can care for her at home.  Per physical therapy, she would be a candidate for SNF.  CSW is following.  Time spent coordinating care:   LOS: 3 days   Loy Little [facility] Pager: [phone] 05/07/2012, 4:10 PM

## 2012-05-08 ENCOUNTER — Ambulatory Visit: Payer: Medicare Other | Admitting: Cardiology

## 2012-05-08 DIAGNOSIS — R197 Diarrhea, unspecified: Secondary | ICD-10-CM

## 2012-05-08 DIAGNOSIS — IMO0001 Reserved for inherently not codable concepts without codable children: Secondary | ICD-10-CM

## 2012-05-08 DIAGNOSIS — E039 Hypothyroidism, unspecified: Secondary | ICD-10-CM

## 2012-05-08 DIAGNOSIS — R627 Adult failure to thrive: Secondary | ICD-10-CM

## 2012-05-08 DIAGNOSIS — R1013 Epigastric pain: Secondary | ICD-10-CM

## 2012-05-08 LAB — BASIC METABOLIC PANEL
CO2: 17 mEq/L — ABNORMAL LOW (ref 19–32)
GFR calc non Af Amer: 80 mL/min — ABNORMAL LOW (ref >90)
Glucose, Bld: 99 mg/dL (ref 70–99)
Potassium: 3.6 mEq/L (ref 3.5–5.1)
Sodium: 134 mEq/L — ABNORMAL LOW (ref 135–145)

## 2012-05-08 MED ORDER — PRO-STAT SUGAR FREE PO LIQD
30.0000 mL | Freq: Two times a day (BID) | ORAL | Status: DC
  Administered 2012-05-08 – 2012-05-09 (×3): 30 mL via ORAL
  Filled 2012-05-08 (×3): qty 30

## 2012-05-08 MED ORDER — GUAIFENESIN-DM 100-10 MG/5ML PO SYRP
5.0000 mL | ORAL_SOLUTION | ORAL | Status: DC | PRN
  Administered 2012-05-08 – 2012-05-09 (×3): 5 mL via ORAL
  Filled 2012-05-08 (×3): qty 5

## 2012-05-08 NOTE — Progress Notes (Signed)
Physical Therapy Treatment Patient Details Name: [name] MRN: [id number] DOB: January 03, 1926 Today's Date: 05/08/2012 Time: 4098-1191 PT Time Calculation (min): 21 min 12' therex 8'ther activity  PT Assessment / Plan / Recommendation Comments on Treatment Session  Patient is very tired/fatigued but agreeable to participate with therapy understanding the importance of it. Patient did well with bed exercises doing all actively. [name] was able to sit EOB with MI;however once at EOB patient began c/o of increased fatigue and inability to continue PT. She did agree to attempt standing at bedside with RW and was able to complete this x2;however not able to stand fully erect with LE or trunk. at this point patient was too fatigued  to continue  session and request to lie down, patient performed L lateral scoot to HOB x4 and lied back down. [name] is motivated to continue increasing her strength to return to [name] and was instructed on bed exercises she could do thorughout the day/evening after she rested.    Follow Up Recommendations        Does the patient have the potential to tolerate intense rehabilitation     Barriers to Discharge        Equipment Recommendations       Recommendations for Other Services    Frequency     Plan      Precautions / Restrictions     Pertinent Vitals/Pain     Mobility  Bed Mobility Bed Mobility: Sitting - Scoot to Edge of Bed Left Sidelying to Sit: 6: Modified independent (Device/Increase time);HOB elevated;With rails Sitting - Scoot to Edge of Bed: 6: Modified independent (Device/Increase time) Transfers Transfers: Lateral/Scoot Transfers Sit to Stand: From elevated surface;With upper extremity assist;3: Mod assist Stand to Sit: 3: Mod assist Lateral/Scoot Transfers: 6: Modified independent (Device/Increase time) (left to [facility]) Details for Transfer Assistance: Patient only able to stand about 70% of the way x2 Ambulation/Gait Ambulation/Gait  Assistance: Not tested (comment)    Exercises General Exercises - Upper Extremity Shoulder Flexion: Both;10 reps General Exercises - Lower Extremity Ankle Circles/Pumps: Both;10 reps Quad Sets: Both;10 reps Gluteal Sets: 10 reps Short Arc Quad: 10 reps;Both Heel Slides: Both;10 reps Hip ABduction/ADduction: Both;10 reps   PT Diagnosis:    PT Problem List:   PT Treatment Interventions:     PT Goals    Visit Information  Last PT Received On: 05/08/12    Subjective Data  Subjective: I am just so tired, I didn't get any sleep last night and don't have any energy   Cognition       Balance     End of Session PT - End of Session Equipment Utilized During Treatment: Gait belt Activity Tolerance: Patient limited by fatigue Patient left: in bed;with call bell/phone within reach;with bed alarm set   GP     [name] ATKINSO 05/08/2012, 1:06 PM

## 2012-05-08 NOTE — Progress Notes (Signed)
UR Chart Review Completed  

## 2012-05-08 NOTE — Progress Notes (Signed)
Patient has no complaints. Her appetite is good. She denies abdominal pain. She only had 85 ML of stools over the last 24 hours. Stool cultures remain negative. On and biopsy will not be out until Monday. Assessment; Acute diarrhea with high volume with significant improvement in the last 24 hours. Etiology would most likely to be infection. Since there has been remarkable improvement will cancel stool studies and DC rectal tube.

## 2012-05-08 NOTE — Clinical Social Work Note (Addendum)
CSW discussed SNF with pt. She states she understands she needs to receive rehab before she can consider going home and knows that her son cannot take care of her when she is this weak. Pt said she would like to be in [place] and bed offers provided. Pt accepts [name]. Facility notified. CSW offered to call son to discuss as well but pt said she wants to talk to him about placement as he was upset yesterday about not being able to take her back home. Awaiting stability for d/c. MD aware facility can accept pt either day on weekend if stable. Please call Eunice Blase ([name]) at [phone] if weekend d/c. Melanie at DSS is aware of decision to go to [name].   Derenda Fennel, [place] [phone]

## 2012-05-08 NOTE — Clinical Social Work Placement (Signed)
Clinical Social Work Department CLINICAL SOCIAL WORK PLACEMENT NOTE 05/08/2012  Patient:  [name], [name]  Account Number:  [id number] Admit date:  05/04/2012  Clinical Social Worker:  Derenda Fennel, LCSW  Date/time:  05/08/2012 10:45 AM  Clinical Social Work is seeking post-discharge placement for this patient at the following level of care:   SKILLED NURSING   (*CSW will update this form in Epic as items are completed)   05/08/2012  Patient/family provided with [facility] Department of Clinical Social Work's list of facilities offering this level of care within the geographic area requested by the patient (or if unable, by the patient's family).  05/08/2012  Patient/family informed of their freedom to choose among providers that offer the needed level of care, that participate in Medicare, Medicaid or managed care program needed by the patient, have an available bed and are willing to accept the patient.  05/08/2012  Patient/family informed of [facility]' ownership interest in [facility], as well as of the fact that they are under no obligation to receive care at this facility.  PASARR submitted to EDS on  PASARR number received from EDS on   FL2 transmitted to all facilities in geographic area requested by pt/family on  05/08/2012 FL2 transmitted to all facilities within larger geographic area on   Patient informed that his/her managed care company has contracts with or will negotiate with  certain facilities, including the following:     Patient/family informed of bed offers received:  05/08/2012 Patient chooses bed at [facility] [place] Physician recommends and patient chooses bed at  [facility] [place]  Patient to be transferred to  on   Patient to be transferred to facility by   The following physician request were entered in Epic:   Additional Comments:  Derenda Fennel, LCSW [phone]

## 2012-05-08 NOTE — Progress Notes (Signed)
[facility]             Progress Note   Subjective: Appetite is poor, but she is forcing herself to eat.  She feels the diarrhea may be slowing down. No nausea, vomiting or abdominal pain  Objective: Vital signs in last 24 hours: Temp:  [97.2 F (36.2 C)-98.4 F (36.9 C)] 98.4 F (36.9 C) (11/01 1357) Pulse Rate:  [87-105] 105  (11/01 1357) Resp:  [16-24] 16  (11/01 1357) BP: (91-118)/(52-85) 91/57 mmHg (11/01 1357) SpO2:  [99 %-100 %] 100 % (11/01 1357) Weight change:  Last BM Date: 05/08/12  Intake/Output from previous day: 10/31 0701 - 11/01 0700 In: 1327.5 [P.O.:600; I.V.:727.5] Out: -  Total I/O In: 340 [P.O.:340] Out: -    Physical Exam: General: Alert, awake, oriented x3, in no acute distress. cachectic HEENT: No bruits, no goiter. Heart: Regular rate and rhythm, without murmurs, rubs, gallops. Lungs: Clear to auscultation bilaterally. Abdomen: Soft, nontender, nondistended, hyperactive bowel sounds. Extremities: No clubbing cyanosis or edema with positive pedal pulses. Neuro: Grossly intact, nonfocal.    Lab Results: Basic Metabolic Panel:  Basename 05/08/12 0512 05/07/12 0546  NA 134* 135  K 3.6 3.5  CL 107 109  CO2 17* 16*  GLUCOSE 99 110*  BUN 14 17  CREATININE 0.64 0.64  CALCIUM 8.2* 9.0  MG -- --  PHOS -- --   Liver Function Tests: No results found for this basename: AST:2,ALT:2,ALKPHOS:2,BILITOT:2,PROT:2,ALBUMIN:2 in the last 72 hours No results found for this basename: LIPASE:2,AMYLASE:2 in the last 72 hours No results found for this basename: AMMONIA:2 in the last 72 hours CBC:  Basename 05/07/12 0546 05/06/12 0503  WBC 5.6 5.2  NEUTROABS -- --  HGB 8.9* 9.4*  HCT 25.7* 28.0*  MCV 107.1* 108.5*  PLT 181 196   Cardiac Enzymes: No results found for this basename: CKTOTAL:3,CKMB:3,CKMBINDEX:3,TROPONINI:3 in the last 72 hours BNP: No results found for this basename: PROBNP:3 in the last 72 hours D-Dimer: No results  found for this basename: DDIMER:2 in the last 72 hours CBG: No results found for this basename: GLUCAP:6 in the last 72 hours Hemoglobin A1C: No results found for this basename: HGBA1C in the last 72 hours Fasting Lipid Panel: No results found for this basename: CHOL,HDL,LDLCALC,TRIG,CHOLHDL,LDLDIRECT in the last 72 hours Thyroid Function Tests: No results found for this basename: TSH,T4TOTAL,FREET4,T3FREE,THYROIDAB in the last 72 hours Anemia Panel: No results found for this basename: VITAMINB12,FOLATE,FERRITIN,TIBC,IRON,RETICCTPCT in the last 72 hours Coagulation: No results found for this basename: LABPROT:2,INR:2 in the last 72 hours Urine Drug Screen: Drugs of Abuse  No results found for this basename: labopia,  cocainscrnur,  labbenz,  amphetmu,  thcu,  labbarb    Alcohol Level: No results found for this basename: ETH:2 in the last 72 hours Urinalysis: No results found for this basename: COLORURINE:2,APPERANCEUR:2,LABSPEC:2,PHURINE:2,GLUCOSEU:2,HGBUR:2,BILIRUBINUR:2,KETONESUR:2,PROTEINUR:2,UROBILINOGEN:2,NITRITE:2,LEUKOCYTESUR:2 in the last 72 hours  Recent Results (from the past 240 hour(s))  URINE CULTURE     Status: Normal   Collection Time   05/05/12  1:40 AM      Component Value Range Status Comment   Specimen Description URINE, CLEAN CATCH   Final    Special Requests NONE   Final    Culture  Setup Time 05/05/2012 20:11   Final    Colony Count 75,000 COLONIES/ML   Final    Culture KLEBSIELLA PNEUMONIAE   Final    Report Status 05/07/2012 FINAL   Final    Organism ID, Bacteria KLEBSIELLA PNEUMONIAE   Final  STOOL CULTURE     Status: Normal (Preliminary result)   Collection Time   05/05/12  4:13 AM      Component Value Range Status Comment   Specimen Description STOOL   Final    Special Requests NONE   Final    Culture NO SUSPICIOUS COLONIES, CONTINUING TO HOLD   Final    Report Status PENDING   Incomplete   CLOSTRIDIUM DIFFICILE BY PCR     Status: Normal    Collection Time   05/05/12  4:13 AM      Component Value Range Status Comment   C difficile by pcr NEGATIVE  NEGATIVE Final   CLOSTRIDIUM DIFFICILE BY PCR     Status: Normal   Collection Time   05/07/12  9:35 AM      Component Value Range Status Comment   C difficile by pcr NEGATIVE  NEGATIVE Final     Studies/Results: No results found.  Medications: Scheduled Meds:    . aspirin  81 mg Oral Daily  . azaTHIOprine  100 mg Oral Daily  . feeding supplement  30 mL Oral BID  . fluticasone  2 spray Each Nare Daily  . folic acid  1 mg Oral Daily  . levothyroxine  100 mcg Oral QAC breakfast  . meperidine      . metoprolol succinate  12.5 mg Oral Daily  . midazolam      . multivitamin with minerals  1 tablet Oral Daily  . predniSONE  10 mg Oral Q breakfast  . predniSONE  20 mg Oral BID WC  . DISCONTD: feeding supplement  237 mL Oral BID BM   Continuous Infusions:    .  sodium bicarbonate infusion 1/4 NS 1000 mL 100 mL/hr at 05/08/12 1129  . DISCONTD: sodium chloride 75 mL/hr at 05/07/12 0543  . DISCONTD:  sodium bicarbonate infusion 1/4 NS 1000 mL     PRN Meds:.acetaminophen, acetaminophen, albuterol, ALPRAZolam, alum & mag hydroxide-simeth, guaiFENesin-dextromethorphan, loperamide, ondansetron (ZOFRAN) IV, ondansetron, DISCONTD: meperidine, DISCONTD: midazolam, DISCONTD: simethicone susp in sterile water 1000 mL irrigation  Assessment/Plan:  Active Problems:  PAF (paroxysmal atrial fibrillation)  Hypothyroidism  Weight loss, unintentional  Muscle weakness (generalized)  Malnutrition/Cachexia  Failure to thrive  Falls  Diarrhea  Orthostatic hypotension  Macrocytosis  Dehydration  Lactic acidosis  History of hip fracture  Depression with anxiety  Inclusion body myositis  Allergic rhinitis  Dysphagia  Hypokalemia  Plan: This lady was admitted to the hospital with profuse diarrhea, and generalized weakness.  She has a history of inclusion body myositis and is  chronically on immunosuppressive therapy.  She has had significant weight loss of a reported 70lbs over the past few years.  She has become very frail and has significant muscle wasting.   1. Persistent diarrhea.  Appreciate GI assistance. Stool C diff was found to be negative.  She had a sigmoidoscopy to rule out colitis and this was found to be unremarkable. Biopsy results are pending.  The thought is that this may be a secretory diarrhea.  She is currently undergoing a 24hour stool collection.  She has a rectal tube in place.   2. Heme positive stools.  Patient had been complaining of black stools.  In the hospital, stools were found to be brown in color, but were noted to be heme positive. This could've been related to recent anticoagulation.  She has not had any gross signs of bleeding. GI following.  3. Hypothyroidism.  Patient reports she has been  compliant with her medications.  TSH is elevated. Increase daily dose of levothyroxine. Repeat thyroid function tests in 4 weeks  4. 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  Started on prednisone during this admission with the hopes of stimulating appetite and slowing progression of disease. I had a long discussion with the patient and her son regarding progression of her disease and prognosis.  It does not appear that they truly understand how this disease is affecting her.  I have urged them to starting thinking about quality of life issues and what to expect as her disease progresses. They wish to focus on her current GI illness at this time. I agree with Dr. Phillips Odor that her longterm prognosis appears poor and that she would likely be a candidate for hospice.  It does not appear that the patient nor the family are ready for this.   5. Paroxysmal a fib.  Patient is currently in sinus rhythm.  She was felt to be a high fall risk, is very debilitated, and not a good candidate for chronic anticoagulation.  Xarelto has been discontinued. She  is now on aspirin  6. Metabolic acidosis.  Likely secondary to GI losses with persistent diarrhea.  Continue bicarb infusion.  7. Dispo.  Patient's son does not feel he can care for her at home.  Per physical therapy, she would be a candidate for SNF.  CSW is following.  Time spent coordinating care:   LOS: 4 days   Braxton Vantrease [facility] Pager: [phone] 05/08/2012, 3:18 PM

## 2012-05-08 NOTE — Progress Notes (Addendum)
Nutrition Follow-up  Intervention:   Recommend ST follow up for possible swallow difficulty due to coughing with eating Add ProStat 30 ml BID  Assessment:   Pt s/p Flexible sigmoidoscopy yesterday evening. Currently eating breakfast. Good po intake. Doesn't like Ensure but is amiable to take ProStat. Discussed importance of adequate protein-energy intake. She c/o coughing when she eats and reports hx of "problems with esophagus". No diarrhea at this time.  Diet Order: Low Fiber- po's 75% meals  Meds: Scheduled Meds:   . aspirin  81 mg Oral Daily  . azaTHIOprine  100 mg Oral Daily  . feeding supplement  237 mL Oral BID BM  . fluticasone  2 spray Each Nare Daily  . folic acid  1 mg Oral Daily  . levothyroxine  100 mcg Oral QAC breakfast  . meperidine      . metoprolol succinate  12.5 mg Oral Daily  . midazolam      . multivitamin with minerals  1 tablet Oral Daily  . predniSONE  10 mg Oral Q breakfast  . predniSONE  20 mg Oral BID WC  . sodium phosphate  2 enema Rectal Once   Continuous Infusions:   .  sodium bicarbonate infusion 1/4 NS 1000 mL 100 mL/hr at 05/07/12 1826  . DISCONTD: sodium chloride 75 mL/hr at 05/07/12 0543  . DISCONTD:  sodium bicarbonate infusion 1/4 NS 1000 mL     PRN Meds:.acetaminophen, acetaminophen, albuterol, ALPRAZolam, alum & mag hydroxide-simeth, loperamide, ondansetron (ZOFRAN) IV, ondansetron, DISCONTD: meperidine, DISCONTD: midazolam, DISCONTD: simethicone susp in sterile water 1000 mL irrigation   CMP     Component Value Date/Time   NA 134* 05/08/2012 0512   K 3.6 05/08/2012 0512   CL 107 05/08/2012 0512   CO2 17* 05/08/2012 0512   GLUCOSE 99 05/08/2012 0512   BUN 14 05/08/2012 0512   CREATININE 0.64 05/08/2012 0512   CALCIUM 8.2* 05/08/2012 0512   PROT 7.4 05/04/2012 2145   ALBUMIN 3.5 05/04/2012 2145   AST 23 05/04/2012 2145   ALT 10 05/04/2012 2145   ALKPHOS 88 05/04/2012 2145   BILITOT 0.5 05/04/2012 2145   GFRNONAA 80* 05/08/2012 0512   GFRAA >90 05/08/2012 0512    CBG (last 3)  No results found for this basename: GLUCAP:3 in the last 72 hours   Intake/Output Summary (Last 24 hours) at 05/08/12 1027 Last data filed at 05/07/12 1700  Gross per 24 hour  Intake 1207.5 ml  Output      0 ml  Net 1207.5 ml    Weight Status:  No new wt   Estimated Nutritional Needs:  Kcal:1320-1540 kcal  Protein:62-75 gr  Fluid:1 ml/kcal   NUTRITION DIAGNOSIS:  -Inadequate oral intake (NI-2.1). Status: Ongoing   MONITORING/EVALUATION(Goals):  Monitor for plan of care (disposition), SLP eval  Goal: Pt to meet >/= 90% of their estimated nutrition needs; progressing.    #[phone]

## 2012-05-09 DIAGNOSIS — E872 Acidosis: Secondary | ICD-10-CM

## 2012-05-09 DIAGNOSIS — N390 Urinary tract infection, site not specified: Secondary | ICD-10-CM

## 2012-05-09 LAB — STOOL CULTURE

## 2012-05-09 LAB — BASIC METABOLIC PANEL
BUN: 19 mg/dL (ref 6–23)
Chloride: 102 mEq/L (ref 96–112)
GFR calc Af Amer: >90 mL/min (ref >90)
GFR calc non Af Amer: 81 mL/min — ABNORMAL LOW (ref >90)
Glucose, Bld: 114 mg/dL — ABNORMAL HIGH (ref 70–99)
Potassium: 4.2 mEq/L (ref 3.5–5.1)
Sodium: 134 mEq/L — ABNORMAL LOW (ref 135–145)

## 2012-05-09 LAB — CBC
HCT: 27.6 % — ABNORMAL LOW (ref 36.0–46.0)
Hemoglobin: 9.6 g/dL — ABNORMAL LOW (ref 12.0–15.0)
RDW: 16 % — ABNORMAL HIGH (ref 11.5–15.5)
WBC: 4 10*3/uL (ref 4.0–10.5)

## 2012-05-09 MED ORDER — ALUM & MAG HYDROXIDE-SIMETH 200-200-20 MG/5ML PO SUSP
ORAL | Status: AC
  Filled 2012-05-09: qty 30

## 2012-05-09 MED ORDER — STERILE WATER FOR INJECTION IV SOLN
INTRAVENOUS | Status: DC
  Filled 2012-05-09 (×3): qty 9.7

## 2012-05-09 MED ORDER — CIPROFLOXACIN HCL 250 MG PO TABS
250.0000 mg | ORAL_TABLET | Freq: Two times a day (BID) | ORAL | Status: DC
  Administered 2012-05-09 – 2012-05-10 (×3): 250 mg via ORAL
  Filled 2012-05-09 (×2): qty 1

## 2012-05-09 MED ORDER — CIPROFLOXACIN HCL 250 MG PO TABS
ORAL_TABLET | ORAL | Status: AC
  Administered 2012-05-09: 250 mg via ORAL
  Filled 2012-05-09: qty 1

## 2012-05-09 MED ORDER — ALPRAZOLAM 0.5 MG PO TABS
ORAL_TABLET | ORAL | Status: AC
  Filled 2012-05-09: qty 1

## 2012-05-09 MED ORDER — ACETAMINOPHEN 325 MG PO TABS
ORAL_TABLET | ORAL | Status: AC
  Filled 2012-05-09: qty 2

## 2012-05-09 MED ORDER — GUAIFENESIN-DM 100-10 MG/5ML PO SYRP
ORAL_SOLUTION | ORAL | Status: AC
  Administered 2012-05-09: 5 mL via ORAL
  Filled 2012-05-09: qty 5

## 2012-05-09 MED ORDER — PREDNISONE 20 MG PO TABS
ORAL_TABLET | ORAL | Status: AC
  Filled 2012-05-09: qty 1

## 2012-05-09 NOTE — Progress Notes (Signed)
Subjective: The patient is sitting up in bed. Her only complaint is that the IV apparatus keeps beeping. Her diarrhea has slowed significantly.  Objective: Vital signs in last 24 hours: Filed Vitals:   05/08/12 1003 05/08/12 1357 05/08/12 2158 05/09/12 0907  BP: 115/70 91/57 97/71    Pulse: 103 105 99 105  Temp:  98.4 F (36.9 C) 97.8 F (36.6 C)   TempSrc:  Oral Oral   Resp:  16 20   Height:      Weight:      SpO2:  100% 98% 98%    Intake/Output Summary (Last 24 hours) at 05/09/12 1216 Last data filed at 05/09/12 0915  Gross per 24 hour  Intake    360 ml  Output      2 ml  Net    358 ml    Weight change:   Physical exam: General: Pleasant alert very thin 78 year old Caucasian woman sitting up in bed in no acute distress. Lungs: Decreased breath sounds in the bases and otherwise clear. Heart: S1, S2, with borderline tachycardia. Abdomen: Positive bowel sounds, soft, nontender, nondistended. Extremities: No pedal edema.  Lab Results: Basic Metabolic Panel:  Basename 05/09/12 0531 05/08/12 0512  NA 134* 134*  K 4.2 3.6  CL 102 107  CO2 26 17*  GLUCOSE 114* 99  BUN 19 14  CREATININE 0.61 0.64  CALCIUM 8.1* 8.2*  MG -- --  PHOS -- --   Liver Function Tests: No results found for this basename: AST:2,ALT:2,ALKPHOS:2,BILITOT:2,PROT:2,ALBUMIN:2 in the last 72 hours No results found for this basename: LIPASE:2,AMYLASE:2 in the last 72 hours No results found for this basename: AMMONIA:2 in the last 72 hours CBC:  Basename 05/09/12 0531 05/07/12 0546  WBC 4.0 5.6  NEUTROABS -- --  HGB 9.6* 8.9*  HCT 27.6* 25.7*  MCV 104.2* 107.1*  PLT 194 181   Cardiac Enzymes: No results found for this basename: CKTOTAL:3,CKMB:3,CKMBINDEX:3,TROPONINI:3 in the last 72 hours BNP: No results found for this basename: PROBNP:3 in the last 72 hours D-Dimer: No results found for this basename: DDIMER:2 in the last 72 hours CBG: No results found for this basename: GLUCAP:6 in the  last 72 hours Hemoglobin A1C: No results found for this basename: HGBA1C in the last 72 hours Fasting Lipid Panel: No results found for this basename: CHOL,HDL,LDLCALC,TRIG,CHOLHDL,LDLDIRECT in the last 72 hours Thyroid Function Tests: No results found for this basename: TSH,T4TOTAL,FREET4,T3FREE,THYROIDAB in the last 72 hours Anemia Panel: No results found for this basename: VITAMINB12,FOLATE,FERRITIN,TIBC,IRON,RETICCTPCT in the last 72 hours Coagulation: No results found for this basename: LABPROT:2,INR:2 in the last 72 hours Urine Drug Screen: Drugs of Abuse  No results found for this basename: labopia, cocainscrnur, labbenz, amphetmu, thcu, labbarb    Alcohol Level: No results found for this basename: ETH:2 in the last 72 hours Urinalysis: No results found for this basename: COLORURINE:2,APPERANCEUR:2,LABSPEC:2,PHURINE:2,GLUCOSEU:2,HGBUR:2,BILIRUBINUR:2,KETONESUR:2,PROTEINUR:2,UROBILINOGEN:2,NITRITE:2,LEUKOCYTESUR:2 in the last 72 hours Misc. Labs:   Micro: Recent Results (from the past 240 hour(s))  URINE CULTURE     Status: Normal   Collection Time   05/05/12  1:40 AM      Component Value Range Status Comment   Specimen Description URINE, CLEAN CATCH   Final    Special Requests NONE   Final    Culture  Setup Time 05/05/2012 20:11   Final    Colony Count 75,000 COLONIES/ML   Final    Culture KLEBSIELLA PNEUMONIAE   Final    Report Status 05/07/2012 FINAL   Final    Organism ID,  Bacteria KLEBSIELLA PNEUMONIAE   Final   STOOL CULTURE     Status: Normal (Preliminary result)   Collection Time   05/05/12  4:13 AM      Component Value Range Status Comment   Specimen Description STOOL   Final    Special Requests NONE   Final    Culture NO SUSPICIOUS COLONIES, CONTINUING TO HOLD   Final    Report Status PENDING   Incomplete   CLOSTRIDIUM DIFFICILE BY PCR     Status: Normal   Collection Time   05/05/12  4:13 AM      Component Value Range Status Comment   C difficile by pcr  NEGATIVE  NEGATIVE Final   CLOSTRIDIUM DIFFICILE BY PCR     Status: Normal   Collection Time   05/07/12  9:35 AM      Component Value Range Status Comment   C difficile by pcr NEGATIVE  NEGATIVE Final     Studies/Results: No results found.  Medications:  Scheduled:   . aspirin  81 mg Oral Daily  . azaTHIOprine  100 mg Oral Daily  . feeding supplement  30 mL Oral BID  . fluticasone  2 spray Each Nare Daily  . folic acid  1 mg Oral Daily  . levothyroxine  100 mcg Oral QAC breakfast  . metoprolol succinate  12.5 mg Oral Daily  . multivitamin with minerals  1 tablet Oral Daily  . predniSONE  10 mg Oral Q breakfast  . predniSONE  20 mg Oral BID WC   Continuous:   .  sodium bicarbonate infusion 1/4 NS 1000 mL 100 mL/hr at 05/09/12 8657   QIO:[id number], acetaminophen, albuterol, ALPRAZolam, alum & mag hydroxide-simeth, guaiFENesin-dextromethorphan, loperamide, ondansetron (ZOFRAN) IV, ondansetron  Assessment: Active Problems:  PAF (paroxysmal atrial fibrillation)  Hypothyroidism  Weight loss, unintentional  Muscle weakness (generalized)  Malnutrition/Cachexia  Failure to thrive  Falls  Diarrhea  Orthostatic hypotension  Macrocytosis  Dehydration  Lactic acidosis  History of hip fracture  Depression with anxiety  Inclusion body myositis  Allergic rhinitis  Dysphagia  Hypokalemia   1. Persistent Diarrhea. Apparently, her diarrhea is resolving. I discussed her with Dr. Karilyn Cota. He believes that she may have had an infectious diarrhea that is improving with supportive treatment. Sigmoidoscopy was unremarkable. C. difficile toxin was negative. The rectal tube has been discontinued and a 24-hour stool collection has been canceled by Dr. Dionicia Abler as the patient's diarrhea is resolving.  Klebsiella urinary tract infection. Cipro will be started.  Metabolic acidosis. Likely secondary to GI losses with persistent diarrhea. Now resolved, status post bicarbonate  infusion.  Inclusion body myositis. We'll continue Imuran. We'll continue prednisone as started by Dr. Kerry Hough in hopes to improve her appetite and treat the underlying condition.  Chronic paroxysmal atrial fibrillation. In sinus rhythm by my exam. She is on aspirin as anticoagulation has been discontinued due to to her high fall risk and debilitated state.  Heme positive stools/anemia secondary to chronic disease and perhaps chronic GI blood losses. We'll defer further workup to Dr. Dionicia Abler.  Plan:  1. Decrease the rate of the IV fluids. 2. Start Cipro 250 mg twice a day for Klebsiella urinary tract infection. 3. Dr. Karilyn Cota plans to order stool ova and parasite. We'll await the biopsy from the sigmoidoscopy. 4. Plan discharge over the next 24-48 hours to skilled nursing facility for short-term rehabilitation.    LOS: 5 days   Krimson Massmann 05/09/2012, 12:16 PM

## 2012-05-09 NOTE — Progress Notes (Signed)
Subjective; Patient has no complaints. She believes she ate 50% of her  Breakfast. She denies nausea vomiting or abdominal pain. He hasn't had a bowel movement since rectal tube was removed last evening. Objective; BP 97/71  Pulse 105  Temp 97.8 F (36.6 C) (Oral)  Resp 20  Ht 5\' 5"  (1.651 m)  Wt 97 lb (44 kg)  BMI 16.14 kg/m2  SpO2 98% Patient is alert and in no acute distress. Her abdomen is soft and nontender without organomegaly or masses. Lab data; WBC 4.0, H&H 9.6 and 27.6 platelet count 194K. Serum sodium 134, potassium 4.2, chloride 102, CO2 26, glucose 114, BUN 19 and creatinine 0.61 Stool culture remains negative. Assessment; Acute diarrhea has resolved. She also G. and most likely an infection or toxic enteropathy despite negative stool studies which are not complete. Weight loss remains of concern. It may be related to underlying myopathy. It would be worthwhile to do calorie count at nursing facility. Anemia possibly of chronic disease. Recommendations; Stool for O&P to complete her workup. Await results of colonic biopsy

## 2012-05-10 ENCOUNTER — Encounter (HOSPITAL_COMMUNITY): Payer: Self-pay | Admitting: Internal Medicine

## 2012-05-10 DIAGNOSIS — R5381 Other malaise: Secondary | ICD-10-CM | POA: Diagnosis present

## 2012-05-10 LAB — BASIC METABOLIC PANEL WITH GFR
BUN: 22 mg/dL (ref 6–23)
CO2: 27 meq/L (ref 19–32)
Calcium: 8.1 mg/dL — ABNORMAL LOW (ref 8.4–10.5)
Chloride: 99 meq/L (ref 96–112)
Creatinine, Ser: 0.69 mg/dL (ref 0.50–1.10)
GFR calc Af Amer: >90 mL/min
GFR calc non Af Amer: 78 mL/min — ABNORMAL LOW
Glucose, Bld: 76 mg/dL (ref 70–99)
Potassium: 4.1 meq/L (ref 3.5–5.1)
Sodium: 132 meq/L — ABNORMAL LOW (ref 135–145)

## 2012-05-10 MED ORDER — SODIUM CHLORIDE 0.9 % IV SOLN: INTRAVENOUS | Status: DC

## 2012-05-10 MED ORDER — FOLIC ACID 1 MG PO TABS: 1.0000 mg | ORAL_TABLET | Freq: Every day | ORAL | Status: AC

## 2012-05-10 MED ORDER — CIPROFLOXACIN HCL 250 MG PO TABS: 250.0000 mg | ORAL_TABLET | Freq: Two times a day (BID) | ORAL | Status: DC

## 2012-05-10 MED ORDER — PREDNISONE 10 MG PO TABS: 10.0000 mg | ORAL_TABLET | Freq: Every day | ORAL | Status: AC

## 2012-05-10 MED ORDER — ALPRAZOLAM 0.5 MG PO TABS: 0.5000 mg | ORAL_TABLET | Freq: Two times a day (BID) | ORAL | Status: DC | PRN

## 2012-05-10 MED ORDER — METRONIDAZOLE IVPB CUSTOM
250.0000 mg | Freq: Three times a day (TID) | INTRAVENOUS | Status: DC
  Filled 2012-05-10 (×4): qty 50

## 2012-05-10 MED ORDER — LEVOTHYROXINE SODIUM 100 MCG PO TABS: 100.0000 ug | ORAL_TABLET | Freq: Every day | ORAL | Status: DC

## 2012-05-10 MED ORDER — METRONIDAZOLE IN NACL 5-0.79 MG/ML-% IV SOLN: 500.0000 mg | Freq: Three times a day (TID) | INTRAVENOUS | Status: DC

## 2012-05-10 MED ORDER — ADULT MULTIVITAMIN W/MINERALS CH: 1.0000 | ORAL_TABLET | Freq: Every day | ORAL | Status: AC

## 2012-05-10 MED ORDER — LOPERAMIDE HCL 2 MG PO CAPS: 2.0000 mg | ORAL_CAPSULE | ORAL | Status: DC | PRN

## 2012-05-10 MED ORDER — PRO-STAT SUGAR FREE PO LIQD: 30.0000 mL | Freq: Two times a day (BID) | ORAL | Status: AC

## 2012-05-10 MED ORDER — PREDNISONE 10 MG PO TABS: 10.0000 mg | ORAL_TABLET | Freq: Two times a day (BID) | ORAL | Status: DC

## 2012-05-10 NOTE — Discharge Summary (Signed)
Physician Discharge Summary  [name] WUJ:[id number] DOB: [birth year] DOA: 05/04/2012  PCP: Herb Grays, MD  Admit date: 05/04/2012 Discharge date: 05/10/2012  Time spent: Greater than 30 minutes  Recommendations for Outpatient Follow-up:  1. The patient was discharged to [name] skilled nursing facility. She will need to followup with gastroenterologist, Dr. Karilyn Cota in 3 weeks.  Discharge Diagnoses:   1. New-onset persistent diarrhea. Etiology not clearly elucidated; possible infectious in origin, but C. difficile PCR was negative. Final stool culture pending at the time of discharge. Ova and parasite study pending at the time of discharge. Biopsy results from sigmoidoscopy pending at the time of discharge. 2. Lactic acidosis/metabolic acidosis secondary to diarrhea. Resolved, following resolution of diarrhea and bicarbonate drip. 3. Orthostatic hypotension, resolved with IV fluid hydration. 4. Hypokalemia, repleted. 5. Mild hyponatremia with dehydration. 6. Klebsiella urinary tract infection. 7. Severe malnutrition/cachexia/unintentional weight loss. 8. Generalized muscle weakness/physical deconditioning. 9. Macrocytosis with anemia. Vitamin B12 level was within normal limits. 10. Mild dysphagia. Dysphagia 3 diet with thin liquids recommended. 11. Hypothyroidism. TSH was elevated, and therefore, the dose of Synthroid was increased. A followup TSH/free T4 is needed in 4 weeks. 12. Generalized failure to thrive. 13. Paroxysmal atrial fibrillation. On antiplatelet therapy. Anticoagulation discontinued due to debilitation and risk of falls. 14. History of hip fracture. 15. Depression with anxiety. 16. Inclusion body myositis. Prednisone was started in an attempt to improve her appetite and to decrease muscle inflammation.  Discharge Condition: Stable.  Diet recommendation: Dysphagia 3 with thin liquids.  Filed Weights   05/04/12 2059 05/05/12 0121  Weight: 37.195 kg (82 lb) 44 kg  (97 lb)    History of present illness:  The patient is an 78 year old woman with a history significant for inclusion body myositis, cachexia, paroxysmal atrial fibrillation, and multiple falls with hip fractures, who presented to the emergency department on 05/05/2012 with a chief complaint of diarrhea and generalized weakness. In the emergency department, she was afebrile and hemodynamically stable, but orthostatic. Her lab data were significant for a serum sodium of 132, glucose of 102, BUN of 26, normal liver transaminases, normal WBC, normal lipase, normal troponin I., and an MCV of 107. Her chest x-ray revealed no acute findings. Her abdominal x-ray revealed no acute findings. Her EKG revealed normal sinus rhythm with artifact. She was admitted for further evaluation and management.  Hospital Course:  The patient was started on IV fluid hydration. She was given multivitamins in the IV fluids initially. The vitamins were eventually transitioned to the oral route. Oral prednisone was started as an appetite stimulant and also for treatment of IBM. Azathioprine was continued. Nutritional supplements were given with Ensure between meals. Rivaroxiban was discontinued for anticoagulation of paroxysmal atrial fibrillation in the setting of cachexia, malnutrition, and a very high fall risk. Aspirin was started instead. She was maintained on metoprolol for rate control. She was initially started on IV Flagyl, but this was discontinued pending further studies.  For further evaluation, a number of studies were ordered. Her lactic acid was 3.3. Her followup bicarbonate level fell to 16. Therefore, bicarbonate was added to her IV fluids. Following bicarbonate infusion, her CO2 level normalized to 27 prior to discharge. Her sedimentation rate was modestly elevated at 52. Her vitamin B12 level was within normal limits at 456. Her folate level was greater than 20. Her total iron was low at 18. Her TSH was elevated at  11.88, but her free T4 was within normal limits at 1.64. A decision was  made to increase her Synthroid dosing from 75 mcg to 100 mcg daily. C. difficile PCR was negative x2. Fecal occult blood was positive. Her urine culture became positive for Klebsiella. She was therefore started on Cipro on 05/09/2012.  Gastroenterologist, Dr. Karilyn Cota was consulted for further evaluation. Because of the profuse diarrhea, a rectal tube was inserted. Dr. Karilyn Cota proceeded with a flexible sigmoidoscopy following this discussion with the patient and her children. The plan was to assess or rule out colitis or evidence of a secretory diarrhea. The results of the flexible sigmoidoscopy were unremarkable. However, random biopsies were taken of the sigmoid mucosa looking for microscopic or eosinophilic colitis. The results were pending at the time of discharge. Dr. Karilyn Cota proceeded with ordering a 24-hour stool collection for electrolytes and osmolality. Although, the patient had diarrhea totaling 1500 cc during the first couple days of the hospitalization, at the onset of the study, the patient only put out 85 cc as the diarrheal stools were slowing significantly. Therefore, the 24-hour stool collection was discontinued. Ova and parasite study was also ordered, but the results were pending at the time of discharge.   The patient was evaluated by the registered dietitian, physical therapist, and speech therapist. She was deemed to have severe malnutrition with unintentional weight loss. Supplements were started accordingly. The speech therapist recommended a dysphagia 3 diet with thin liquids as her dysphagia was mild. The physical therapist recommended skilled nursing facility placement for severe deconditioning and muscle weakness. She and the family agreed. Therefore, the patient was discharged to [name] skilled nursing facility for short term rehabilitation.   The patient was tapered slightly on prednisone for inclusion body  myositis. She will be continued on 10 mg of prednisone daily if not indefinitely. My colleague, Dr. Kerry Hough, informed the patient and family that her condition carries a long-term poor prognosis and that it is likely causing a relatively poor quality of life. CODE STATUS was discussed and the patient was made a partial DO NOT RESUSCITATE (no CPR, no intubation, no to defibrillation, yes to antiarrhythmic drugs, and yes to vasoactive drugs).   In my discussion with Dr. Karilyn Cota, gastroenterologist, prior to discharge, he will followup with the results of all of the stool studies and biopsies pending and will notify the patient and/or her family of the results. He recommended continued Cipro and Flagyl empirically (at smaller doses) over the next 7 days.   Procedures:  Flexible sigmoidoscopy  Consultations:  Gastroenterologist, Lionel December, M.D.  Discharge Exam: Filed Vitals:   05/09/12 1339 05/09/12 2100 05/10/12 0526 05/10/12 0843  BP: 100/62 110/73 111/60   Pulse: 103 98 85 101  Temp: 98 F (36.7 C) 98.5 F (36.9 C) 98.6 F (37 C)   TempSrc: Oral Oral Oral   Resp: 20 20 20    Height:      Weight:      SpO2: 98% 98% 93% 99%    General: Small framed, underweight 78 year old Caucasian woman sitting up in bed, in no acute distress. Cardiovascular: S1, S2, with occasional ectopy versus irregular, irregular. Abdomen: Positive bowel sounds, soft, nontender, nondistended. Respiratory: Clear to auscultation bilaterally.  Discharge Instructions  Discharge Orders    Future Appointments: Provider: Department: Dept Phone: Center:   05/29/2012 4:15 PM Cassell Clement, MD [facility] Main Office [place]) [phone] [id number]     Future Orders Please Complete By Expires   Diet - low sodium heart healthy      Increase activity slowly      Discharge instructions  Comments:   The patient will need a followup consultation with nutritionist at the nursing facility. Consider  calorie count. An appointment should be scheduled for the patient to followup with gastroenterologist Dr. Karilyn Cota in 2-3 weeks.       Medication List     As of 05/10/2012 11:30 AM    STOP taking these medications         traMADol 50 MG tablet   Commonly known as: ULTRAM      Vitamin D (Ergocalciferol) 50000 UNITS Caps   Commonly known as: DRISDOL      TAKE these medications         albuterol 108 (90 BASE) MCG/ACT inhaler   Commonly known as: PROVENTIL HFA;VENTOLIN HFA   Inhale 2 puffs into the lungs every 6 (six) hours as needed. Shortness of breath      ALPRAZolam 0.5 MG tablet   Commonly known as: XANAX   Take 1 tablet (0.5 mg total) by mouth 2 (two) times daily as needed for sleep or anxiety. anxiety      aspirin 81 MG chewable tablet   Chew 81 mg by mouth daily.      azaTHIOprine 50 MG tablet   Commonly known as: IMURAN   Take 100 mg by mouth daily. Takes 2 tablets As directed      ciprofloxacin 250 MG tablet   Commonly known as: CIPRO   Take 1 tablet (250 mg total) by mouth 2 (two) times daily. Take for 7 more days.      feeding supplement Liqd   Take 30 mLs by mouth 2 (two) times daily.      fluticasone 50 MCG/ACT nasal spray   Commonly known as: FLONASE   Place 2 sprays into the nose daily.      folic acid 1 MG tablet   Commonly known as: FOLVITE   Take 1 tablet (1 mg total) by mouth daily.      levothyroxine 100 MCG tablet   Commonly known as: SYNTHROID, LEVOTHROID   Take 1 tablet (100 mcg total) by mouth daily.      loperamide 2 MG capsule   Commonly known as: IMODIUM   Take 1 capsule (2 mg total) by mouth as needed for diarrhea or loose stools (Give after each loose BM, not to exceed eight capsules (16mg ) in 24hr).      metoprolol tartrate 25 MG tablet   Commonly known as: LOPRESSOR   Take 12.5 mg by mouth every morning.      multivitamin with minerals Tabs   Take 1 tablet by mouth daily.      predniSONE 10 MG tablet   Commonly known as:  DELTASONE   Take 1 tablet (10 mg total) by mouth daily with breakfast.           Follow-up Information    Follow up with REHMAN,NAJEEB U, MD. Schedule an appointment as soon as possible for a visit in 3 weeks.   Contact information:   [address], [address] [place] [address] [phone]           The results of significant diagnostics from this hospitalization (including imaging, microbiology, ancillary and laboratory) are listed below for reference.    Significant Diagnostic Studies: Dg Abd Acute W/chest  05/04/2012  *RADIOLOGY REPORT*  Clinical Data: Failure to thrive.  ACUTE ABDOMEN SERIES (ABDOMEN 2 VIEW & CHEST 1 VIEW)  Comparison: Plain film of the chest 11/02/2010.  Findings: Single view of the chest demonstrates  pulmonary hyperexpansion and attenuation of the pulmonary vasculature suggesting emphysema.  The lungs are clear.  Heart size is normal.  Two views of the abdomen show no free intraperitoneal air.  The bowel gas pattern is unremarkable.  Bilateral hip replacements and convex left scoliosis are noted.  IMPRESSION: No acute finding chest or abdomen.   Original Report Authenticated By: Bernadene Bell. Maricela Curet, M.D.     Microbiology: Recent Results (from the past 240 hour(s))  URINE CULTURE     Status: Normal   Collection Time   05/05/12  1:40 AM      Component Value Range Status Comment   Specimen Description URINE, CLEAN CATCH   Final    Special Requests NONE   Final    Culture  Setup Time 05/05/2012 20:11   Final    Colony Count 75,000 COLONIES/ML   Final    Culture KLEBSIELLA PNEUMONIAE   Final    Report Status 05/07/2012 FINAL   Final    Organism ID, Bacteria KLEBSIELLA PNEUMONIAE   Final   STOOL CULTURE     Status: Normal   Collection Time   05/05/12  4:13 AM      Component Value Range Status Comment   Specimen Description STOOL   Final    Special Requests NONE   Final    Culture     Final    Value: NO SALMONELLA, SHIGELLA, CAMPYLOBACTER, YERSINIA, OR  E.COLI 0157:H7 ISOLATED   Report Status 05/09/2012 FINAL   Final   CLOSTRIDIUM DIFFICILE BY PCR     Status: Normal   Collection Time   05/05/12  4:13 AM      Component Value Range Status Comment   C difficile by pcr NEGATIVE  NEGATIVE Final   CLOSTRIDIUM DIFFICILE BY PCR     Status: Normal   Collection Time   05/07/12  9:35 AM      Component Value Range Status Comment   C difficile by pcr NEGATIVE  NEGATIVE Final      Labs: Basic Metabolic Panel:  Lab 05/10/12 4098 05/09/12 0531 05/08/12 0512 05/07/12 0546 05/06/12 0503 05/05/12 0030  NA 132* 134* 134* 135 140 --  K 4.1 4.2 3.6 3.5 4.6 --  CL 99 102 107 109 113* --  CO2 27 26 17* 16* 18* --  GLUCOSE 76 114* 99 110* 97 --  BUN 22 19 14 17 20  --  CREATININE 0.69 0.61 0.64 0.64 0.73 --  CALCIUM 8.1* 8.1* 8.2* 9.0 9.0 --  MG -- -- -- -- -- 2.0  PHOS -- -- -- -- -- 2.6   Liver Function Tests:  Lab 05/04/12 2145  AST 23  ALT 10  ALKPHOS 88  BILITOT 0.5  PROT 7.4  ALBUMIN 3.5    Lab 05/04/12 2145  LIPASE 38  AMYLASE --   No results found for this basename: AMMONIA:5 in the last 168 hours CBC:  Lab 05/09/12 0531 05/07/12 0546 05/06/12 0503 05/05/12 0504 05/04/12 2145  WBC 4.0 5.6 5.2 3.6* 5.3  NEUTROABS -- -- -- -- 4.5  HGB 9.6* 8.9* 9.4* 9.8* 12.2  HCT 27.6* 25.7* 28.0* 28.5* 35.7*  MCV 104.2* 107.1* 108.5* 105.6* 106.9*  PLT 194 181 196 237 269   Cardiac Enzymes:  Lab 05/05/12 0030 05/04/12 2145  CKTOTAL 54 --  CKMB -- --  CKMBINDEX -- --  TROPONINI -- <0.30   BNP: BNP (last 3 results) No results found for this basename: PROBNP:3 in the last 8760 hours CBG: No results  found for this basename: GLUCAP:5 in the last 168 hours     Signed:  Vennela Jutte  [facility] 05/10/2012, 11:30 AM

## 2012-05-10 NOTE — Progress Notes (Signed)
Pt d/c via stretcher to [name] via [employer]. Currently pt voices no c/o pain or discomfort. Report to [employer] unit A4

## 2012-05-15 ENCOUNTER — Other Ambulatory Visit (HOSPITAL_COMMUNITY): Payer: Self-pay | Admitting: Internal Medicine

## 2012-05-15 DIAGNOSIS — R1310 Dysphagia, unspecified: Secondary | ICD-10-CM

## 2012-05-19 ENCOUNTER — Other Ambulatory Visit (HOSPITAL_COMMUNITY): Payer: Self-pay | Admitting: Internal Medicine

## 2012-05-19 ENCOUNTER — Ambulatory Visit (HOSPITAL_COMMUNITY)
Admission: RE | Admit: 2012-05-19 | Discharge: 2012-05-19 | Disposition: A | Discharge status: Home / Self Care | Payer: Medicare Other | Source: Ambulatory Visit | Attending: Internal Medicine | Admitting: Internal Medicine

## 2012-05-19 DIAGNOSIS — IMO0001 Reserved for inherently not codable concepts without codable children: Secondary | ICD-10-CM | POA: Insufficient documentation

## 2012-05-19 DIAGNOSIS — R1310 Dysphagia, unspecified: Secondary | ICD-10-CM

## 2012-05-19 NOTE — Procedures (Addendum)
Objective Swallowing Evaluation: Modified Barium Swallowing Study  Patient Details  Name: [name] MRN: [id number] Date of Birth: 10-01-1925  Today's Date: 05/19/2012 Time:  - 1430    Past Medical History:  Past Medical History  Diagnosis Date  . Hypothyroidism   . Atrial fibrillation   . Myositis     BODY  . LVH (left ventricular hypertrophy)     MILD  . Dizziness   . Headache   . Weakness   . Chest pain   . DOE (dyspnea on exertion)   . Poor appetite   . UTI (urinary tract infection)   . Dysphagia     History of dysphagia 2, depressed by esophagus status post dilation  . Arthritis   . Hypokalemia   . History of anxiety   . Orthostatic hypotension   . Inclusion body myositis   . Easy bruising   . Complication of anesthesia     anxiety before anesthesia, wants spinal  . Myalgia and myositis, unspecified 04/30/2011  . Malnutrition/Cachexia 05/05/2012    Pre-morbid body weight 150 lbs, 04/2012 <100 lbs   . Failure to thrive 05/05/2012  . Macrocytosis 05/05/2012  . Diarrhea 05/05/2012   Past Surgical History:  Past Surgical History  Procedure Date  . Total hip arthroplasty 2000 or 2001    right  . Transthoracic echocardiogram 07/08/10    EF 55-60%  . Left total hip arthroplasty 1996  . Total hip revision 12/18/2011    Procedure: TOTAL HIP REVISION;  Surgeon: Loanne Drilling, MD;  Location: [facility];  Service: Orthopedics;  Laterality: Right;  Right Hip Revision to Constrained Liner-acetabular   . Flexible sigmoidoscopy 05/07/2012    Procedure: FLEXIBLE SIGMOIDOSCOPY;  Surgeon: Malissa Hippo, MD;  Location: [facility];  Service: Endoscopy;  Laterality: N/A;   HPI:  Ms. Chandni Dario is an 78 yo woman with a diagnosis of inclusion body myositis who was admitted to [facility] with dehydration and diarrhea on 05/05/2012. She was discharged to [name] for rehab. Pt is very thin and reports diminished appetite. She says that she is tired of eating "mush" and feels like  foods "won't go down".   Symptoms/Limitations Symptoms: Pt seen upright in Hausted chair for MBSS. Special Tests: MBSS  Recommendation/Prognosis  Clinical Impression Dysphagia Diagnosis: Moderate pharyngeal phase dysphagia;Moderate cervical esophageal phase dysphagia Clinical impression: Moderate pharyngeal and cervical esophageal phase dysphagia characterized by delay in swallow initiation with pooling in pyriforms with thins, decreased tongue base retraction and hyolaryngeal excursion, with little to no epiglottic deflection, appearance of cervical osteophytes which did not seem to impede flow, with overall decreased pharyngeal pressure, and poor clearance at CP with suspected Zenker's diverticulum resulting in only one (surprisingly) episode flash penetration of thins via straw sip, significant vallecular and pyriform residue which was reduced with liquid wash and several repeat swallows. Pharynx was never entirely clear despite liquids and repeat swallows. Pt is at risk for aspiration given significant weakness and residuals, however risks can be minimized with use of strategies. Recommend: Dysphagia 3/mech soft (pt hates puree and tolerated regular texture about the same as puree) and thin liquids. Pt should be alert and upright for all po. Alternate consistencies and repeat dry swallows throughout. Pt will likely continue to need supplements due to decreased intake. Swallow Evaluation Recommendations Diet Recommendations: Dysphagia 3 (Mechanical Soft);Thin liquid Liquid Administration via: Cup;Straw Medication Administration: Crushed with puree Supervision: Patient able to self feed;Full supervision/cueing for compensatory strategies Compensations: Slow rate;Small sips/bites;Multiple dry swallows  after each bite/sip;Follow solids with liquid;Effortful swallow Postural Changes and/or Swallow Maneuvers: Out of bed for meals;Seated upright 90 degrees;Upright 30-60 min after meal Oral Care  Recommendations: Oral care BID Other Recommendations: Clarify dietary restrictions Follow up Recommendations: Skilled Nursing facility   Individuals Consulted Consulted and Agree with Results and Recommendations: Patient;Family member/caregiver Family Member Consulted: Pt's son Report Sent to : Facility (Comment);Referring physician  SLP Assessment/Plan Dysphagia Diagnosis: Moderate pharyngeal phase dysphagia;Moderate cervical esophageal phase dysphagia Clinical impression: Moderate pharyngeal and cervical esophageal phase dysphagia characterized by delay in swallow initiation with pooling in pyriforms with thins, decreased tongue base retraction and hyolaryngeal excursion, with little to no epiglottic deflection, appearance of cervical osteophytes which did not seem to impede flow, with overall decreased pharyngeal pressure, and poor clearance at CP with suspected Zenker's diverticulum resulting in only one (surprisingly) episode flash penetration of thins via straw sip, significant vallecular and pyriform residue which was reduced with liquid wash and several repeat swallows. Pharynx was never entirely clear despite liquids and repeat swallows. Pt is at risk for aspiration given significant weakness and residuals, however risks can be minimized with use of strategies. Recommend: Dysphagia 3/mech soft (pt hates puree and tolerated regular texture about the same as puree) and thin liquids. Pt should be alert and upright for all po. Alternate consistencies and repeat dry swallows throughout. Pt will likely continue to need supplements due to decreased intake.  General:  Date of Onset: 05/05/12 HPI: Ms. [name] is an 78 yo woman with a diagnosis of inclusion body myositis who was admitted to [facility] with dehydration and diarrhea on 05/05/2012. She was discharged to [name] for rehab. Pt is very thin and reports diminished appetite. She says that she is tired of eating "mush" and feels like foods "won't  go down".  Type of Study: Modified Barium Swallowing Study Reason for Referral: Objectively evaluate swallowing function Previous Swallow Assessment: BSE 05/05/2012 Diet Prior to this Study: Thin liquids;Dysphagia 1 (puree) Temperature Spikes Noted: No Respiratory Status: Room air History of Recent Intubation: No Behavior/Cognition: Alert;Cooperative;Pleasant mood Oral Cavity - Dentition: Poor condition Oral Motor / Sensory Function: Within functional limits Self-Feeding Abilities: Needs assist Patient Positioning: Upright in chair Baseline Vocal Quality: Clear Volitional Cough: Strong Volitional Swallow: Able to elicit Anatomy: Other (Comment) (osteophytes noted along c-spine) Pharyngeal Secretions: Not observed secondary MBS  Reason for Referral:  Objectively evaluate swallowing function   Oral Phase Oral Preparation/Oral Phase Oral Phase: WFL  Pharyngeal Phase  Pharyngeal Phase Pharyngeal Phase: Impaired Pharyngeal - Thin Pharyngeal - Thin Cup: Delayed swallow initiation;Premature spillage to pyriform sinuses;Reduced pharyngeal peristalsis;Reduced epiglottic inversion;Reduced anterior laryngeal mobility;Reduced tongue base retraction;Pharyngeal residue - valleculae;Pharyngeal residue - pyriform sinuses;Lateral channel residue;Pharyngeal residue - cp segment (residuals mild/mod) Pharyngeal - Thin Straw: Delayed swallow initiation;Premature spillage to pyriform sinuses;Reduced epiglottic inversion;Reduced pharyngeal peristalsis;Reduced anterior laryngeal mobility;Reduced laryngeal elevation;Reduced tongue base retraction;Reduced airway/laryngeal closure;Penetration/Aspiration during swallow;Pharyngeal residue - valleculae;Lateral channel residue;Pharyngeal residue - pyriform sinuses;Pharyngeal residue - cp segment Penetration/Aspiration details (thin straw): Material does not enter airway;Material enters airway, remains ABOVE vocal cords then ejected out Pharyngeal -  Solids Pharyngeal - Puree: Delayed swallow initiation;Premature spillage to valleculae;Reduced pharyngeal peristalsis;Reduced epiglottic inversion;Reduced anterior laryngeal mobility;Reduced tongue base retraction;Reduced laryngeal elevation;Pharyngeal residue - valleculae;Pharyngeal residue - pyriform sinuses;Pharyngeal residue - cp segment (residuals moderate) Pharyngeal - Regular: Delayed swallow initiation;Premature spillage to valleculae;Reduced pharyngeal peristalsis;Reduced epiglottic inversion;Reduced anterior laryngeal mobility;Reduced tongue base retraction;Reduced laryngeal elevation;Pharyngeal residue - valleculae;Pharyngeal residue - pyriform sinuses;Pharyngeal residue - cp segment;Compensatory strategies attempted (Comment) (  alternating with liquids) Pharyngeal Phase - Comment Pharyngeal Comment: Significant pharyngeal weakness, little to no epiglottic deflection  Cervical Esophageal Phase  Cervical Esophageal Phase Cervical Esophageal Phase: Impaired Cervical Esophageal Phase - Thin Thin Cup: Reduced cricopharyngeal relaxation;Other (Comment) (likely Zenker's diverticulum) Cervical Esophageal Phase - Solids Puree: Reduced cricopharyngeal relaxation;Esophageal backflow into the pharynx Regular: Reduced cricopharyngeal relaxation;Esophageal backflow into the pharynx Cervical Esophageal Phase - Comment Cervical Esophageal Comment: Reduced CP relaxation and appearance of small pocket, likely Zenker's diverticulum however no radiologist present to confirm.  GN:  Initial:CK Goal: CJ Discharge: CK  Thank you,  Havery Moros, CCC-SLP [phone]  PORTER,DABNEY 05/19/2012, 7:45 PM

## 2012-05-29 ENCOUNTER — Ambulatory Visit: Payer: Medicare Other | Admitting: Cardiology

## 2012-07-28 ENCOUNTER — Encounter (INDEPENDENT_AMBULATORY_CARE_PROVIDER_SITE_OTHER): Payer: Self-pay | Admitting: Internal Medicine

## 2012-07-28 ENCOUNTER — Ambulatory Visit (INDEPENDENT_AMBULATORY_CARE_PROVIDER_SITE_OTHER): Payer: Medicare Other | Admitting: Internal Medicine

## 2012-07-28 VITALS — BP 96/68 | HR 66 | Temp 98.4°F | Resp 16 | Ht 61.0 in | Wt 114.0 lb

## 2012-07-28 DIAGNOSIS — R110 Nausea: Secondary | ICD-10-CM

## 2012-07-28 DIAGNOSIS — K5900 Constipation, unspecified: Secondary | ICD-10-CM

## 2012-07-28 DIAGNOSIS — D649 Anemia, unspecified: Secondary | ICD-10-CM

## 2012-07-28 DIAGNOSIS — Z87898 Personal history of other specified conditions: Secondary | ICD-10-CM

## 2012-07-28 DIAGNOSIS — Z8719 Personal history of other diseases of the digestive system: Secondary | ICD-10-CM

## 2012-07-28 MED ORDER — ONDANSETRON HCL 4 MG PO TABS: 4.0000 mg | ORAL_TABLET | Freq: Three times a day (TID) | ORAL | Status: AC | PRN

## 2012-07-28 MED ORDER — DOCUSATE SODIUM 100 MG PO CAPS: 100.0000 mg | ORAL_CAPSULE | Freq: Two times a day (BID) | ORAL | Status: DC

## 2012-07-28 NOTE — Patient Instructions (Addendum)
Physician will contact you or family member with results of blood work.

## 2012-07-29 LAB — CBC
HCT: 29.8 % — ABNORMAL LOW (ref 36.0–46.0)
Hemoglobin: 10.3 g/dL — ABNORMAL LOW (ref 12.0–15.0)
MCH: 34.7 pg — ABNORMAL HIGH (ref 26.0–34.0)
MCHC: 34.6 g/dL (ref 30.0–36.0)
RDW: 14.5 % (ref 11.5–15.5)

## 2012-07-29 NOTE — Progress Notes (Signed)
Presenting complaint;  Recent hospitalization for diarrhea and now patient claims of being constipated.  Subjective:  Patient is 79 year old Caucasian female with multiple medical problems who presents for scheduled visit accompanied by her son Onalee Hua. She is admitted to [facility] to its end of October 2013 for copious diarrhea and she responded to empiric antibiotic therapy. Stool studies are negative. Now she presents with constipation. She is having a bowel movement every 3-4 days but she has has to strain each time. She has occasional fleeting midabdominal pain. She denies melena or rectal bleeding. She also denies nausea or vomiting. She says her appetite is improved. She has gained close to 20 pounds since her recent hospitalization. She is presently a nursing home but hoping to go home soon. She is able to ambulate using a walker. She remains with dysphagia. She is on mechanical soft diet. She's been thoroughly evaluated in the past and felt to have motility disorder.   Current Medications: Current Outpatient Prescriptions  Medication Sig Dispense Refill  . acetaminophen (TYLENOL) 325 MG tablet Take 325 mg by mouth every 4 (four) hours as needed. The patient takes 2 tabs by mouth      . albuterol (PROVENTIL HFA;VENTOLIN HFA) 108 (90 BASE) MCG/ACT inhaler Inhale 2 puffs into the lungs every 6 (six) hours as needed. Shortness of breath      . ALPRAZolam (XANAX) 0.5 MG tablet Take 1 tablet (0.5 mg total) by mouth 2 (two) times daily as needed for sleep or anxiety. anxiety  30 tablet  0  . alum & mag hydroxide-simeth (MAALOX PLUS) 400-400-40 MG/5ML suspension Take 5 mLs by mouth as needed.      Marland Kitchen aspirin 81 MG chewable tablet Chew 81 mg by mouth daily.      Marland Kitchen azaTHIOprine (IMURAN) 50 MG tablet Take 100 mg by mouth daily. Takes 2 tablets As directed      . docusate sodium (COLACE) 100 MG capsule Take 1 capsule (100 mg total) by mouth 2 (two) times daily.      . feeding supplement  (PRO-STAT SUGAR FREE 64) LIQD Take 30 mLs by mouth 2 (two) times daily.  900 mL    . fluticasone (FLONASE) 50 MCG/ACT nasal spray Place 2 sprays into the nose daily.  16 g  2  . folic acid (FOLVITE) 1 MG tablet Take 1 tablet (1 mg total) by mouth daily.      Marland Kitchen guaifenesin (ROBITUSSIN) 100 MG/5ML syrup Take 200 mg by mouth every 6 (six) hours as needed.      Marland Kitchen levothyroxine (SYNTHROID, LEVOTHROID) 100 MCG tablet Take 1 tablet (100 mcg total) by mouth daily.      Marland Kitchen loperamide (IMODIUM) 2 MG capsule Take 1 capsule (2 mg total) by mouth as needed for diarrhea or loose stools (Give after each loose BM, not to exceed eight capsules (16mg ) in 24hr).  30 capsule    . metoprolol tartrate (LOPRESSOR) 25 MG tablet Take 12.5 mg by mouth every morning.      . mirtazapine (REMERON) 15 MG tablet Take 15 mg by mouth at bedtime.      . Multiple Vitamin (MULTIVITAMIN WITH MINERALS) TABS Take 1 tablet by mouth daily.      . Nutritional Supplements (RESOURCE 2.0 PO) Take by mouth 2 (two) times daily.      . predniSONE (DELTASONE) 10 MG tablet Take 1 tablet (10 mg total) by mouth daily with breakfast.      . traMADol (ULTRAM) 50 MG tablet  Take 50 mg by mouth 2 (two) times daily.      . ondansetron (ZOFRAN) 4 MG tablet Take 1 tablet (4 mg total) by mouth every 8 (eight) hours as needed for nausea.  30 tablet  1     Objective: Blood pressure 96/68, pulse 66, temperature 98.4 F (36.9 C), temperature source Oral, resp. rate 16, height 5\' 1"  (1.549 m), weight 114 lb (51.71 kg). Patient is alert and in no acute distress. Conjunctiva is pink. Sclera is nonicteric Oropharyngeal mucosa is normal. No neck masses or thyromegaly noted. Abdomen is flat. Bowel sounds are normal. On palpation is soft and nontender without organomegaly or masses to No LE edema or clubbing noted but extremities are thin.  Labs/studies Results: H&H on 05-2012 was 9.6 and 27.6.  Assessment:  Patient is currently having problems with  constipation. She had self-limiting bouts of high volume diarrhea when she was hospitalized about 10 weeks ago and stool studies were negative. She most likely had toxic enterocolitis.  History of anemia possibly of chronic disease. Need to check H&H and WBC since she is on azathioprine.  Malnutrition secondary to chronic illness and myositis. She has gained 20 pounds in the last 10 weeks.  Patient is on promethazine which should be avoided given her age. This issue was discussed with patient's son Onalee Hua.   Plan:  Take Colace 2 tablets by mouth each bedtime. Can use glycerin or Dulcolax suppository on when necessary basis. Discontinue promethazine. Ondansetron 4 mg by mouth every 6 when necessary. Need to make sure she has at least 3 bowel movements per week. Will check CBC today.

## 2012-10-13 ENCOUNTER — Encounter: Payer: Self-pay | Admitting: Obstetrics and Gynecology

## 2012-10-13 ENCOUNTER — Ambulatory Visit (INDEPENDENT_AMBULATORY_CARE_PROVIDER_SITE_OTHER): Payer: Medicare Other | Admitting: Obstetrics and Gynecology

## 2012-10-13 VITALS — BP 90/80 | Ht 64.0 in

## 2012-10-13 DIAGNOSIS — N898 Other specified noninflammatory disorders of vagina: Secondary | ICD-10-CM

## 2012-10-13 DIAGNOSIS — D494 Neoplasm of unspecified behavior of bladder: Secondary | ICD-10-CM

## 2012-10-14 ENCOUNTER — Encounter: Payer: Self-pay | Admitting: Obstetrics and Gynecology

## 2012-10-14 DIAGNOSIS — D494 Neoplasm of unspecified behavior of bladder: Secondary | ICD-10-CM | POA: Insufficient documentation

## 2012-10-14 NOTE — Patient Instructions (Signed)
A dictation copy of today's visit will be forwarded to [name] to assist with referral to Urology, likely Dr Jerre Simon.

## 2012-10-14 NOTE — Progress Notes (Signed)
  Assessment:  Bladder tumor, right side, bladder dome   Plan:  Recommend referral to urology  Subjective:  [name] is a 79 y.o. female, No obstetric history on file., who presents for urgent consult for suspected vaginal bleeding. She is referred from [name] nursing home where she is a resident. She began to experience bleeding 2 days prior to evaluation. She's not bleeding at present on examination the bleeding is not from the vagina but on catheterized urine there is gross hematuria  The following portions of the patient's history were reviewed and updated as appropriate: allergies, current medications, past medical & surgical history, & past family history.    Review of Systems Pertinent items are noted in HPI.  GYN: No LMP recorded. Patient has had a hysterectomy. patient had forgotten that she had undergone prior history, but records from 2001 confirm this history  Objective:  BP 90/80  Ht 5\' 4"  (1.626 m)    BMI: There is no weight on file to calculate BMI.  General Appearance: Alert, appropriate appearance for age. No acute distress HEENT: Grossly normal Gastrointestinal: Soft, non-tender, no masses or organomegaly Pelvic Exam: Vulva and vagina appear normal. Bimanual exam reveals normal uterus and adnexa. External genitalia: normal general appearance Urinary system: urethral meatus normal and Catheterization of bladder is performed after pelvic ultrasound shows absence of the uterus, no pelvic masses and a thickened area of mobile tissue in the bladder dome on the right side measuring 2.8 x 2.0 x 1.8 cm Vaginal: normal mucosa without prolapse or lesions and Atrophic tissues appropriate for age there is no evidence of the cervix. 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 Jerre Simon And abraded dictation is done on the day of evaluation and sent to nursing home [name] Christin Bach MD

## 2012-10-15 ENCOUNTER — Telehealth: Payer: Self-pay | Admitting: *Deleted

## 2012-10-15 NOTE — Telephone Encounter (Signed)
Spoke with [name], Interior and spatial designer of nursing at [employer], and a Child psychotherapist. Pt saw Dr. Emelda Fear and pt. has a tumor in her bladder. Dr. Emelda Fear recommended a referral to an urologist, either Dr. Jerre Simon or [facility]. [name] said they would make the referral.

## 2012-10-22 IMAGING — CR DG HIP COMPLETE 2+V*R*
4 series · 4 of 4 positions shown · non-contrast
Comparison: 11/03/2010

CLINICAL DATA: Right hip pain, prior hip replacement, dislocation

RIGHT HIP - COMPLETE 2+ VIEW

[view not recorded (1 of 4)]
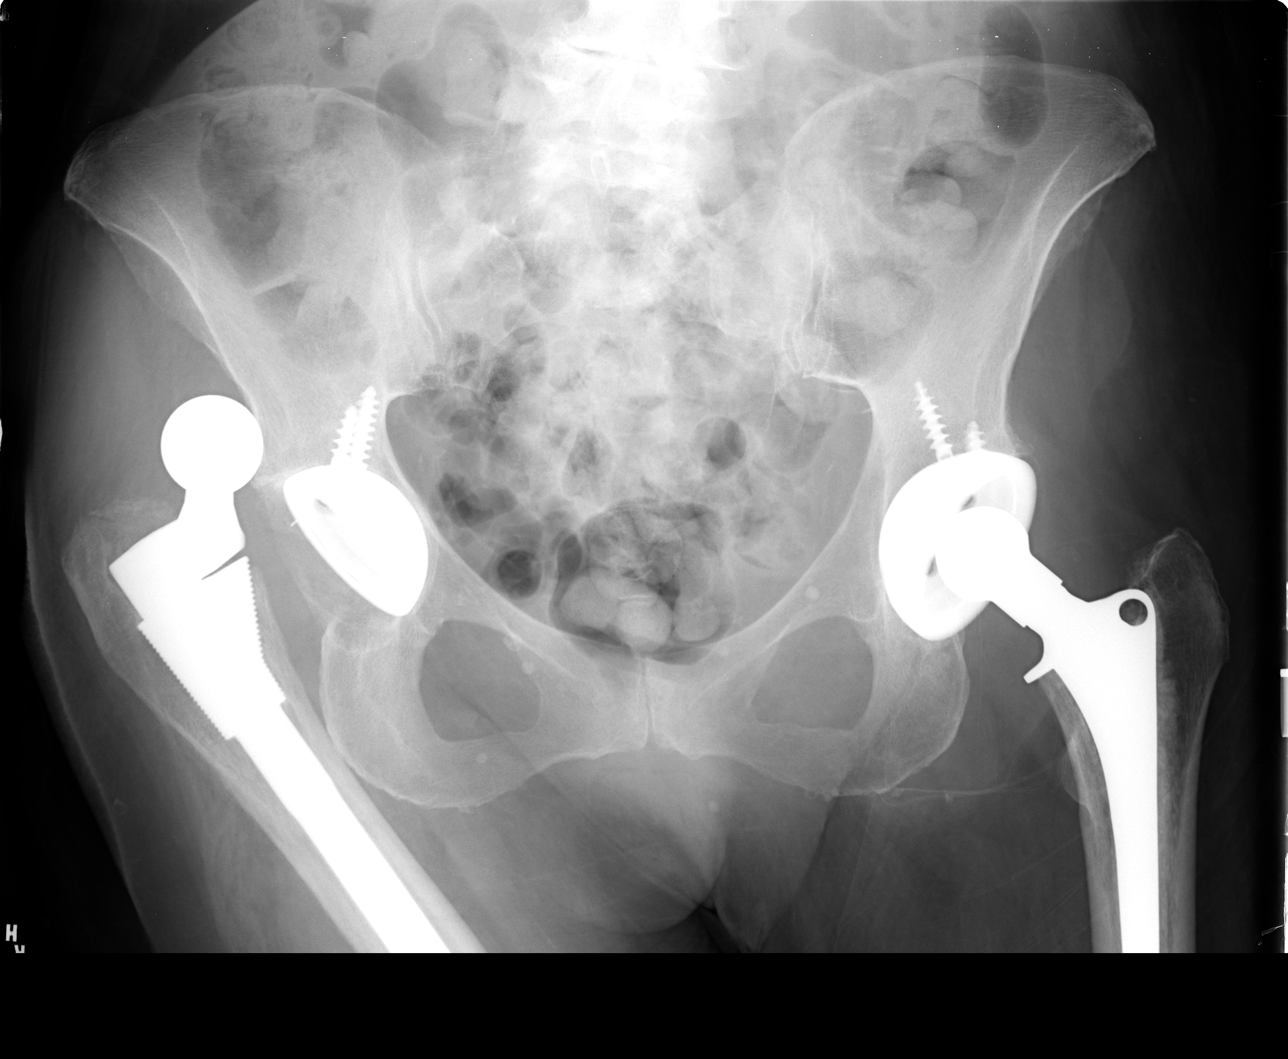

[view not recorded (2 of 4)]
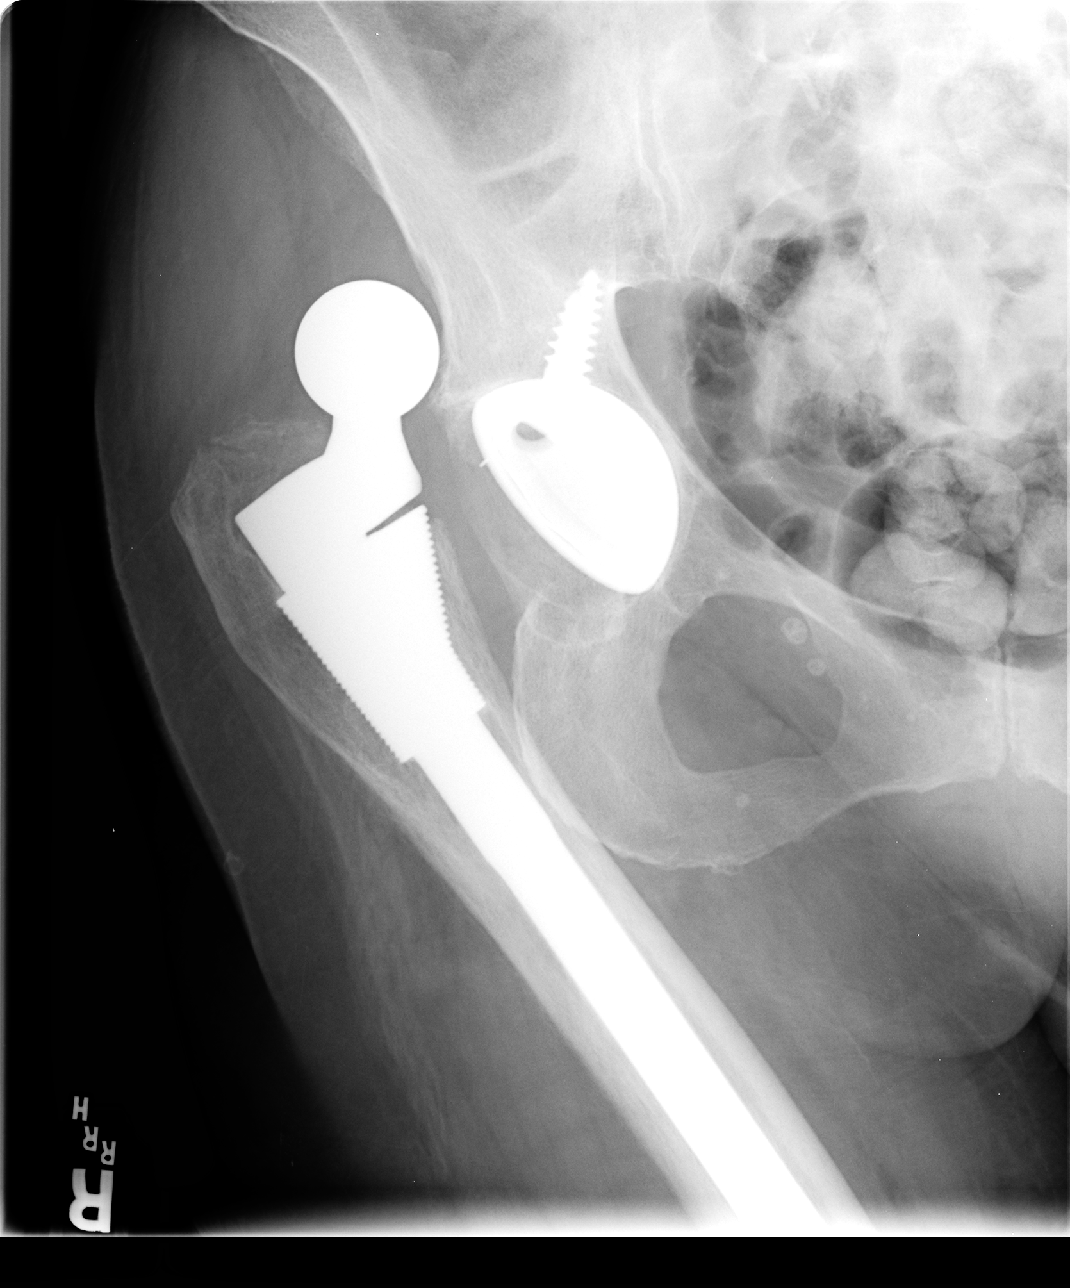

[view not recorded (3 of 4)]
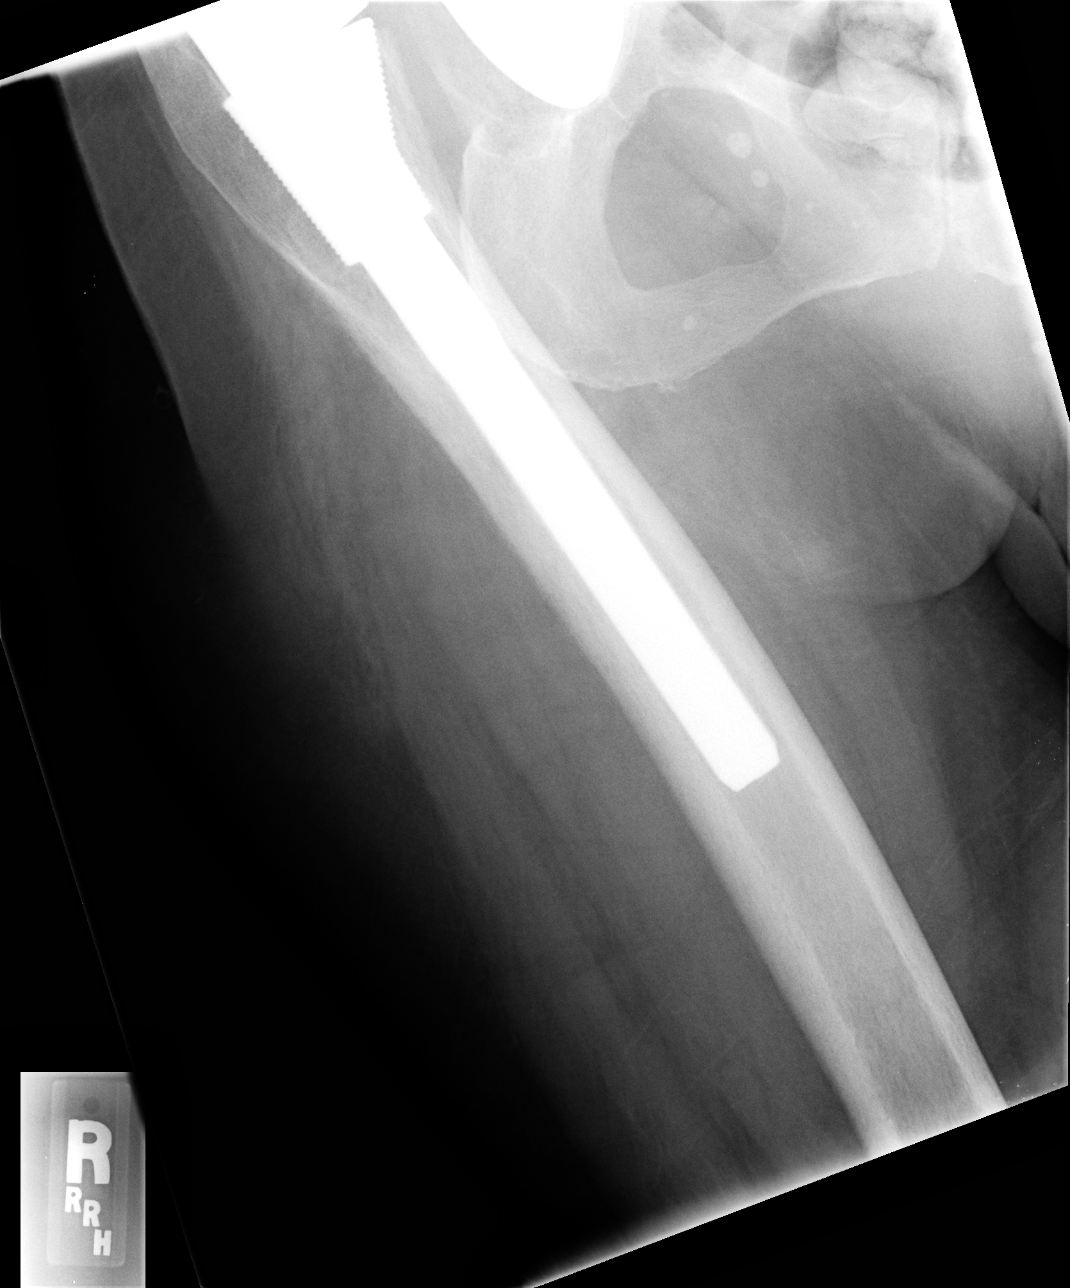

[view not recorded (4 of 4)]
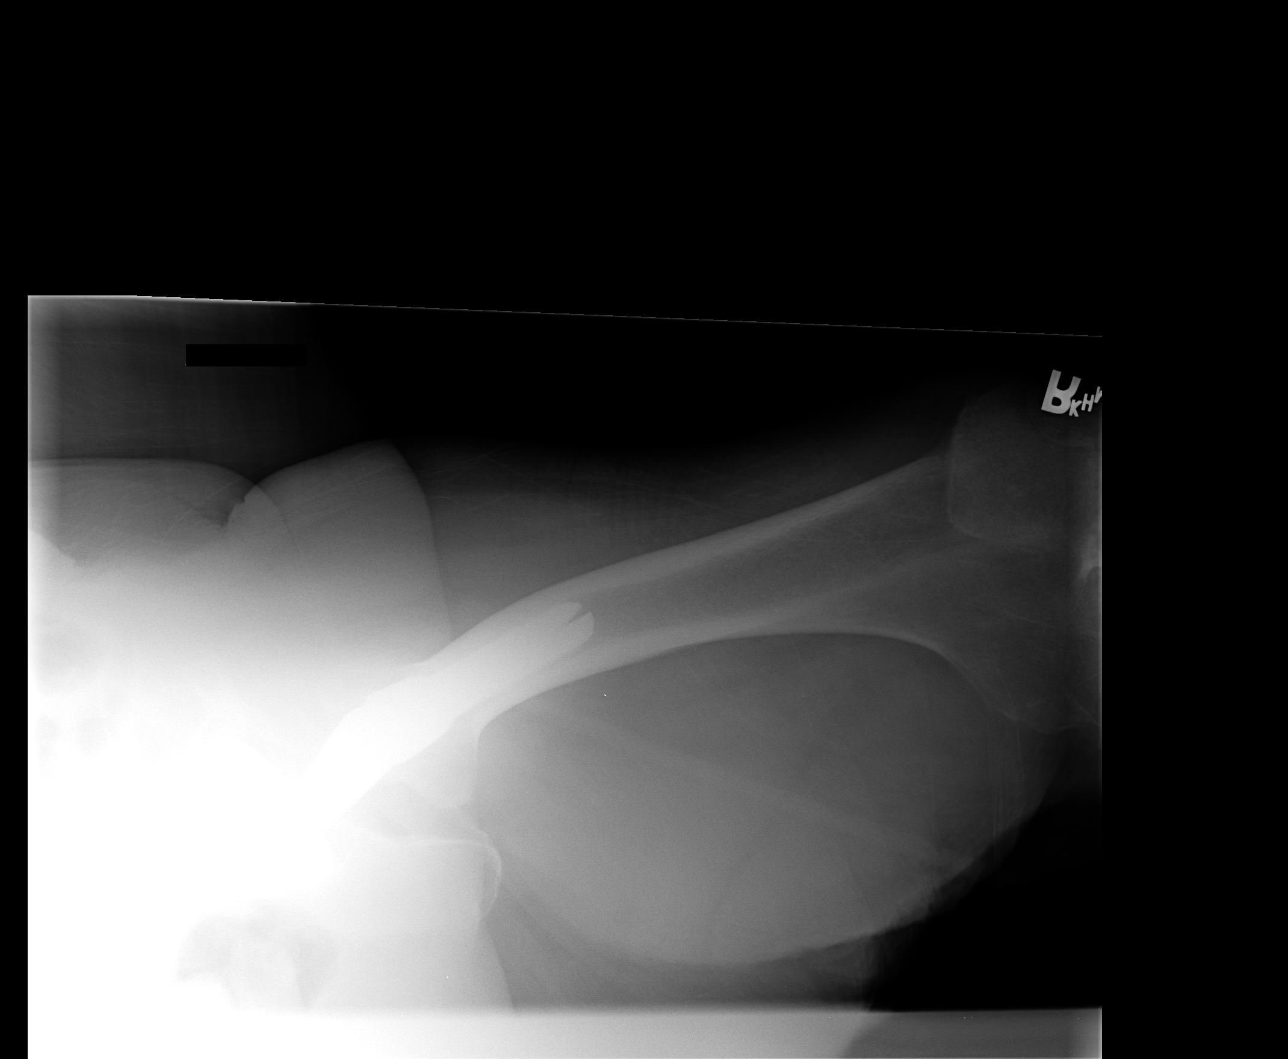

[4 of 4 positions shown; findings below may reference images not displayed]

FINDINGS: Bilateral hip prostheses.
Dislocation of right hip prosthesis with femoral head component
located superolateral to acetabular cup.
Left hip prosthesis appears normally located.
No periprosthetic lucency.
Bones appear mildly demineralized.
No fracture or bone destruction seen.
Scattered pelvic phleboliths.
IMPRESSION: Bilateral hip prostheses with dislocation of right hip prosthesis.

## 2012-11-29 ENCOUNTER — Encounter (HOSPITAL_COMMUNITY): Payer: Self-pay | Admitting: *Deleted

## 2012-11-29 ENCOUNTER — Inpatient Hospital Stay (HOSPITAL_COMMUNITY)
Admission: EM | Admit: 2012-11-29 | Discharge: 2012-12-04 | DRG: 871 | Disposition: A | Discharge status: Skilled Nursing Facility | Payer: Medicare Other | Attending: Internal Medicine | Admitting: Internal Medicine

## 2012-11-29 ENCOUNTER — Emergency Department (HOSPITAL_COMMUNITY): Payer: Medicare Other

## 2012-11-29 DIAGNOSIS — I491 Atrial premature depolarization: Secondary | ICD-10-CM | POA: Diagnosis present

## 2012-11-29 DIAGNOSIS — R579 Shock, unspecified: Secondary | ICD-10-CM

## 2012-11-29 DIAGNOSIS — N179 Acute kidney failure, unspecified: Secondary | ICD-10-CM

## 2012-11-29 DIAGNOSIS — N3289 Other specified disorders of bladder: Secondary | ICD-10-CM | POA: Diagnosis present

## 2012-11-29 DIAGNOSIS — M6281 Muscle weakness (generalized): Secondary | ICD-10-CM

## 2012-11-29 DIAGNOSIS — J449 Chronic obstructive pulmonary disease, unspecified: Secondary | ICD-10-CM | POA: Diagnosis present

## 2012-11-29 DIAGNOSIS — R319 Hematuria, unspecified: Secondary | ICD-10-CM

## 2012-11-29 DIAGNOSIS — G8929 Other chronic pain: Secondary | ICD-10-CM | POA: Diagnosis present

## 2012-11-29 DIAGNOSIS — E860 Dehydration: Secondary | ICD-10-CM

## 2012-11-29 DIAGNOSIS — Z96649 Presence of unspecified artificial hip joint: Secondary | ICD-10-CM

## 2012-11-29 DIAGNOSIS — I951 Orthostatic hypotension: Secondary | ICD-10-CM

## 2012-11-29 DIAGNOSIS — E876 Hypokalemia: Secondary | ICD-10-CM

## 2012-11-29 DIAGNOSIS — R634 Abnormal weight loss: Secondary | ICD-10-CM

## 2012-11-29 DIAGNOSIS — J309 Allergic rhinitis, unspecified: Secondary | ICD-10-CM

## 2012-11-29 DIAGNOSIS — A419 Sepsis, unspecified organism: Secondary | ICD-10-CM | POA: Diagnosis present

## 2012-11-29 DIAGNOSIS — E46 Unspecified protein-calorie malnutrition: Secondary | ICD-10-CM | POA: Diagnosis present

## 2012-11-29 DIAGNOSIS — G7241 Inclusion body myositis [IBM]: Secondary | ICD-10-CM | POA: Diagnosis present

## 2012-11-29 DIAGNOSIS — D62 Acute posthemorrhagic anemia: Secondary | ICD-10-CM | POA: Diagnosis present

## 2012-11-29 DIAGNOSIS — E039 Hypothyroidism, unspecified: Secondary | ICD-10-CM

## 2012-11-29 DIAGNOSIS — I4891 Unspecified atrial fibrillation: Secondary | ICD-10-CM

## 2012-11-29 DIAGNOSIS — R197 Diarrhea, unspecified: Secondary | ICD-10-CM

## 2012-11-29 DIAGNOSIS — D494 Neoplasm of unspecified behavior of bladder: Secondary | ICD-10-CM

## 2012-11-29 DIAGNOSIS — E2749 Other adrenocortical insufficiency: Secondary | ICD-10-CM | POA: Diagnosis present

## 2012-11-29 DIAGNOSIS — D500 Iron deficiency anemia secondary to blood loss (chronic): Secondary | ICD-10-CM | POA: Diagnosis present

## 2012-11-29 DIAGNOSIS — D7589 Other specified diseases of blood and blood-forming organs: Secondary | ICD-10-CM

## 2012-11-29 DIAGNOSIS — R1310 Dysphagia, unspecified: Secondary | ICD-10-CM

## 2012-11-29 DIAGNOSIS — D414 Neoplasm of uncertain behavior of bladder: Secondary | ICD-10-CM | POA: Diagnosis present

## 2012-11-29 DIAGNOSIS — E861 Hypovolemia: Secondary | ICD-10-CM | POA: Diagnosis present

## 2012-11-29 DIAGNOSIS — J4489 Other specified chronic obstructive pulmonary disease: Secondary | ICD-10-CM | POA: Diagnosis present

## 2012-11-29 DIAGNOSIS — R578 Other shock: Secondary | ICD-10-CM | POA: Diagnosis present

## 2012-11-29 DIAGNOSIS — J189 Pneumonia, unspecified organism: Secondary | ICD-10-CM | POA: Diagnosis present

## 2012-11-29 DIAGNOSIS — E872 Acidosis: Secondary | ICD-10-CM

## 2012-11-29 DIAGNOSIS — N390 Urinary tract infection, site not specified: Secondary | ICD-10-CM

## 2012-11-29 DIAGNOSIS — F418 Other specified anxiety disorders: Secondary | ICD-10-CM

## 2012-11-29 DIAGNOSIS — Z8781 Personal history of (healed) traumatic fracture: Secondary | ICD-10-CM

## 2012-11-29 DIAGNOSIS — F411 Generalized anxiety disorder: Secondary | ICD-10-CM | POA: Diagnosis present

## 2012-11-29 DIAGNOSIS — IMO0002 Reserved for concepts with insufficient information to code with codable children: Secondary | ICD-10-CM

## 2012-11-29 DIAGNOSIS — Z79899 Other long term (current) drug therapy: Secondary | ICD-10-CM

## 2012-11-29 DIAGNOSIS — J9600 Acute respiratory failure, unspecified whether with hypoxia or hypercapnia: Secondary | ICD-10-CM | POA: Diagnosis present

## 2012-11-29 DIAGNOSIS — I480 Paroxysmal atrial fibrillation: Secondary | ICD-10-CM

## 2012-11-29 DIAGNOSIS — E871 Hypo-osmolality and hyponatremia: Secondary | ICD-10-CM

## 2012-11-29 DIAGNOSIS — R310 Gross hematuria: Secondary | ICD-10-CM | POA: Diagnosis present

## 2012-11-29 DIAGNOSIS — W19XXXA Unspecified fall, initial encounter: Secondary | ICD-10-CM

## 2012-11-29 LAB — CBC WITH DIFFERENTIAL/PLATELET
Basophils Absolute: 0 10*3/uL (ref 0.0–0.1)
HCT: 18.2 % — ABNORMAL LOW (ref 36.0–46.0)
Lymphs Abs: 0.5 10*3/uL — ABNORMAL LOW (ref 0.7–4.0)
MCH: 32.6 pg (ref 26.0–34.0)
MCV: 98.9 fL (ref 78.0–100.0)
Monocytes Absolute: 0.4 10*3/uL (ref 0.1–1.0)
Monocytes Relative: 5 % (ref 3–12)
Neutro Abs: 7.6 10*3/uL (ref 1.7–7.7)
RDW: 16.2 % — ABNORMAL HIGH (ref 11.5–15.5)
WBC: 8.5 10*3/uL (ref 4.0–10.5)

## 2012-11-29 LAB — COMPREHENSIVE METABOLIC PANEL
ALT: 13 U/L (ref 0–35)
AST: 31 U/L (ref 0–37)
Albumin: 2.6 g/dL — ABNORMAL LOW (ref 3.5–5.2)
Alkaline Phosphatase: 84 U/L (ref 39–117)
BUN: 58 mg/dL — ABNORMAL HIGH (ref 6–23)
Chloride: 91 mEq/L — ABNORMAL LOW (ref 96–112)
Potassium: 5.1 mEq/L (ref 3.5–5.1)
Sodium: 126 mEq/L — ABNORMAL LOW (ref 135–145)
Total Bilirubin: 0.3 mg/dL (ref 0.3–1.2)
Total Protein: 6.4 g/dL (ref 6.0–8.3)

## 2012-11-29 LAB — URINALYSIS, ROUTINE W REFLEX MICROSCOPIC
Ketones, ur: >80 mg/dL — AB
Nitrite: POSITIVE — AB
Protein, ur: >300 mg/dL — AB
pH: 8 (ref 5.0–8.0)

## 2012-11-29 LAB — URINE MICROSCOPIC-ADD ON

## 2012-11-29 LAB — LACTIC ACID, PLASMA: Lactic Acid, Venous: 1.8 mmol/L (ref 0.5–2.2)

## 2012-11-29 LAB — TROPONIN I: Troponin I: <0.3 ng/mL (ref <0.30)

## 2012-11-29 MED ORDER — DEXTROSE 5 % IV SOLN
1.0000 g | Freq: Once | INTRAVENOUS | Status: AC
  Administered 2012-11-29: 1 g via INTRAVENOUS
  Filled 2012-11-29: qty 10

## 2012-11-29 MED ORDER — DOCUSATE SODIUM 100 MG PO CAPS
100.0000 mg | ORAL_CAPSULE | Freq: Every day | ORAL | Status: DC
  Administered 2012-11-30: 100 mg via ORAL
  Filled 2012-11-29 (×2): qty 1

## 2012-11-29 MED ORDER — PREDNISONE 10 MG PO TABS
10.0000 mg | ORAL_TABLET | Freq: Every day | ORAL | Status: DC
  Administered 2012-11-30: 10 mg via ORAL
  Filled 2012-11-29 (×2): qty 1

## 2012-11-29 MED ORDER — METOPROLOL TARTRATE 12.5 MG HALF TABLET
12.5000 mg | ORAL_TABLET | Freq: Two times a day (BID) | ORAL | Status: DC
  Filled 2012-11-29 (×3): qty 1

## 2012-11-29 MED ORDER — LEVOTHYROXINE SODIUM 112 MCG PO TABS
112.0000 ug | ORAL_TABLET | Freq: Every day | ORAL | Status: DC
  Administered 2012-11-30: 112 ug via ORAL
  Filled 2012-11-29 (×2): qty 1

## 2012-11-29 MED ORDER — ALBUTEROL SULFATE HFA 108 (90 BASE) MCG/ACT IN AERS
2.0000 | INHALATION_SPRAY | Freq: Four times a day (QID) | RESPIRATORY_TRACT | Status: DC | PRN
  Filled 2012-11-29: qty 6.7

## 2012-11-29 MED ORDER — L-METHYLFOLATE-B6-B12 3-35-2 MG PO TABS
1.0000 | ORAL_TABLET | Freq: Two times a day (BID) | ORAL | Status: DC
  Administered 2012-11-30: 1 via ORAL
  Filled 2012-11-29 (×3): qty 1

## 2012-11-29 MED ORDER — ONDANSETRON HCL 4 MG/2ML IJ SOLN: 4.0000 mg | Freq: Three times a day (TID) | INTRAMUSCULAR | Status: DC | PRN

## 2012-11-29 MED ORDER — DEXTROSE 5 % IV SOLN
500.0000 mg | INTRAVENOUS | Status: DC
  Administered 2012-11-29 – 2012-12-03 (×5): 500 mg via INTRAVENOUS
  Filled 2012-11-29 (×5): qty 500

## 2012-11-29 MED ORDER — PANTOPRAZOLE SODIUM 40 MG PO TBEC
40.0000 mg | DELAYED_RELEASE_TABLET | Freq: Every day | ORAL | Status: DC
  Filled 2012-11-29: qty 1

## 2012-11-29 MED ORDER — SODIUM CHLORIDE 0.9 % IV BOLUS (SEPSIS)
1000.0000 mL | Freq: Once | INTRAVENOUS | Status: AC
  Administered 2012-11-29: 1000 mL via INTRAVENOUS

## 2012-11-29 MED ORDER — DEXTROSE 5 % IV SOLN
1.0000 g | INTRAVENOUS | Status: DC
  Administered 2012-11-29 – 2012-12-03 (×5): 1 g via INTRAVENOUS
  Filled 2012-11-29 (×5): qty 1

## 2012-11-29 MED ORDER — LORATADINE 10 MG PO TABS
10.0000 mg | ORAL_TABLET | Freq: Every day | ORAL | Status: DC
  Filled 2012-11-29: qty 1

## 2012-11-29 MED ORDER — ACETAMINOPHEN 325 MG PO TABS
325.0000 mg | ORAL_TABLET | Freq: Four times a day (QID) | ORAL | Status: DC | PRN
  Administered 2012-11-29: 325 mg via ORAL
  Filled 2012-11-29: qty 1

## 2012-11-29 MED ORDER — SODIUM CHLORIDE 0.9 % IV SOLN: INTRAVENOUS | Status: DC

## 2012-11-29 MED ORDER — LIDOCAINE 5 % EX PTCH
1.0000 | MEDICATED_PATCH | CUTANEOUS | Status: DC
  Administered 2012-11-30 – 2012-12-04 (×5): 1 via TRANSDERMAL
  Filled 2012-11-29 (×5): qty 1

## 2012-11-29 MED ORDER — ADULT MULTIVITAMIN W/MINERALS CH
1.0000 | ORAL_TABLET | Freq: Every day | ORAL | Status: DC
  Filled 2012-11-29: qty 1

## 2012-11-29 MED ORDER — ALPRAZOLAM 0.5 MG PO TABS
0.5000 mg | ORAL_TABLET | Freq: Two times a day (BID) | ORAL | Status: DC | PRN
  Administered 2012-11-29: 0.5 mg via ORAL
  Filled 2012-11-29: qty 1

## 2012-11-29 MED ORDER — MORPHINE SULFATE 2 MG/ML IJ SOLN: 2.0000 mg | INTRAMUSCULAR | Status: DC | PRN

## 2012-11-29 MED ORDER — LORAZEPAM 2 MG/ML IJ SOLN
INTRAMUSCULAR | Status: AC
  Filled 2012-11-29: qty 1

## 2012-11-29 MED ORDER — MIRTAZAPINE 7.5 MG PO TABS
7.5000 mg | ORAL_TABLET | Freq: Every day | ORAL | Status: DC
  Administered 2012-11-30: 7.5 mg via ORAL
  Filled 2012-11-29 (×2): qty 1

## 2012-11-29 MED ORDER — LORAZEPAM 2 MG/ML IJ SOLN
0.5000 mg | Freq: Once | INTRAMUSCULAR | Status: AC
  Administered 2012-11-29: 0.5 mg via INTRAVENOUS

## 2012-11-29 MED ORDER — ONDANSETRON HCL 4 MG/2ML IJ SOLN
4.0000 mg | Freq: Four times a day (QID) | INTRAMUSCULAR | Status: DC | PRN
  Administered 2012-12-02: 4 mg via INTRAVENOUS
  Filled 2012-11-29: qty 2

## 2012-11-29 MED ORDER — ASPIRIN 81 MG PO CHEW
81.0000 mg | CHEWABLE_TABLET | Freq: Every day | ORAL | Status: DC
  Filled 2012-11-29: qty 1

## 2012-11-29 MED ORDER — POLYETHYLENE GLYCOL 3350 17 G PO PACK
17.0000 g | PACK | Freq: Every day | ORAL | Status: DC | PRN
  Filled 2012-11-29: qty 1

## 2012-11-29 MED ORDER — FLUTICASONE PROPIONATE 50 MCG/ACT NA SUSP
2.0000 | Freq: Every day | NASAL | Status: DC
  Filled 2012-11-29: qty 16

## 2012-11-29 MED ORDER — SODIUM CHLORIDE 0.9 % IV SOLN: 250.0000 mL | INTRAVENOUS | Status: DC | PRN

## 2012-11-29 MED ORDER — FOLIC ACID 1 MG PO TABS
1.0000 mg | ORAL_TABLET | Freq: Every day | ORAL | Status: DC
  Filled 2012-11-29: qty 1

## 2012-11-29 NOTE — ED Notes (Signed)
Pt in severe lower abd pain "for awhile", pt pale in color, states blood in urine " for awhile" as well, reported that pt has a tumor to bladder

## 2012-11-29 NOTE — ED Notes (Signed)
Brandy R.N. Notified of pulse rate.

## 2012-11-29 NOTE — ED Notes (Addendum)
[facility] has two units of PRBC's to transfuse en route to [facility], blood consent obtained earlier

## 2012-11-29 NOTE — H&P (Signed)
PULMONARY  / CRITICAL CARE MEDICINE H&P   Name: [name] MRN: [id number] DOB: [birth year]    ADMISSION DATE:  11/29/2012  PRIMARY SERVICE: PCCM  CHIEF COMPLAINT:  Hematuria  BRIEF PATIENT DESCRIPTION: 79 yo F with hematuria and anemia 2/2 likely bladder cancer and afib with RVR.  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  She has had abdominal pain and hematuria for several months.  She was seen by ob/gyn who expressed concern for bladder mass/cancer last month an she was awaiting referral to urology.  She denies fever, chills, SOB or chest pain.  She has an unchanged chronic cough that she attributes to allergies.  She has chronic joint pain particularly in her ankles attributed to arthritis.  She has had nausea w/o emesis.  In the ED she was found to be in afib with RVR and hgb was 6.  She was started on 2 units of pRBC and transferred to [facility].  PAST MEDICAL HISTORY :  Past Medical History  Diagnosis Date  . Hypothyroidism   . Atrial fibrillation   . Myositis     BODY  . LVH (left ventricular hypertrophy)     MILD  . Dizziness   . Headache   . Weakness   . Chest pain   . DOE (dyspnea on exertion)   . Poor appetite   . UTI (urinary tract infection)   . Dysphagia     History of dysphagia 2, depressed by esophagus status post dilation  . Arthritis   . Hypokalemia   . History of anxiety   . Orthostatic hypotension   . Inclusion body myositis   . Easy bruising   . Complication of anesthesia     anxiety before anesthesia, wants spinal  . Myalgia and myositis, unspecified 04/30/2011  . Malnutrition/Cachexia 05/05/2012    Pre-morbid body weight 150 lbs, 04/2012 <100 lbs   . Failure to thrive 05/05/2012   . Macrocytosis 05/05/2012  . Diarrhea 05/05/2012  . Anxiety    Past Surgical History  Procedure Laterality Date  . Total hip arthroplasty  2000 or 2001    right  . Transthoracic echocardiogram  07/08/10    EF 55-60%  . Left total hip arthroplasty  1996  . Total hip revision  12/18/2011    Procedure: TOTAL HIP REVISION;  Surgeon: Loanne Drilling, MD;  Location: [facility];  Service: Orthopedics;  Laterality: Right;  Right Hip Revision to Constrained Liner-acetabular   . Flexible sigmoidoscopy  05/07/2012    Procedure: FLEXIBLE SIGMOIDOSCOPY;  Surgeon: Malissa Hippo, MD;  Location: [facility];  Service: Endoscopy;  Laterality: N/A;  . Partial hysterectomy N/A     distant history, type unknown   Prior to Admission medications   Medication Sig Start Date End Date Taking? Authorizing Provider  ALPRAZolam Prudy Feeler) 0.5 MG tablet Take 1 tablet (0.5 mg total) by mouth 2 (two) times daily as needed for sleep or anxiety. anxiety 05/10/12  Yes Elliot Cousin, MD  aspirin 81 MG chewable tablet Chew 81 mg by mouth daily.   Yes Historical Provider, MD  azathioprine (IMURAN) 100 MG tablet Take 100 mg by mouth daily.   Yes Historical Provider, MD  azelastine (ASTELIN) 137  MCG/SPRAY nasal spray Place 1 spray into the nose 2 (two) times daily. Use in each nostril as directed   Yes Historical Provider, MD  docusate sodium (COLACE) 100 MG capsule Take 100 mg by mouth daily.   Yes Historical Provider, MD  feeding supplement (PRO-STAT SUGAR FREE 64) LIQD Take 30 mLs by mouth 2 (two) times daily. 05/10/12  Yes Elliot Cousin, MD  fluticasone (FLONASE) 50 MCG/ACT nasal spray Place 2 sprays into the nose daily.   Yes Historical Provider, MD  folic acid (FOLVITE) 1 MG tablet Take 1 tablet (1 mg total) by mouth daily. 05/10/12  Yes Elliot Cousin, MD  l-methylfolate-B6-B12 (METANX) 3-35-2 MG TABS Take 1 tablet by mouth 2 (two) times daily.   Yes Historical Provider, MD  levothyroxine (SYNTHROID, LEVOTHROID) 112 MCG tablet  Take 112 mcg by mouth daily before breakfast.   Yes Historical Provider, MD  lidocaine (LIDODERM) 5 % Place 1 patch onto the skin daily. Remove & Discard patch within 12 hours or as directed by MD   Yes Historical Provider, MD  loratadine (CLARITIN) 10 MG tablet Take 10 mg by mouth daily.   Yes Historical Provider, MD  metoprolol tartrate (LOPRESSOR) 25 MG tablet Take 12.5 mg by mouth every morning. 02/10/12  Yes Cassell Clement, MD  mirtazapine (REMERON) 15 MG tablet Take 7.5 mg by mouth at bedtime.   Yes Historical Provider, MD  Multiple Vitamin (MULTIVITAMIN WITH MINERALS) TABS Take 1 tablet by mouth daily. 05/10/12  Yes Elliot Cousin, MD  Nutritional Supplements (RESOURCE 2.0 PO) Take 1 each by mouth 2 (two) times daily.    Yes Historical Provider, MD  omeprazole (PRILOSEC) 20 MG capsule Take 20 mg by mouth daily.   Yes Historical Provider, MD  PENNSAID 1.5 % SOLN Apply 10 drops topically 3 (three) times daily.  09/07/12  Yes Historical Provider, MD  predniSONE (DELTASONE) 10 MG tablet Take 1 tablet (10 mg total) by mouth daily with breakfast. 05/10/12  Yes Elliot Cousin, MD  traMADol (ULTRAM) 50 MG tablet Take 100 mg by mouth 3 (three) times daily.    Yes Historical Provider, MD  acetaminophen (TYLENOL) 325 MG tablet Take 325 mg by mouth every 4 (four) hours as needed. The patient takes 2 tabs by mouth    Historical Provider, MD  albuterol (PROVENTIL HFA;VENTOLIN HFA) 108 (90 BASE) MCG/ACT inhaler Inhale 2 puffs into the lungs every 6 (six) hours as needed. Shortness of breath    Historical Provider, MD  alum & mag hydroxide-simeth (MAALOX PLUS) 400-400-40 MG/5ML suspension Take 5 mLs by mouth as needed for indigestion.     Historical Provider, MD  guaifenesin (ROBITUSSIN) 100 MG/5ML syrup Take 200 mg by mouth every 6 (six) hours as needed for cough or congestion.     Historical Provider, MD  loperamide (IMODIUM) 2 MG capsule Take 1 capsule (2 mg total) by mouth as needed for diarrhea or loose stools  (Give after each loose BM, not to exceed eight capsules (16mg ) in 24hr). 05/10/12   Elliot Cousin, MD  ondansetron (ZOFRAN) 4 MG tablet Take 1 tablet (4 mg total) by mouth every 8 (eight) hours as needed for nausea. 07/28/12   Malissa Hippo, MD   Allergies  Allergen Reactions  . Codeine Other (See Comments)    unknown  . Morphine And Related Other (See Comments)    unknown  . Piroxicam Other (See Comments)    unknown    FAMILY HISTORY:  History reviewed. No pertinent family history. SOCIAL HISTORY:  reports that she has never smoked. She has never used smokeless tobacco. She reports that she does not drink alcohol or use illicit drugs.  REVIEW OF SYSTEMS:  Review of 10 systems negative except as listed in HPI.  SUBJECTIVE:   VITAL SIGNS: Temp:  [97.9 F (36.6 C)-98.1 F (36.7 C)] 97.9 F (36.6 C) (05/25 2000) Pulse Rate:  [25-148] 121 (05/25 2200) Resp:  [17-49] 18 (05/25 2200) BP: (85-106)/(60-74) 92/63 mmHg (05/25 2200) SpO2:  [91 %-100 %] 100 % (05/25 2200)   INTAKE / OUTPUT: Intake/Output     05/25 0701 - 05/26 0700   Urine 8   Total Output 8   Net -8         PHYSICAL EXAMINATION: General:  Awake, alert, NAD Neuro:  Alert, oriented HEENT:  Pale conjunctiva, PERRL, EOMI, no oral lesions. Cardiovascular:  Tachy irreg, irreg Lungs:  CTAB Abdomen:  Milding tenderness in lower abdomen, soft, no guarding or rebound, nl BS Musculoskeletal:  Muscle atrophy, arthritic changes in joints Skin:  No rash  LABS:  Recent Labs Lab 11/29/12 1650  HGB 6.0*  WBC 8.5  PLT 340  NA 126*  K 5.1  CL 91*  CO2 21  GLUCOSE 90  BUN 58*  CREATININE 1.90*  CALCIUM 8.9  AST 31  ALT 13  ALKPHOS 84  BILITOT 0.3  PROT 6.4  ALBUMIN 2.6*  APTT 33  INR 0.98  LATICACIDVEN 1.8  TROPONINI <0.30   No results found for this basename: GLUCAP,  in the last 168 hours  CXR:  Pneumonia involving the right mid lung and left lung base,  superimposed upon  COPD/emphysema.   ASSESSMENT / PLAN:  PULMONARY A: Possible PNA on CXR, stable on [place] P:   - Cover PNA with cefepime and azithro   CARDIOVASCULAR A: H/o afib per SNF records, RVR likely due to anemia/hypoveolemia P:  - HR improved to 120s with pRBCs.  - Will try low dose home metoprolol if BP will tolerate - Check TSH - Consider stress dose steroids if becoming more hypotensive given chronic prednisone use  RENAL A:   AKI - Possible hypovolemia. Possible obstruction given bladder mass. P:   - IVF at 100/hr - Renal US to evaluate for obstruction - Repeat BMET in AM  Bladder mass with hematuria- likely bladder cancer - Renal US - Can get urology c/s in AM    GASTROINTESTINAL A:  Nausea, abdominal pain - likely from bladder cancer P:   - Portable KUB - Zofran prn - Will likely need CT scan when stable  HEMATOLOGIC A:  Anemia - likely from longstanding hematuria P:  - Getting 2nd unit pRBC now - Repeat CBC after transfusion complete and in AM - Add on iron studies  INFECTIOUS A:  Cover for UTI and PNA, UA difficult to interpret with blood P:   - Cepemine and azithromycin -  BLood urine cx pending  ENDOCRINE A:  Hypothyroid  P:   - Check TSH - Continue home synthroid  NEUROLOGIC A:  Inlcusion body myositis, details unclear. On prednisone and azathioprin presumably for this P:   - Continue prednisone 10 mg, will need stress dose steroids if more hypotensive - Hold azathioprine for now  TODAY'S SUMMARY: Transfuse pRBC, watch afib, cover for UTI and PNA  I have personally obtained a history, examined the patient, evaluated laboratory and imaging results, formulated the assessment and plan and placed orders. CRITICAL CARE: The patient is critically ill with multiple organ systems failure  and requires high complexity decision making for assessment and support, frequent evaluation and titration of therapies, application of advanced monitoring technologies and  extensive interpretation of multiple databases. Critical Care Time devoted to patient care services described in this note is 45 minutes.   Hanni Milford M.D. Pulmonary and Critical Care Medicine [facility] Pager: [phone]  11/29/2012, 10:17 PM

## 2012-11-29 NOTE — ED Notes (Signed)
CRITICAL VALUE ALERT  Critical value received:  Hbg6.0  Date of notification:  11/29/12  Time of notification:  1703  Critical value read back:yes  Nurse who received alert:  Vanetta Mulders, RN  MD notified (1st page):  Hyacinth Meeker  Time of first page:  1703  MD notified (2nd page):  Time of second page:  Responding MD:  Hyacinth Meeker  Time MD responded:  [phone]

## 2012-11-29 NOTE — Progress Notes (Signed)
ANTIBIOTIC CONSULT NOTE - INITIAL  Pharmacy Consult for Cefepime Indication: Urinary infection, Pneumonia  Allergies  Allergen Reactions  . Codeine Other (See Comments)    unknown  . Morphine And Related Other (See Comments)    unknown  . Piroxicam Other (See Comments)    unknown    Patient Measurements: Height: 5\' 4"  (162.6 cm) Weight: 126 lb 5.2 oz (57.3 kg) IBW/kg (Calculated) : 54.7  Vital Signs: Temp: 98.5 F (36.9 C) (05/25 2200) Temp src: Oral (05/25 2200) BP: 92/63 mmHg (05/25 2200) Pulse Rate: 121 (05/25 2200) Intake/Output from previous day:   Intake/Output from this shift:    Labs:  Recent Labs  11/29/12 1650  WBC 8.5  HGB 6.0*  PLT 340  CREATININE 1.90*   Estimated Creatinine Clearance: 18.7 ml/min (by C-G formula based on Cr of 1.9). No results found for this basename: VANCOTROUGH, Leodis Binet, VANCORANDOM, GENTTROUGH, GENTPEAK, GENTRANDOM, TOBRATROUGH, TOBRAPEAK, TOBRARND, AMIKACINPEAK, AMIKACINTROU, AMIKACIN,  in the last 72 hours   Microbiology: Recent Results (from the past 720 hour(s))  CULTURE, BLOOD (ROUTINE X 2)     Status: None   Collection Time    11/29/12  4:45 PM      Result Value Range Status   Specimen Description BLOOD LEFT ARM   Final   Special Requests     Final   Value: BOTTLES DRAWN AEROBIC AND ANAEROBIC 6CC EACH BOTTLE   Culture PENDING   Incomplete   Report Status PENDING   Incomplete  MRSA PCR SCREENING     Status: None   Collection Time    11/29/12  8:28 PM      Result Value Range Status   MRSA by PCR NEGATIVE  NEGATIVE Final   Comment:            The GeneXpert MRSA Assay (FDA     approved for NASAL specimens     only), is one component of a     comprehensive MRSA colonization     surveillance program. It is not     intended to diagnose MRSA     infection nor to guide or     monitor treatment for     MRSA infections.    Medical History: Past Medical History  Diagnosis Date  . Hypothyroidism   . Atrial  fibrillation   . Myositis     BODY  . LVH (left ventricular hypertrophy)     MILD  . Dizziness   . Headache   . Weakness   . Chest pain   . DOE (dyspnea on exertion)   . Poor appetite   . UTI (urinary tract infection)   . Dysphagia     History of dysphagia 2, depressed by esophagus status post dilation  . Arthritis   . Hypokalemia   . History of anxiety   . Orthostatic hypotension   . Inclusion body myositis   . Easy bruising   . Complication of anesthesia     anxiety before anesthesia, wants spinal  . Myalgia and myositis, unspecified 04/30/2011  . Malnutrition/Cachexia 05/05/2012    Pre-morbid body weight 150 lbs, 04/2012 <100 lbs   . Failure to thrive 05/05/2012  . Macrocytosis 05/05/2012  . Diarrhea 05/05/2012  . Anxiety     Medications:  Scheduled:  . [START ON 11/30/2012] aspirin  81 mg Oral Daily  . azithromycin  500 mg Intravenous Q24H  . ceFEPime (MAXIPIME) IV  1 g Intravenous Q12H  . docusate sodium  100 mg Oral Daily  . [  START ON 11/30/2012] fluticasone  2 spray Each Nare Daily  . [START ON 11/30/2012] folic acid  1 mg Oral Daily  . l-methylfolate-B6-B12  1 tablet Oral BID  . [START ON 11/30/2012] levothyroxine  112 mcg Oral QAC breakfast  . [START ON 11/30/2012] lidocaine  1 patch Transdermal Q24H  . [START ON 11/30/2012] loratadine  10 mg Oral Daily  . metoprolol tartrate  12.5 mg Oral BID  . mirtazapine  7.5 mg Oral QHS  . [START ON 11/30/2012] multivitamin with minerals  1 tablet Oral Daily  . [START ON 11/30/2012] pantoprazole  40 mg Oral Daily  . [START ON 11/30/2012] predniSONE  10 mg Oral Q breakfast   Infusions:  . sodium chloride     Assessment:  79 year old female with complaint of hematuria.  Pt with h/o bladder tumor.  Urine and blood cultures ordered  Hgb = 6  Chest Xray = PNA  IV Cefepime to begin empirically for urine infection and pneumonia with pharmacy to dose  CrCl currently ~ 19 ml/min  Goal of Therapy:  Eradication of  suspected infection  Plan:   Change Cefepime to 1gm IV q24h  Follow renal function, cultures/sensitivities  Shron Ozer, Joselyn Glassman, PharmD 11/29/2012,10:51 PM

## 2012-11-29 NOTE — ED Provider Notes (Signed)
History   This chart was scribed for Marissa Roller, MD by Bennett Scrape, ED Scribe. This patient was seen in room APA08/APA08 and the patient's care was started at 3:38 PM.  CSN: [id number]  Arrival date & time 11/29/12  1518   First MD Initiated Contact with Patient 11/29/12 1536      Chief Complaint  Patient presents with  . Abdominal Pain  . Hematuria    The history is provided by a relative and the patient. No language interpreter was used.   Level 5 Caveat for Severity of illness, poor historian and mild respiratory distress  HPI Comments: [name] is a 79 y.o. female, who has lived at the [name] nursing home for the past year due to weakness, who presents to the Emergency Department complaining of rectal pain which has been occurring for more than one day, but became "bad" today. The pt reports nausea but denies any fever, chills, or emesis. The son also adds that the pt has not been eating well. Pt had an OB/GYN visit 6 weeks ago where there was gross hematuria on the urine catherization at the office. At the office, she also had a vaginal exam and a pelvic ultra sound that showed no pelvic masses but a thickened area of mobile tissue in the bladder. There was no vaginal bleeding on the exam. Based on visualization on ultrasound, she was determined to have possible bladder cancer. There has not been any recent imaging. Son reports that the pt had an appointment with urology scheduled for last week but states that it was cancelled with no plans to reschedule. Pt has a h/o hysterocectomy. Son denies a prior cardiac history. He reports that the pt was diagnosed with an "incurrable muscle disease" when she was younger but is vague on the details.                                                                        PCP is Dr. Soule Scotland.    Past Medical History  Diagnosis Date  . Hypothyroidism   . Atrial fibrillation   . Myositis     BODY  . LVH (left ventricular hypertrophy)      MILD  . Dizziness   . Headache   . Weakness   . Chest pain   . DOE (dyspnea on exertion)   . Poor appetite   . UTI (urinary tract infection)   . Dysphagia     History of dysphagia 2, depressed by esophagus status post dilation  . Arthritis   . Hypokalemia   . History of anxiety   . Orthostatic hypotension   . Inclusion body myositis   . Easy bruising   . Complication of anesthesia     anxiety before anesthesia, wants spinal  . Myalgia and myositis, unspecified 04/30/2011  . Malnutrition/Cachexia 05/05/2012    Pre-morbid body weight 150 lbs, 04/2012 <100 lbs   . Failure to thrive 05/05/2012  . Macrocytosis 05/05/2012  . Diarrhea 05/05/2012  . Anxiety     Past Surgical History  Procedure Laterality Date  . Total hip arthroplasty  2000 or 2001    right  . Transthoracic echocardiogram  07/08/10    EF 55-60%  . Left  total hip arthroplasty  1996  . Total hip revision  12/18/2011    Procedure: TOTAL HIP REVISION;  Surgeon: Loanne Drilling, MD;  Location: [facility];  Service: Orthopedics;  Laterality: Right;  Right Hip Revision to Constrained Liner-acetabular   . Flexible sigmoidoscopy  05/07/2012    Procedure: FLEXIBLE SIGMOIDOSCOPY;  Surgeon: Malissa Hippo, MD;  Location: [facility];  Service: Endoscopy;  Laterality: N/A;  . Partial hysterectomy N/A     distant history, type unknown    History reviewed. No pertinent family history.  History  Substance Use Topics  . Smoking status: Never Smoker   . Smokeless tobacco: Never Used  . Alcohol Use: No    No OB history provided.  Review of Systems  Unable to perform ROS: Other    Allergies  Codeine; Morphine and related; and Piroxicam  Home Medications   Current Outpatient Rx  Name  Route  Sig  Dispense  Refill  . ALPRAZolam (XANAX) 0.5 MG tablet   Oral   Take 1 tablet (0.5 mg total) by mouth 2 (two) times daily as needed for sleep or anxiety. anxiety   30 tablet   0   . aspirin 81 MG chewable tablet    Oral   Chew 81 mg by mouth daily.         Marland Kitchen azathioprine (IMURAN) 100 MG tablet   Oral   Take 100 mg by mouth daily.         Marland Kitchen azelastine (ASTELIN) 137 MCG/SPRAY nasal spray   Nasal   Place 1 spray into the nose 2 (two) times daily. Use in each nostril as directed         . docusate sodium (COLACE) 100 MG capsule   Oral   Take 100 mg by mouth daily.         . feeding supplement (PRO-STAT SUGAR FREE 64) LIQD   Oral   Take 30 mLs by mouth 2 (two) times daily.   900 mL      . fluticasone (FLONASE) 50 MCG/ACT nasal spray   Nasal   Place 2 sprays into the nose daily.         . folic acid (FOLVITE) 1 MG tablet   Oral   Take 1 tablet (1 mg total) by mouth daily.         Marland Kitchen l-methylfolate-B6-B12 (METANX) 3-35-2 MG TABS   Oral   Take 1 tablet by mouth 2 (two) times daily.         Marland Kitchen levothyroxine (SYNTHROID, LEVOTHROID) 112 MCG tablet   Oral   Take 112 mcg by mouth daily before breakfast.         . lidocaine (LIDODERM) 5 %   Transdermal   Place 1 patch onto the skin daily. Remove & Discard patch within 12 hours or as directed by MD         . loratadine (CLARITIN) 10 MG tablet   Oral   Take 10 mg by mouth daily.         . metoprolol tartrate (LOPRESSOR) 25 MG tablet   Oral   Take 12.5 mg by mouth every morning.         . mirtazapine (REMERON) 15 MG tablet   Oral   Take 7.5 mg by mouth at bedtime.         . Multiple Vitamin (MULTIVITAMIN WITH MINERALS) TABS   Oral   Take 1 tablet by mouth daily.         Marland Kitchen  Nutritional Supplements (RESOURCE 2.0 PO)   Oral   Take 1 each by mouth 2 (two) times daily.          Marland Kitchen omeprazole (PRILOSEC) 20 MG capsule   Oral   Take 20 mg by mouth daily.         Marland Kitchen PENNSAID 1.5 % SOLN   Apply externally   Apply 10 drops topically 3 (three) times daily.          . predniSONE (DELTASONE) 10 MG tablet   Oral   Take 1 tablet (10 mg total) by mouth daily with breakfast.         . traMADol (ULTRAM) 50 MG  tablet   Oral   Take 100 mg by mouth 3 (three) times daily.          Marland Kitchen acetaminophen (TYLENOL) 325 MG tablet   Oral   Take 325 mg by mouth every 4 (four) hours as needed. The patient takes 2 tabs by mouth         . albuterol (PROVENTIL HFA;VENTOLIN HFA) 108 (90 BASE) MCG/ACT inhaler   Inhalation   Inhale 2 puffs into the lungs every 6 (six) hours as needed. Shortness of breath         . alum & mag hydroxide-simeth (MAALOX PLUS) 400-400-40 MG/5ML suspension   Oral   Take 5 mLs by mouth as needed for indigestion.          Marland Kitchen guaifenesin (ROBITUSSIN) 100 MG/5ML syrup   Oral   Take 200 mg by mouth every 6 (six) hours as needed for cough or congestion.          Marland Kitchen loperamide (IMODIUM) 2 MG capsule   Oral   Take 1 capsule (2 mg total) by mouth as needed for diarrhea or loose stools (Give after each loose BM, not to exceed eight capsules (16mg ) in 24hr).   30 capsule      . ondansetron (ZOFRAN) 4 MG tablet   Oral   Take 1 tablet (4 mg total) by mouth every 8 (eight) hours as needed for nausea.   30 tablet   1     Triage Vitals: BP 106/64  Pulse 145  Temp(Src) 98.1 F (36.7 C) (Oral)  Resp 49  SpO2 100%  Physical Exam  Nursing note and vitals reviewed. Constitutional: She is oriented to person, place, and time. She appears cachectic. No distress.  HENT:  Head: Normocephalic and atraumatic.  Mouth/Throat: Oropharynx is clear and moist.  Eyes: EOM are normal.  Pale conjuctiva of eyes.   Neck: Neck supple. No tracheal deviation present.  Cardiovascular: Normal rate.   Weak, thready pulses at radial arteries.  Regular tachycardia.   Pulmonary/Chest:  Increased work of breathing.  Tachypnea.   Abdominal: Soft. There is tenderness. There is no guarding.  Increased bowel sounds. A lidoderm patch on suprapubic region.  Mild diffuse tenderness, soft. No peritoneal signs. No guarding.   Genitourinary:  No hemorrhoids. Formed brown stool present in the rectal vault.  Hemoccult negative. No blood on perineum. No blood around anus. Adult undergarment full of blood and foul-smelling urine. Chaperone present.  Musculoskeletal: Normal range of motion. She exhibits no edema.  Neurological: She is alert and oriented to person, place, and time.  Skin: Skin is warm and dry.  Pale nail beds  Psychiatric: She has a normal mood and affect. Her behavior is normal.    ED Course  Procedures (including critical care time)  DIAGNOSTIC STUDIES: Oxygen Saturation is  100% on room air, normal by my interpretation.    COORDINATION OF CARE:  3:44 PM- Discussed treatment plan with son which includes  blood work, x-rays, and   catheter.   4:20 PM- Rechecked pt. Discussed prior heart problems with the son, and her son denies a history of any heart problems. Discussed the diagnosis of bladder cancer with her son as an explanation for her symptoms.  6:27 PM- Rechecked pt. Informed her that she has pneumonia in her right lung which has caused her heart rate to increase and caused her labored breathing. Also told her that she has lost so much blood that a blood transfusion is necessary. Upon asking, pt reported that she feels as if she is breathing well.  Prescribed medication to calm the pt. Removed nail polish from the pt's fingers for a better oxygen saturation assessment. Oxygenation 99%. Pt still tachypneic. She is mentating at her base line. Pt agreed to plan.      Labs Reviewed  COMPREHENSIVE METABOLIC PANEL - Abnormal; Notable for the following:    Sodium 126 (*)    Chloride 91 (*)    BUN 58 (*)    Creatinine, Ser 1.90 (*)    Albumin 2.6 (*)    GFR calc non Af Amer 23 (*)    GFR calc Af Amer 27 (*)    All other components within normal limits  CBC WITH DIFFERENTIAL - Abnormal; Notable for the following:    RBC 1.84 (*)    Hemoglobin 6.0 (*)    HCT 18.2 (*)    RDW 16.2 (*)    Neutrophils Relative % 89 (*)    Lymphocytes Relative 6 (*)    Lymphs Abs 0.5 (*)     All other components within normal limits  CULTURE, BLOOD (ROUTINE X 2)  URINE CULTURE  LACTIC ACID, PLASMA  APTT  PROTIME-INR  TROPONIN I  URINALYSIS, ROUTINE W REFLEX MICROSCOPIC  TYPE AND SCREEN  PREPARE RBC (CROSSMATCH)   Dg Chest Port 1 View  11/29/2012   *RADIOLOGY REPORT*  Clinical Data: Shortness of breath.  PORTABLE CHEST - 1 VIEW 11/29/2012 1638 hours:  Comparison: One-view chest x-ray 05/04/2012.  Two-view chest x-ray 07/05/2010.  Findings: Cardiac silhouette normal in size for AP portable technique.  Mitral annular calcification.  Thoracic aorta atherosclerotic, unchanged.  Hilar and mediastinal contours otherwise unremarkable.  Emphysematous changes throughout both lungs.  Patchy airspace opacities in the right mid lung and the left lung base, new since the prior examination.  IMPRESSION: Pneumonia involving the right mid lung and left lung base, superimposed upon COPD/emphysema.   Original Report Authenticated By: Hulan Saas, M.D.     1. Shock   2. Atrial fibrillation with RVR   3. Hypovolemia   4. Acute renal failure   5. Blood loss anemia   6.  Community Acquired Pneumonia    MDM  The patient appears to be in atrial fibrillation with a rapid ventricular rate.  She has no obvious abnormal lung sounds but she does have a rapid tachypnea which appears shallow. According to her son who also if presents with her she has no history of atrial fibrillation but her EKG does show that he. 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 Rayna Sexton office in April of this year. I would be concerned for  a bladder infection or upper urinary infection, possibly hemorrhagic related hypotension and atrial fibrillation given amount of blood lost, primary arrhythmia would also be considered. Because of her initial  hypotension and dehydrated appearance fluid bolus has been initiated. She may also need to have rate control that this is not quickly fixed the problem. Last blood pressure measurement was 93/65. Afebrile. Blood work has finally been acquired as the patient is a very difficult phlebotomy access.  ED ECG REPORT  I personally interpreted this EKG   Date: 11/29/2012   Rate: 141  Rhythm: atrial fibrillation with RVR  QRS Axis: normal  Intervals: normal  ST/T Wave abnormalities: nonspecific T wave changes  Conduction Disutrbances:none  Narrative Interpretation:   Old EKG Reviewed: Compared with 05/05/2012, atrial fibrillation has replaced normal sinus rhythm.   Lab show significant anemia with a hemoglobin of 6, hematocrit of 18%, acute renal failure and significant dehydration. This is likely coming from the patient's urinary tract. Her platelets are normal. She will require blood transfusion immediately as she has significant tachycardia related to her significant anemia and hydration status.  I will consult the hospitalist for admission, she has required frequent re\re evaluations, cardiac monitoring, blood transfusion immediately and is critically ill from this disease process.  Chest x-ray as interpreted by myself shows a pneumonia superimposed on COPD. This is yet another reason why the patient may be tachypneic and in such distress. Last blood pressure was 99/61, she is persistently tachypneic but maintaining her oxygenation. She is continually in a rapid rate.  I discussed her care with the critical care physician at [facility] Dr. Vassie Loll who has accepted transfer of the patient to the intensive care unit.    CRITICAL CARE Performed by: [name] Total critical care time: 35 Critical care time was exclusive of separately billable procedures and treating other patients. Critical care was necessary to treat or prevent imminent or life-threatening deterioration. Critical care  was time spent personally by me on the following activities: development of treatment plan with patient and/or surrogate as well as nursing, discussions with consultants, evaluation of patient's response to treatment, examination of patient, obtaining history from patient or surrogate, ordering and performing treatments and interventions, ordering and review of laboratory studies, ordering and review of radiographic studies, pulse oximetry and re-evaluation of patient's condition.  Meds given in ED:  Ativan, Rocephin, Zithromax, Bolus of NS (Pt declined Morphine order)    I personally performed the services described in this documentation, which was scribed in my presence. The recorded information has been reviewed and is accurate.            Marissa Roller, MD 11/30/12 [phone]

## 2012-11-29 NOTE — ED Notes (Signed)
Pt blood in urine, lower abd pain x 2 weeks, hx of tumor on bladder, [facility] reports pt with BP 80/56

## 2012-11-29 NOTE — Procedures (Deleted)
Central Venous Catheter Insertion Procedure Note [name] [id number] 12-Jun-1926  Procedure: Insertion of Central Venous Catheter Indications: Drug and/or fluid administration  Procedure Details Consent: Risks of procedure as well as the alternatives and risks of each were explained to the (patient/caregiver).  Consent for procedure obtained. Time Out: Verified patient identification, verified procedure, site/side was marked, verified correct patient position, special equipment/implants available, medications/allergies/relevent history reviewed, required imaging and test results available.  Performed  Maximum sterile technique was used including antiseptics, cap, gloves, gown, hand hygiene, mask and sheet. Skin prep: Chlorhexidine; local anesthetic administered A antimicrobial bonded/coated triple lumen catheter was placed in the right internal jugular vein using the Seldinger technique.  Evaluation Blood flow good Complications: No apparent complications Patient did tolerate procedure well. Chest X-ray ordered to verify placement.  CXR: pending.  [name] 11/29/2012, 11:03 PM

## 2012-11-29 NOTE — ED Notes (Signed)
[facility] received report and ETA of 15-20 min.

## 2012-11-30 ENCOUNTER — Inpatient Hospital Stay (HOSPITAL_COMMUNITY): Payer: Medicare Other

## 2012-11-30 DIAGNOSIS — N390 Urinary tract infection, site not specified: Secondary | ICD-10-CM

## 2012-11-30 DIAGNOSIS — D494 Neoplasm of unspecified behavior of bladder: Secondary | ICD-10-CM

## 2012-11-30 DIAGNOSIS — I4891 Unspecified atrial fibrillation: Secondary | ICD-10-CM

## 2012-11-30 DIAGNOSIS — E039 Hypothyroidism, unspecified: Secondary | ICD-10-CM

## 2012-11-30 DIAGNOSIS — E861 Hypovolemia: Secondary | ICD-10-CM

## 2012-11-30 DIAGNOSIS — N179 Acute kidney failure, unspecified: Secondary | ICD-10-CM

## 2012-11-30 DIAGNOSIS — E46 Unspecified protein-calorie malnutrition: Secondary | ICD-10-CM

## 2012-11-30 DIAGNOSIS — J189 Pneumonia, unspecified organism: Secondary | ICD-10-CM

## 2012-11-30 DIAGNOSIS — R578 Other shock: Secondary | ICD-10-CM | POA: Diagnosis present

## 2012-11-30 DIAGNOSIS — G7241 Inclusion body myositis [IBM]: Secondary | ICD-10-CM

## 2012-11-30 DIAGNOSIS — R319 Hematuria, unspecified: Secondary | ICD-10-CM

## 2012-11-30 LAB — TYPE AND SCREEN
ABO/RH(D): O POS
ABO/RH(D): O POS
Antibody Screen: NEGATIVE
Unit division: 0

## 2012-11-30 LAB — CBC
HCT: 24.9 % — ABNORMAL LOW (ref 36.0–46.0)
Hemoglobin: 8.4 g/dL — ABNORMAL LOW (ref 12.0–15.0)
MCH: 30.8 pg (ref 26.0–34.0)
MCH: 32.3 pg (ref 26.0–34.0)
MCHC: 33.7 g/dL (ref 30.0–36.0)
MCV: 91.4 fL (ref 78.0–100.0)
Platelets: 240 10*3/uL (ref 150–400)
RBC: 2.73 MIL/uL — ABNORMAL LOW (ref 3.87–5.11)
RDW: 17.8 % — ABNORMAL HIGH (ref 11.5–15.5)
WBC: 8.7 10*3/uL (ref 4.0–10.5)

## 2012-11-30 LAB — CBC WITH DIFFERENTIAL/PLATELET
Basophils Relative: 0 % (ref 0–1)
Eosinophils Absolute: 0 10*3/uL (ref 0.0–0.7)
Eosinophils Relative: 0 % (ref 0–5)
HCT: 25.1 % — ABNORMAL LOW (ref 36.0–46.0)
Hemoglobin: 8.6 g/dL — ABNORMAL LOW (ref 12.0–15.0)
Lymphs Abs: 1 10*3/uL (ref 0.7–4.0)
MCH: 31.2 pg (ref 26.0–34.0)
MCHC: 34.3 g/dL (ref 30.0–36.0)
MCV: 90.9 fL (ref 78.0–100.0)
Monocytes Absolute: 0.6 10*3/uL (ref 0.1–1.0)
Monocytes Relative: 6 % (ref 3–12)
RBC: 2.76 MIL/uL — ABNORMAL LOW (ref 3.87–5.11)

## 2012-11-30 LAB — FERRITIN: Ferritin: 919 ng/mL — ABNORMAL HIGH (ref 10–291)

## 2012-11-30 LAB — BASIC METABOLIC PANEL
BUN: 44 mg/dL — ABNORMAL HIGH (ref 6–23)
CO2: 20 mEq/L (ref 19–32)
Calcium: 7.7 mg/dL — ABNORMAL LOW (ref 8.4–10.5)
GFR calc non Af Amer: 32 mL/min — ABNORMAL LOW (ref >90)
Glucose, Bld: 80 mg/dL (ref 70–99)
Potassium: 4.2 mEq/L (ref 3.5–5.1)
Sodium: 133 mEq/L — ABNORMAL LOW (ref 135–145)

## 2012-11-30 LAB — COMPREHENSIVE METABOLIC PANEL
ALT: 11 U/L (ref 0–35)
AST: 22 U/L (ref 0–37)
Albumin: 2.3 g/dL — ABNORMAL LOW (ref 3.5–5.2)
Alkaline Phosphatase: 73 U/L (ref 39–117)
BUN: 46 mg/dL — ABNORMAL HIGH (ref 6–23)
Chloride: 99 mEq/L (ref 96–112)
Potassium: 4.5 mEq/L (ref 3.5–5.1)
Sodium: 132 mEq/L — ABNORMAL LOW (ref 135–145)
Total Bilirubin: 0.5 mg/dL (ref 0.3–1.2)

## 2012-11-30 LAB — TROPONIN I: Troponin I: <0.3 ng/mL (ref <0.30)

## 2012-11-30 LAB — IRON AND TIBC
Iron: 152 ug/dL — ABNORMAL HIGH (ref 42–135)
UIBC: 31 ug/dL — ABNORMAL LOW (ref 125–400)

## 2012-11-30 LAB — CK: Total CK: 134 U/L (ref 7–177)

## 2012-11-30 MED ORDER — IPRATROPIUM BROMIDE 0.02 % IN SOLN: 0.5000 mg | RESPIRATORY_TRACT | Status: DC | PRN

## 2012-11-30 MED ORDER — TRAMADOL HCL 50 MG PO TABS
50.0000 mg | ORAL_TABLET | Freq: Three times a day (TID) | ORAL | Status: DC | PRN
  Administered 2012-11-30: 50 mg via ORAL
  Filled 2012-11-30 (×2): qty 1

## 2012-11-30 MED ORDER — PANTOPRAZOLE SODIUM 40 MG IV SOLR
40.0000 mg | INTRAVENOUS | Status: DC
  Administered 2012-11-30 – 2012-12-03 (×4): 40 mg via INTRAVENOUS
  Filled 2012-11-30 (×6): qty 40

## 2012-11-30 MED ORDER — METOPROLOL TARTRATE 1 MG/ML IV SOLN
2.5000 mg | INTRAVENOUS | Status: DC | PRN
  Administered 2012-11-30: 2.5 mg via INTRAVENOUS
  Administered 2012-12-01: 5 mg via INTRAVENOUS
  Administered 2012-12-02: 2.5 mg via INTRAVENOUS
  Administered 2012-12-02 (×2): 5 mg via INTRAVENOUS
  Administered 2012-12-02 – 2012-12-03 (×2): 2.5 mg via INTRAVENOUS
  Filled 2012-11-30 (×7): qty 5

## 2012-11-30 MED ORDER — ALBUTEROL SULFATE (5 MG/ML) 0.5% IN NEBU: 2.5000 mg | INHALATION_SOLUTION | RESPIRATORY_TRACT | Status: DC | PRN

## 2012-11-30 MED ORDER — HYDROCORTISONE SOD SUCCINATE 100 MG IJ SOLR
100.0000 mg | Freq: Three times a day (TID) | INTRAMUSCULAR | Status: DC
  Administered 2012-11-30 (×2): 100 mg via INTRAVENOUS
  Administered 2012-12-01: 12:00:00 via INTRAVENOUS
  Administered 2012-12-01 – 2012-12-02 (×3): 100 mg via INTRAVENOUS
  Filled 2012-11-30 (×9): qty 2

## 2012-11-30 MED ORDER — FENTANYL CITRATE 0.05 MG/ML IJ SOLN
25.0000 ug | INTRAMUSCULAR | Status: DC | PRN
  Administered 2012-11-30 – 2012-12-01 (×3): 25 ug via INTRAVENOUS
  Filled 2012-11-30 (×3): qty 2

## 2012-11-30 MED ORDER — IOHEXOL 300 MG/ML  SOLN
25.0000 mL | INTRAMUSCULAR | Status: AC
  Administered 2012-11-30 (×2): 25 mL via ORAL

## 2012-11-30 MED ORDER — SODIUM CHLORIDE 0.9 % IV SOLN
INTRAVENOUS | Status: DC
  Administered 2012-11-30 (×2): via INTRAVENOUS
  Administered 2012-12-01: 1000 mL via INTRAVENOUS
  Administered 2012-12-01 – 2012-12-03 (×2): via INTRAVENOUS

## 2012-11-30 MED ORDER — LEVOTHYROXINE SODIUM 100 MCG IV SOLR
56.0000 ug | Freq: Every day | INTRAVENOUS | Status: DC
  Administered 2012-12-01 – 2012-12-02 (×2): 56 ug via INTRAVENOUS
  Filled 2012-11-30 (×4): qty 5

## 2012-11-30 MED ORDER — SODIUM CHLORIDE 0.9 % IV BOLUS (SEPSIS)
500.0000 mL | Freq: Once | INTRAVENOUS | Status: AC
  Administered 2012-11-30: 500 mL via INTRAVENOUS

## 2012-11-30 NOTE — H&P (Addendum)
PULMONARY  / CRITICAL CARE MEDICINE H&P   Name: THERSA MOHIUDDIN MRN: [id number] DOB: 1926-01-13    ADMISSION DATE:  11/29/2012  PRIMARY SERVICE: PCCM  CHIEF COMPLAINT:  Hematuria  BRIEF PATIENT DESCRIPTION:  79 yo female presented to ED with progressive hematuria, abdominal pain, and hypotension with A fib and RVR, severe anemia, and concern for pneumonia.  PCCM asked to admit to ICU.  She has been referred to urology as outpt, but not yet evaluated for concerns of bladder cancer.  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  Denies chest pain, dyspnea.  Abdominal pain improved.  Denies dizziness.  VITAL SIGNS: Temp:  [97.8 F (36.6 C)-98.7 F (37.1 C)] 97.8 F (36.6 C) (05/26 0815) Pulse Rate:  [25-150] 96 (05/26 0759) Resp:  [13-49] 13 (05/26 0759) BP: (63-106)/(34-74) 100/64 mmHg (05/26 0759) SpO2:  [91 %-100 %] 97 % (05/26 0759) Weight:  [126 lb 5.2 oz (57.3 kg)] 126 lb 5.2 oz (57.3 kg) (05/26 0000)   3 liters [place]  INTAKE / OUTPUT: Intake/Output     05/25 0701 - 05/26 0700 05/26 0701 - 05/27 0700   I.V. (mL/kg) 860 (15)    Other 650    IV Piggyback 850    Total Intake(mL/kg) 2360 (41.2)    Urine (mL/kg/hr) 8    Total Output 8     Net +2352            PHYSICAL EXAMINATION: General:  Il appearing Neuro: alert, follows commands, normal strength HEENT:  Pale sclera, dry oral mucosa Cardiovascular: irregular Lungs: scattered rhonchi Abdomen: soft, non tender, decreased bowel sounds Musculoskeletal: decreased muscle mass Skin:  No rash  LABS:  Recent Labs Lab 11/29/12 1650 11/29/12 2330 11/30/12 0341  HGB 6.0* 8.6* 8.4*  WBC 8.5 8.7 8.1   PLT 340 255 255  NA 126* 132* 133*  K 5.1 4.5 4.2  CL 91* 99 102  CO2 21 20 20   GLUCOSE 90 73 80  BUN 58* 46* 44*  CREATININE 1.90* 1.53* 1.45*  CALCIUM 8.9 7.9* 7.7*  AST 31 22  --   ALT 13 11  --   ALKPHOS 84 73  --   BILITOT 0.3 0.5  --   PROT 6.4 5.8*  --   ALBUMIN 2.6* 2.3*  --   APTT 33 34  --   INR 0.98 0.97  --   LATICACIDVEN 1.8  --   --   TROPONINI <0.30 <0.30 <0.30     Imaging: Dg Chest Port 1 View  11/29/2012   *RADIOLOGY REPORT*  Clinical Data: Shortness of breath.  PORTABLE CHEST - 1 VIEW 11/29/2012 1638 hours:  Comparison: One-view chest x-ray 05/04/2012.  Two-view chest x-ray 07/05/2010.  Findings: Cardiac silhouette normal in size for AP portable technique.  Mitral annular calcification.  Thoracic aorta atherosclerotic, unchanged.  Hilar and mediastinal contours otherwise unremarkable.  Emphysematous changes throughout both lungs.  Patchy airspace opacities in the right mid lung and the left lung base, new since the prior examination.  IMPRESSION: Pneumonia involving the right mid lung and left lung base, superimposed upon COPD/emphysema.   Original Report Authenticated By: Hulan Saas, M.D.  Dg Abd Portable 2v  11/30/2012   *RADIOLOGY REPORT*  Clinical Data: Abdominal pain  PORTABLE ABDOMEN - 2 VIEW  Comparison: 05/04/2012; chest radiograph - 05/01/2013  Findings:  Nonobstructive bowel gas pattern.  No pneumoperitoneum, pneumatosis or portal venous gas.  No definite abnormal intra-abdominal calcifications.  Post bilateral total hip replacements, incompletely evaluated. Multilevel lumbar spine degenerative changes with associated presumably degenerative scoliotic curvature of the lumbar spine, convex to the left.  IMPRESSION: Nonobstructive bowel gas pattern.   Original Report Authenticated By: Tacey Ruiz, MD     ASSESSMENT / PLAN:  PULMONARY A: Acute hypoxic respiratory failure with pulmonary infiltrates concerning for PNA.  She is on chronic  immunosuppression for inclusion body myositis. P:   -f/u CT chest 5/26 -oxygen to keep SpO2 > 92%  CARDIOVASCULAR A: A fib with RVR in setting of hemorrhagic/hypovolemic shock. P:  -continue volume resuscitation -hold ASA due to hematuria -hold lopressor while hypotensive -monitor heart rhythm on tele  RENAL A:  Acute kidney injury 2nd to hypovolemia. Hematuria with concern for bladder cancer. P:   -continue IV fluids at 100 ml/hr -keep foley in -will need urology consult >> in review of medical records pt was referred to Dr. Jerre Simon, urology in [place].  I am not sure if she has been seen by urology yet. -f/u CT abd/pelvis 5/26 -monitor renal fx, urine outpt, electrolytes   GASTROINTESTINAL A:  Nausea >> improved 5/26. Protein calorie malnutrition. P:   -NPO for now -PRN zofran -Protonix IV for SUP  HEMATOLOGIC A: Acute hemorrhagic anemia from hematuria. P:  -f/u CBC -transfuse for Hb < 7, or hemodynamic instability with active bleeding  INFECTIOUS A: Pulmonary infiltrates with concern for PNA. Possible UTI. P:   -Continue cefepime and azithromycin -hold imuran due to concern for infection  ENDOCRINE A:  Relative adrenal insufficiency with hx of chronic prednisone use. Hx of hypothyroid. P:   -add solucortef 5/26 -continue synthroid  RHEUMATOLOGY A: Hx of inclusion body myositis. P: -hold imuran, prednisone  NEUROLOGIC A: Hx of anxiety, chronic pain. P:   -PRN ativan -continue lidoderm -hold xanax, remeron for now  CC time 45 minutes.  Coralyn Helling, MD [facility] Pulmonary/Critical Care 11/30/2012, 9:54 AM Pager:  [phone] After 3pm call: [phone]

## 2012-11-30 NOTE — Progress Notes (Signed)
CARE MANAGEMENT NOTE 11/30/2012  Patient:  [name], [name]   Account Number:  [id number]  Date Initiated:  11/30/2012  Documentation initiated by:  DAVIS,RHONDA  Subjective/Objective Assessment:   pna, hypotensive, anemic, gross hematuria     Action/Plan:   private resident children are the support group   Anticipated DC Date:  12/03/2012   Anticipated DC Plan:  HOME/SELF CARE  In-house referral  NA      DC Planning Services  NA      [facility] Choice  NA   Choice offered to / List presented to:  NA   DME arranged  NA      DME agency  NA     HH arranged  NA      HH agency  NA   Status of service:  In process, will continue to follow Medicare Important Message given?  NA - LOS <3 / Initial given by admissions (If response is "NO", the following Medicare IM given date fields will be blank) Date Medicare IM given:   Date Additional Medicare IM given:    Discharge Disposition:    Per UR Regulation:  Reviewed for med. necessity/level of care/duration of stay  If discussed at Long Length of Stay Meetings, dates discussed:    Comments:  [id number] Earlene Plater, RN, BSN,CCM: CHART REVIEWED AND UPDATED.  Next chart review due on [id number]. NO DISCHARGE NEEDS PRESENT AT THIS TIME. CASE MANAGEMENT [phone]

## 2012-11-30 NOTE — Progress Notes (Signed)
Pt asked to "please have something to eat, I haven't eaten for 4 days."  Had been NPO for tests.  Orders received for clear liquids to full.  After giving 3 small bites of sherbert, while pt was sitting up, she started coughing and clearing throat.  I instructed her that she was to have nothing else for the evening and MD notified.

## 2012-11-30 NOTE — Progress Notes (Signed)
eLink Physician-Brief Progress Note Patient Name: [name] DOB: 03/07/1926 MRN: [id number]  Date of Service  11/30/2012   HPI/Events of Note   AF-RVR, chronic  eICU Interventions  Dc albuterol Lopressor with parameters (home med)   Intervention Category Intermediate Interventions: Arrhythmia - evaluation and management  ALVA,RAKESH V. 11/30/2012, 8:46 PM

## 2012-11-30 NOTE — Consult Note (Signed)
Urology Consult  Referring physician: Dr Sood/ICU Reason for referral: Gross hematuria  Chief Complaint: Gross hematuria  History of Present Illness: Admitted with pneumonia/hematuria/sepsis and atrial fib on ASA; sever anemia; followed in [facility]; Hb 6-8.4 today; Cr decreased to 1.45; on chronic immunosuppression; urine c/s normal;  No Hx of GU surgery/UTI;s/smoking/; normally voids q 2 hours and nocturia x2 Modifying factors: There are no other modifying factors  Associated signs and symptoms: There are no other associated signs and symptoms Aggravating and relieving factors: There are no other aggravating or relieving factors Severity: Mild Duration: Persistent  Past Medical History  Diagnosis Date  . Hypothyroidism   . Atrial fibrillation   . Myositis     BODY  . LVH (left ventricular hypertrophy)     MILD  . Dizziness   . Headache   . Weakness   . Chest pain   . DOE (dyspnea on exertion)   . Poor appetite   . UTI (urinary tract infection)   . Dysphagia     History of dysphagia 2, depressed by esophagus status post dilation  . Arthritis   . Hypokalemia   . History of anxiety   . Orthostatic hypotension   . Inclusion body myositis   . Easy bruising   . Complication of anesthesia     anxiety before anesthesia, wants spinal  . Myalgia and myositis, unspecified 04/30/2011  . Malnutrition/Cachexia 05/05/2012    Pre-morbid body weight 150 lbs, 04/2012 <100 lbs   . Failure to thrive 05/05/2012  . Macrocytosis 05/05/2012  . Diarrhea 05/05/2012  . Anxiety    Past Surgical History  Procedure Laterality Date  . Total hip arthroplasty  2000 or 2001    right  . Transthoracic echocardiogram  07/08/10    EF 55-60%  . Left total hip arthroplasty  1996  . Total hip revision  12/18/2011    Procedure: TOTAL HIP REVISION;  Surgeon: Loanne Drilling, MD;  Location: [facility];  Service: Orthopedics;  Laterality: Right;  Right Hip Revision to Constrained Liner-acetabular   .  Flexible sigmoidoscopy  05/07/2012    Procedure: FLEXIBLE SIGMOIDOSCOPY;  Surgeon: Malissa Hippo, MD;  Location: [facility];  Service: Endoscopy;  Laterality: N/A;  . Partial hysterectomy N/A     distant history, type unknown    Medications: I have reviewed the patient's current medications. Allergies:  Allergies  Allergen Reactions  . Codeine Other (See Comments)    unknown  . Morphine And Related Other (See Comments)    unknown  . Piroxicam Other (See Comments)    unknown    History reviewed. No pertinent family history. Social History:  reports that she has never smoked. She has never used smokeless tobacco. She reports that she does not drink alcohol or use illicit drugs.  ROS: All systems are reviewed and negative except as noted. Rest ROS negative  Physical Exam:  Vital signs in last 24 hours: Temp:  [97.8 F (36.6 C)-98.7 F (37.1 C)] 97.8 F (36.6 C) (05/26 0815) Pulse Rate:  [25-150] 117 (05/26 0850) Resp:  [13-49] 26 (05/26 0850) BP: (63-112)/(34-80) 92/60 mmHg (05/26 0850) SpO2:  [91 %-100 %] 100 % (05/26 0850) Weight:  [57.3 kg (126 lb 5.2 oz)] 57.3 kg (126 lb 5.2 oz) (05/26 0000)  Cardiovascular: Skin warm; not flushed Respiratory: Breaths quiet; no shortness of breath Abdomen: No masses Neurological: Normal sensation to touch Musculoskeletal: Normal motor function arms and legs Lymphatics: No inguinal adenopathy Skin: No rashes Genitourinary:belly soft;  mild blood inurine dark  Laboratory Data:  Results for orders placed during the hospital encounter of 11/29/12 (from the past 72 hour(s))  CULTURE, BLOOD (ROUTINE X 2)     Status: None   Collection Time    11/29/12  4:45 PM      Result Value Range   Specimen Description BLOOD LEFT ARM     Special Requests       Value: BOTTLES DRAWN AEROBIC AND ANAEROBIC 6CC EACH BOTTLE   Culture NO GROWTH 1 DAY     Report Status PENDING    URINALYSIS, ROUTINE W REFLEX MICROSCOPIC     Status: Abnormal    Collection Time    11/29/12  4:49 PM      Result Value Range   Color, Urine BROWN (*) YELLOW   Comment: BIOCHEMICALS MAY BE AFFECTED BY COLOR   APPearance HAZY (*) CLEAR   Specific Gravity, Urine 1.010  1.005 - 1.030   pH 8.0  5.0 - 8.0   Glucose, UA NEGATIVE  NEGATIVE mg/dL   Hgb urine dipstick LARGE (*) NEGATIVE   Bilirubin Urine LARGE (*) NEGATIVE   Ketones, ur >80 (*) NEGATIVE mg/dL   Protein, ur >846 (*) NEGATIVE mg/dL   Urobilinogen, UA 1.0  0.0 - 1.0 mg/dL   Nitrite POSITIVE (*) NEGATIVE   Leukocytes, UA LARGE (*) NEGATIVE  URINE MICROSCOPIC-ADD ON     Status: Abnormal   Collection Time    11/29/12  4:49 PM      Result Value Range   WBC, UA TOO NUMEROUS TO COUNT  <3 WBC/hpf   RBC / HPF TOO NUMEROUS TO COUNT  <3 RBC/hpf   Bacteria, UA MANY (*) RARE  LACTIC ACID, PLASMA     Status: None   Collection Time    11/29/12  4:50 PM      Result Value Range   Lactic Acid, Venous 1.8  0.5 - 2.2 mmol/L  COMPREHENSIVE METABOLIC PANEL     Status: Abnormal   Collection Time    11/29/12  4:50 PM      Result Value Range   Sodium 126 (*) 135 - 145 mEq/L   Potassium 5.1  3.5 - 5.1 mEq/L   Chloride 91 (*) 96 - 112 mEq/L   CO2 21  19 - 32 mEq/L   Glucose, Bld 90  70 - 99 mg/dL   BUN 58 (*) 6 - 23 mg/dL   Creatinine, Ser 9.62 (*) 0.50 - 1.10 mg/dL   Calcium 8.9  8.4 - 95.2 mg/dL   Total Protein 6.4  6.0 - 8.3 g/dL   Albumin 2.6 (*) 3.5 - 5.2 g/dL   AST 31  0 - 37 U/L   ALT 13  0 - 35 U/L   Alkaline Phosphatase 84  39 - 117 U/L   Total Bilirubin 0.3  0.3 - 1.2 mg/dL   GFR calc non Af Amer 23 (*) >90 mL/min   GFR calc Af Amer 27 (*) >90 mL/min   Comment:            The eGFR has been calculated     using the CKD EPI equation.     This calculation has not been     validated in all clinical     situations.     eGFR's persistently     <90 mL/min signify     possible Chronic Kidney Disease.  CBC WITH DIFFERENTIAL     Status: Abnormal   Collection Time  11/29/12  4:50 PM       Result Value Range   WBC 8.5  4.0 - 10.5 K/uL   RBC 1.84 (*) 3.87 - 5.11 MIL/uL   Hemoglobin 6.0 (*) 12.0 - 15.0 g/dL   Comment: CRITICAL RESULT CALLED TO, READ BACK BY AND VERIFIED WITH:     DANIELS B. AT 1704 ON [id number] BY THOMPSON S.   HCT 18.2 (*) 36.0 - 46.0 %   MCV 98.9  78.0 - 100.0 fL   MCH 32.6  26.0 - 34.0 pg   MCHC 33.0  30.0 - 36.0 g/dL   RDW 11.9 (*) 14.7 - 82.9 %   Platelets 340  150 - 400 K/uL   Neutrophils Relative % 89 (*) 43 - 77 %   Lymphocytes Relative 6 (*) 12 - 46 %   Monocytes Relative 5  3 - 12 %   Eosinophils Relative 0  0 - 5 %   Basophils Relative 0  0 - 1 %   Neutro Abs 7.6  1.7 - 7.7 K/uL   Lymphs Abs 0.5 (*) 0.7 - 4.0 K/uL   Monocytes Absolute 0.4  0.1 - 1.0 K/uL   Eosinophils Absolute 0.0  0.0 - 0.7 K/uL   Basophils Absolute 0.0  0.0 - 0.1 K/uL  APTT     Status: None   Collection Time    11/29/12  4:50 PM      Result Value Range   aPTT 33  24 - 37 seconds  PROTIME-INR     Status: None   Collection Time    11/29/12  4:50 PM      Result Value Range   Prothrombin Time 12.9  11.6 - 15.2 seconds   INR 0.98  0.00 - 1.49  TROPONIN I     Status: None   Collection Time    11/29/12  4:50 PM      Result Value Range   Troponin I <0.30  <0.30 ng/mL   Comment:            Due to the release kinetics of cTnI,     a negative result within the first hours     of the onset of symptoms does not rule out     myocardial infarction with certainty.     If myocardial infarction is still suspected,     repeat the test at appropriate intervals.  TYPE AND SCREEN     Status: None   Collection Time    11/29/12  5:32 PM      Result Value Range   ABO/RH(D) O POS     Antibody Screen NEG     Sample Expiration 12/02/2012     Unit Number [id number]     Blood Component Type RED CELLS,LR     Unit division 00     Status of Unit ISSUED,FINAL     Transfusion Status OK TO TRANSFUSE     Crossmatch Result Compatible     Unit Number [id number]     Blood Component  Type RED CELLS,LR     Unit division 00     Status of Unit ISSUED,FINAL     Transfusion Status OK TO TRANSFUSE     Crossmatch Result Compatible    PREPARE RBC (CROSSMATCH)     Status: None   Collection Time    11/29/12  6:00 PM      Result Value Range   Order Confirmation ORDER PROCESSED BY BLOOD BANK    ABO/RH  Status: None   Collection Time    11/29/12  6:10 PM      Result Value Range   ABO/RH(D) O POS    MRSA PCR SCREENING     Status: None   Collection Time    11/29/12  8:28 PM      Result Value Range   MRSA by PCR NEGATIVE  NEGATIVE   Comment:            The GeneXpert MRSA Assay (FDA     approved for NASAL specimens     only), is one component of a     comprehensive MRSA colonization     surveillance program. It is not     intended to diagnose MRSA     infection nor to guide or     monitor treatment for     MRSA infections.  COMPREHENSIVE METABOLIC PANEL     Status: Abnormal   Collection Time    11/29/12 11:30 PM      Result Value Range   Sodium 132 (*) 135 - 145 mEq/L   Potassium 4.5  3.5 - 5.1 mEq/L   Chloride 99  96 - 112 mEq/L   CO2 20  19 - 32 mEq/L   Glucose, Bld 73  70 - 99 mg/dL   BUN 46 (*) 6 - 23 mg/dL   Creatinine, Ser 9.60 (*) 0.50 - 1.10 mg/dL   Calcium 7.9 (*) 8.4 - 10.5 mg/dL   Total Protein 5.8 (*) 6.0 - 8.3 g/dL   Albumin 2.3 (*) 3.5 - 5.2 g/dL   AST 22  0 - 37 U/L   ALT 11  0 - 35 U/L   Alkaline Phosphatase 73  39 - 117 U/L   Total Bilirubin 0.5  0.3 - 1.2 mg/dL   GFR calc non Af Amer 30 (*) >90 mL/min   GFR calc Af Amer 35 (*) >90 mL/min   Comment:            The eGFR has been calculated     using the CKD EPI equation.     This calculation has not been     validated in all clinical     situations.     eGFR's persistently     <90 mL/min signify     possible Chronic Kidney Disease.  TROPONIN I     Status: None   Collection Time    11/29/12 11:30 PM      Result Value Range   Troponin I <0.30  <0.30 ng/mL   Comment:            Due  to the release kinetics of cTnI,     a negative result within the first hours     of the onset of symptoms does not rule out     myocardial infarction with certainty.     If myocardial infarction is still suspected,     repeat the test at appropriate intervals.  CBC WITH DIFFERENTIAL     Status: Abnormal   Collection Time    11/29/12 11:30 PM      Result Value Range   WBC 8.7  4.0 - 10.5 K/uL   RBC 2.76 (*) 3.87 - 5.11 MIL/uL   Hemoglobin 8.6 (*) 12.0 - 15.0 g/dL   HCT 45.4 (*) 09.8 - 11.9 %   MCV 90.9  78.0 - 100.0 fL   MCH 31.2  26.0 - 34.0 pg   MCHC 34.3  30.0 - 36.0 g/dL  RDW 17.3 (*) 11.5 - 15.5 %   Platelets 255  150 - 400 K/uL   Neutrophils Relative % 82 (*) 43 - 77 %   Neutro Abs 7.1  1.7 - 7.7 K/uL   Lymphocytes Relative 11 (*) 12 - 46 %   Lymphs Abs 1.0  0.7 - 4.0 K/uL   Monocytes Relative 6  3 - 12 %   Monocytes Absolute 0.6  0.1 - 1.0 K/uL   Eosinophils Relative 0  0 - 5 %   Eosinophils Absolute 0.0  0.0 - 0.7 K/uL   Basophils Relative 0  0 - 1 %   Basophils Absolute 0.0  0.0 - 0.1 K/uL  APTT     Status: None   Collection Time    11/29/12 11:30 PM      Result Value Range   aPTT 34  24 - 37 seconds  PROTIME-INR     Status: None   Collection Time    11/29/12 11:30 PM      Result Value Range   Prothrombin Time 12.8  11.6 - 15.2 seconds   INR 0.97  0.00 - 1.49  TSH     Status: Abnormal   Collection Time    11/29/12 11:30 PM      Result Value Range   TSH 16.481 (*) 0.350 - 4.500 uIU/mL  CK     Status: None   Collection Time    11/29/12 11:30 PM      Result Value Range   Total CK 134  7 - 177 U/L  TYPE AND SCREEN     Status: None   Collection Time    11/29/12 11:30 PM      Result Value Range   ABO/RH(D) O POS     Antibody Screen NEG     Sample Expiration 12/02/2012    TROPONIN I     Status: None   Collection Time    11/30/12  3:41 AM      Result Value Range   Troponin I <0.30  <0.30 ng/mL   Comment:            Due to the release kinetics of cTnI,      a negative result within the first hours     of the onset of symptoms does not rule out     myocardial infarction with certainty.     If myocardial infarction is still suspected,     repeat the test at appropriate intervals.  CBC     Status: Abnormal   Collection Time    11/30/12  3:41 AM      Result Value Range   WBC 8.1  4.0 - 10.5 K/uL   RBC 2.73 (*) 3.87 - 5.11 MIL/uL   Hemoglobin 8.4 (*) 12.0 - 15.0 g/dL   HCT 09.8 (*) 11.9 - 14.7 %   MCV 91.2  78.0 - 100.0 fL   MCH 30.8  26.0 - 34.0 pg   MCHC 33.7  30.0 - 36.0 g/dL   RDW 82.9 (*) 56.2 - 13.0 %   Platelets 255  150 - 400 K/uL  BASIC METABOLIC PANEL     Status: Abnormal   Collection Time    11/30/12  3:41 AM      Result Value Range   Sodium 133 (*) 135 - 145 mEq/L   Potassium 4.2  3.5 - 5.1 mEq/L   Chloride 102  96 - 112 mEq/L   CO2 20  19 - 32 mEq/L  Glucose, Bld 80  70 - 99 mg/dL   BUN 44 (*) 6 - 23 mg/dL   Creatinine, Ser 7.82 (*) 0.50 - 1.10 mg/dL   Calcium 7.7 (*) 8.4 - 10.5 mg/dL   GFR calc non Af Amer 32 (*) >90 mL/min   GFR calc Af Amer 37 (*) >90 mL/min   Comment:            The eGFR has been calculated     using the CKD EPI equation.     This calculation has not been     validated in all clinical     situations.     eGFR's persistently     <90 mL/min signify     possible Chronic Kidney Disease.  FERRITIN     Status: Abnormal   Collection Time    11/30/12  3:41 AM      Result Value Range   Ferritin 919 (*) 10 - 291 ng/mL  CBC     Status: Abnormal   Collection Time    11/30/12 10:15 AM      Result Value Range   WBC 8.7  4.0 - 10.5 K/uL   RBC 2.66 (*) 3.87 - 5.11 MIL/uL   Hemoglobin 8.6 (*) 12.0 - 15.0 g/dL   HCT 95.6 (*) 21.3 - 08.6 %   MCV 91.4  78.0 - 100.0 fL   MCH 32.3  26.0 - 34.0 pg   MCHC 35.4  30.0 - 36.0 g/dL   RDW 57.8 (*) 46.9 - 62.9 %   Platelets 240  150 - 400 K/uL   Recent Results (from the past 240 hour(s))  CULTURE, BLOOD (ROUTINE X 2)     Status: None   Collection Time     11/29/12  4:45 PM      Result Value Range Status   Specimen Description BLOOD LEFT ARM   Final   Special Requests     Final   Value: BOTTLES DRAWN AEROBIC AND ANAEROBIC 6CC EACH BOTTLE   Culture NO GROWTH 1 DAY   Final   Report Status PENDING   Incomplete  MRSA PCR SCREENING     Status: None   Collection Time    11/29/12  8:28 PM      Result Value Range Status   MRSA by PCR NEGATIVE  NEGATIVE Final   Comment:            The GeneXpert MRSA Assay (FDA     approved for NASAL specimens     only), is one component of a     comprehensive MRSA colonization     surveillance program. It is not     intended to diagnose MRSA     infection nor to guide or     monitor treatment for     MRSA infections.   Creatinine:  Recent Labs  11/29/12 1650 11/29/12 2330 11/30/12 0341  CREATININE 1.90* 1.53* 1.45*    Xrays: See report/chart CT scan today  Impression/Assessment:  Gross hematuria   Plan:  Recommend CT scan (will see today and follow) F/up as outpt with her local urologist for cystoscopy  Tenya Araque A 11/30/2012, 10:29 AM

## 2012-12-01 DIAGNOSIS — R579 Shock, unspecified: Secondary | ICD-10-CM

## 2012-12-01 DIAGNOSIS — D500 Iron deficiency anemia secondary to blood loss (chronic): Secondary | ICD-10-CM

## 2012-12-01 LAB — COMPREHENSIVE METABOLIC PANEL
ALT: 12 U/L (ref 0–35)
AST: 25 U/L (ref 0–37)
Albumin: 2.1 g/dL — ABNORMAL LOW (ref 3.5–5.2)
Alkaline Phosphatase: 71 U/L (ref 39–117)
Potassium: 4 mEq/L (ref 3.5–5.1)
Sodium: 133 mEq/L — ABNORMAL LOW (ref 135–145)
Total Protein: 5.4 g/dL — ABNORMAL LOW (ref 6.0–8.3)

## 2012-12-01 LAB — CBC
Hemoglobin: 8.4 g/dL — ABNORMAL LOW (ref 12.0–15.0)
MCHC: 33.1 g/dL (ref 30.0–36.0)
RDW: 17.7 % — ABNORMAL HIGH (ref 11.5–15.5)
WBC: 6 10*3/uL (ref 4.0–10.5)

## 2012-12-01 MED ORDER — HYDROMORPHONE HCL PF 1 MG/ML IJ SOLN
1.0000 mg | INTRAMUSCULAR | Status: DC | PRN
  Administered 2012-12-01: 1 mg via INTRAVENOUS
  Filled 2012-12-01: qty 1

## 2012-12-01 MED ORDER — LORAZEPAM 0.5 MG PO TABS: 0.5000 mg | ORAL_TABLET | Freq: Two times a day (BID) | ORAL | Status: DC | PRN

## 2012-12-01 MED ORDER — OXYBUTYNIN CHLORIDE 5 MG PO TABS
5.0000 mg | ORAL_TABLET | Freq: Three times a day (TID) | ORAL | Status: DC | PRN
  Administered 2012-12-01: 5 mg via ORAL
  Filled 2012-12-01: qty 1

## 2012-12-01 MED ORDER — LORAZEPAM 2 MG/ML IJ SOLN
0.5000 mg | Freq: Once | INTRAMUSCULAR | Status: AC
  Administered 2012-12-01: 0.5 mg via INTRAVENOUS
  Filled 2012-12-01: qty 1

## 2012-12-01 MED ORDER — ALPRAZOLAM 0.5 MG PO TABS
0.5000 mg | ORAL_TABLET | Freq: Two times a day (BID) | ORAL | Status: DC | PRN
  Administered 2012-12-01: 0.5 mg via ORAL
  Filled 2012-12-01: qty 1

## 2012-12-01 MED ORDER — OXYBUTYNIN CHLORIDE 5 MG PO TABS
5.0000 mg | ORAL_TABLET | Freq: Four times a day (QID) | ORAL | Status: DC
  Administered 2012-12-01 – 2012-12-04 (×10): 5 mg via ORAL
  Filled 2012-12-01 (×13): qty 1

## 2012-12-01 MED ORDER — FENTANYL CITRATE 0.05 MG/ML IJ SOLN
25.0000 ug | INTRAMUSCULAR | Status: DC | PRN
  Administered 2012-12-01 (×3): 50 ug via INTRAVENOUS
  Administered 2012-12-01: 75 ug via INTRAVENOUS
  Administered 2012-12-01: 25 ug via INTRAVENOUS
  Filled 2012-12-01 (×5): qty 2

## 2012-12-01 NOTE — Progress Notes (Signed)
PULMONARY  / CRITICAL CARE MEDICINE H&P   Name: [name] MRN: [id number] DOB: 1926-05-02    ADMISSION DATE:  11/29/2012  PRIMARY SERVICE: PCCM  CHIEF COMPLAINT:  Hematuria  BRIEF PATIENT DESCRIPTION:  79 yo female presented to ED with progressive hematuria, abdominal pain, and hypotension with A fib and RVR, severe anemia, and concern for pneumonia.  PCCM asked to admit to ICU.  She has been referred to urology as outpt, but not yet evaluated for concerns of bladder cancer.  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  Abdominal pain improved.    VITAL SIGNS: Temp:  [97.5 F (36.4 C)-99.2 F (37.3 C)] 98.2 F (36.8 C) (05/27 0400) Pulse Rate:  [52-134] 104 (05/27 0800) Resp:  [12-31] 15 (05/27 0800) BP: (77-125)/(43-79) 113/64 mmHg (05/27 0800) SpO2:  [97 %-100 %] 99 % (05/27 0800) Weight:  [55.9 kg (123 lb 3.8 oz)] 55.9 kg (123 lb 3.8 oz) (05/27 0400)   3 liters [place]  INTAKE / OUTPUT: Intake/Output     05/26 0701 - 05/27 0700 05/27 0701 - 05/28 0700   I.V. (mL/kg) 2250 (40.3) 200 (3.6)   Other     IV Piggyback 300    Total Intake(mL/kg) 2550 (45.6) 200 (3.6)   Urine (mL/kg/hr) 520 (0.4) 250 (1.9)   Stool 2 (0)    Total Output 522 250   Net +2028 -50        Urine Occurrence 4 x 250 x   Stool Occurrence 1 x      PHYSICAL EXAMINATION: General:  Il appearing Neuro: alert, follows commands, normal strength HEENT:  Pale sclera, dry oral mucosa Cardiovascular: irregular Lungs: scattered rhonchi Abdomen: soft, non tender, decreased bowel sounds Musculoskeletal: decreased muscle mass Skin:  No rash  LABS:  Recent Labs Lab 11/29/12 2330  11/30/12 0341 12/01/12 0345  NA 132* 133* 133*  K 4.5 4.2 4.0  CL 99 102 103  CO2 20 20 17*  BUN 46* 44* 28*  CREATININE 1.53* 1.45* 1.04  GLUCOSE 73 80 89    Recent Labs Lab 11/30/12 0341 11/30/12 1015 12/01/12 0345  HGB 8.4* 8.6* 8.4*  HCT 24.9* 24.3* 25.4*  WBC 8.1 8.7 6.0  PLT 255 240 215     ASSESSMENT / PLAN:  PULMONARY A: Acute hypoxic respiratory failure with pulmonary infiltrates concerning for PNA.  She is on chronic immunosuppression for inclusion body myositis. This may account for minimal immune response in re: to the CT scan P:   -oxygen to keep SpO2 > 92% -see ID section   CARDIOVASCULAR A: A fib with RVR in setting ofhemorrhagic/hypovolemic. Still has frequent PACs.    shock.-->resolved P:  -keep even -hold ASA due to hematuria -hold lopressor while hypotensive -monitor heart rhythm on tele  RENAL A:  Acute kidney injury 2nd to hypovolemia.-->improved  Hematuria with concern for bladder cancer. Having sig bladder spasms associated w/ passing bloody clots P:   -continue IV fluids at 100 ml/hr -keep foley in -monitor renal fx, urine outpt, electrolytes  -need more definitve recs from Urology-->they will see again today  -may consider CBI  GASTROINTESTINAL A:  Nausea >> improved 5/26. Protein  calorie malnutrition. P:   -PRN zofran -Protonix IV for SUP -SLP evaluation  HEMATOLOGIC A: Acute hemorrhagic anemia from hematuria.      P:  -f/u CBC -transfuse for Hb < 7, or hemodynamic instability with active bleeding  INFECTIOUS A: Pulmonary infiltrates with concern for PNA. Possible UTI. P:   -Continue cefepime and azithromycin -hold imuran due to concern for infection  ENDOCRINE A:  Relative adrenal insufficiency with hx of chronic prednisone use. Hx of hypothyroid. P:   -added solucortef 5/26, can wean  -continue synthroid  RHEUMATOLOGY A: Hx of inclusion body myositis. P: -hold imuran in setting of possible infection    NEUROLOGIC A: Hx of anxiety, chronic pain. P:   -PRN ativan -continue lidoderm -hold xanax, remeron for now  Mrs. Klinge remains in ST with PACs this morning. Will continue volume support due to the hematuria and follow urology recommendations.  By Anders Simmonds nurse practitioner   STAFF NOTE: I, Dr Lavinia Sharps have personally reviewed patient's available data, including medical history, events of note, physical examination and test results as part of my evaluation. I have discussed with resident/NP and other care providers such as pharmacist, RN and RRT.  In addition,  I personally evaluated patient and elicited key findings of massive hematuria with hypotension needing  Transfusion. She still continues to pass blood clots were Foley catheter making nursing management difficult. Family is very concerned that only outpatient urology and they are requesting inpatient urology consultation. Nurse practitioner will call urology services began. In addition family is mentioning black stools which no one has reported including nursing. We will also check stool occult blood. Patient can go to the medical floor under triad hospitalist  Rest per NP/medical resident whose note is outlined above and that I agree with  I personally updated the family members  Dr. Kalman Shan, M.D., [facility].C.P Pulmonary and Critical Care Medicine Staff Physician [facility] [place] [facility] Pager: [phone], If no answer or between  15:00h - 7:00h: call [phone]  12/01/2012 12:39 PM

## 2012-12-01 NOTE — Progress Notes (Signed)
I reviewed the CT scan Have pt f/up with local urologist for cystoscopy

## 2012-12-01 NOTE — Progress Notes (Signed)
Bladder irrigation with Normal Saline 250 cc. Bloody pink tinged urine with multi small dark clots returned. Urology RN stated it was difficult to flush with not complete return of NS instilled. CBI connected. NS 3000 cc flushed through cath until return clear.

## 2012-12-01 NOTE — Progress Notes (Signed)
Pt continues to have intermittent crying episodes of pain at foley site.  Foley continues to drain hematuria in bag and bloody clots around the catheter.  MD notified with orders for pain med increased.  CBC to be done this am.

## 2012-12-01 NOTE — Evaluation (Signed)
Clinical/Bedside Swallow Evaluation Patient Details  Name: [name] MRN: [id number] Date of Birth: August 14, 1925  Today's Date: 12/01/2012 Time: 1227-1300 SLP Time Calculation (min): 33 min  Past Medical History:  Past Medical History  Diagnosis Date  . Hypothyroidism   . Atrial fibrillation   . Myositis     BODY  . LVH (left ventricular hypertrophy)     MILD  . Dizziness   . Headache   . Weakness   . Chest pain   . DOE (dyspnea on exertion)   . Poor appetite   . UTI (urinary tract infection)   . Dysphagia     History of dysphagia 2, depressed by esophagus status post dilation  . Arthritis   . Hypokalemia   . History of anxiety   . Orthostatic hypotension   . Inclusion body myositis   . Easy bruising   . Complication of anesthesia     anxiety before anesthesia, wants spinal  . Myalgia and myositis, unspecified 04/30/2011  . Malnutrition/Cachexia 05/05/2012    Pre-morbid body weight 150 lbs, 04/2012 <100 lbs   . Failure to thrive 05/05/2012  . Macrocytosis 05/05/2012  . Diarrhea 05/05/2012  . Anxiety    Past Surgical History:  Past Surgical History  Procedure Laterality Date  . Total hip arthroplasty  2000 or 2001    right  . Transthoracic echocardiogram  07/08/10    EF 55-60%  . Left total hip arthroplasty  1996  . Total hip revision  12/18/2011    Procedure: TOTAL HIP REVISION;  Surgeon: Loanne Drilling, MD;  Location: [facility];  Service: Orthopedics;  Laterality: Right;  Right Hip Revision to Constrained Liner-acetabular   . Flexible sigmoidoscopy  05/07/2012    Procedure: FLEXIBLE SIGMOIDOSCOPY;  Surgeon: Malissa Hippo, MD;  Location: [facility];  Service: Endoscopy;  Laterality: N/A;  . Partial hysterectomy N/A     distant history, type unknown   HPI:  79 yo female presented to ED with progressive hematuria, abdominal pain, and hypotension with A fib and RVR, severe anemia, and concern for pneumonia. 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 Previous MBS complete 05/15/12 indicated a Moderate pharyngeal and cervical esophageal phase dysphagia characterized by delay in swallow initiation with pooling in pyriform sinuses with thin liquids, decreased tongue base retraction and hyolaryngeal excursion, little to no epiglottic deflection, appearance of cervical osteophytes which did not seem to impede flow, overall decreased pharyngeal pressure, and poor clearance at CP with suspected Zenker's diverticulum. Recommendations for a mechanical soft diet, thin liquids with strict use of aspiration precautions were provided.    Assessment / Plan / Recommendation Clinical Impression  Bedside swallow evaluation complete. Overall function appears consistent with most recent MBS report in which patient presents with both an oropharyngeal and esophageal dysphagia as evidenced today at bedside by delayed oral transit, suspected delay in swallow initiation as well as multiple swallows per bite/sip and intermittent throat clearing suggestive of pharyngeal residuals and possible penetration/aspiration. Vocal quality remained clear. SLP provided max cueing for alternation of bite and sip to aid in both pharyngeal and esophageal clearance. Discussed findings with patient and family members who do not report any worsening in symptoms over the past two years. Although cannot r/o episodic aspiration, particularly given acute PNA this admission, do not suspect that repeat objective study would show any significant changes.  Additonally, patient strongly against thickened liquids at this time, is likely not to  drink, therefore contributing to an increased risk of dehydration.  Patient and family in agreement to initiate a dysphagia 1 (puree) diet given increased s/s of difficulty with soft solids, thin liquids, with strict aspiration precautions, verbalizing understanding that h/o Inclusion body myositis likely contributing  to difficulty which may progress. Noted possible cervical esophageal deficits as well as diverticulum on previous MBS. May benefit from esophageal w/u vs ENT consult if has not been complete in past although again, unclear if intervention would be appropriate, or even beneficial at this time given advanced age and additional medical issues. SLP will f/u at bedside with focus on use of compensatory strategies to mitigate, but not eliminate, risk of aspiration.     Aspiration Risk  Moderate    Diet Recommendation Dysphagia 1 (Puree);Thin liquid   Liquid Administration via: Cup;Straw Medication Administration: Crushed with puree Supervision: Patient able to self feed;Full supervision/cueing for compensatory strategies (staff to assist with feeding as needed) Compensations: Slow rate;Small sips/bites;Multiple dry swallows after each bite/sip;Follow solids with liquid;Clear throat intermittently Postural Changes and/or Swallow Maneuvers: Seated upright 90 degrees;Upright 30-60 min after meal    Other  Recommendations Oral Care Recommendations: Oral care BID   Follow Up Recommendations  Skilled Nursing facility    Frequency and Duration min 2x/week  2 weeks   Pertinent Vitals/Pain None reported    SLP Swallow Goals Patient will utilize recommended strategies during swallow to increase swallowing safety with: Minimal assistance Swallow Study Goal #2 - Progress:  (new goal)   Swallow Study    General HPI: 79 yo female presented to ED with progressive hematuria, abdominal pain, and hypotension with A fib and RVR, severe anemia, and concern for pneumonia. 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 Previous MBS complete 05/15/12 indicated a Moderate pharyngeal and cervical esophageal phase dysphagia characterized by delay in swallow initiation with pooling in pyriform sinuses with thin liquids, decreased tongue base retraction and  hyolaryngeal excursion, little to no epiglottic deflection, appearance of cervical osteophytes which did not seem to impede flow, overall decreased pharyngeal pressure, and poor clearance at CP with suspected Zenker's diverticulum. Recommendations for a mechanical soft diet, thin liquids with strict use of aspiration precautions were provided.  Type of Study: Bedside swallow evaluation Previous Swallow Assessment: see HPI Diet Prior to this Study: Thin liquids (clear liquids) Temperature Spikes Noted: No Respiratory Status: Room air History of Recent Intubation: No Behavior/Cognition: Alert;Cooperative;Pleasant mood Oral Cavity - Dentition: Poor condition;Missing dentition Self-Feeding Abilities: Able to feed self;Needs assist Patient Positioning: Upright in bed Baseline Vocal Quality: Clear Volitional Cough: Strong Volitional Swallow: Able to elicit    Oral/Motor/Sensory Function Overall Oral Motor/Sensory Function:  (generalized weakness, otherwise WFL)   Ice Chips Ice chips: Not tested   Thin Liquid Thin Liquid: Impaired Presentation: Cup;Straw Pharyngeal  Phase Impairments: Suspected delayed Swallow;Decreased hyoid-laryngeal movement;Multiple swallows;Throat Clearing - Delayed    Nectar Thick Nectar Thick Liquid: Not tested   Honey Thick Honey Thick Liquid: Not tested   Puree Puree: Impaired Presentation: Spoon Pharyngeal Phase Impairments: Decreased hyoid-laryngeal movement;Multiple swallows;Throat Clearing - Delayed   Solid   GO    Solid: Impaired Presentation: Spoon Oral Phase Impairments: Impaired anterior to posterior transit Oral Phase Functional Implications: Oral residue (delayed oral transit) Pharyngeal Phase Impairments: Decreased hyoid-laryngeal movement;Multiple swallows;Throat Clearing - Immediate;Throat Clearing - Delayed      Ferdinand Lango MA, CCC-SLP [phone]  [place] Marina Meryl 12/01/2012,2:36 PM

## 2012-12-01 NOTE — Progress Notes (Signed)
eLink Physician-Brief Progress Note Patient Name: [name] DOB: Jun 16, 1926 MRN: [id number]  Date of Service  12/01/2012   HPI/Events of Note  Anxiety - not relieved with IV fentanyl   eICU Interventions  One time order for ativan 0.5 mg IV   Intervention Category Minor Interventions: Agitation / anxiety - evaluation and management  Yusuf Yu 12/01/2012, 12:54 AM

## 2012-12-01 NOTE — Progress Notes (Signed)
Spoke with medical/nursing team Surprised to hear that patient has passed some large clots around catheter since yesterday only dark blood with no clots/spams The bladder phase of the CT difficult to see but did review with report and agree Hb stable and it was 10.3 in January 2014; was 9.13 May 2012 but has been transfused recently/and in the past Describes bladder spasm last night  The patient says she get some s/p spasms but not that severe-come and go Catheter again dark urine only Belly is soft Vitals noted Hb remains re 8.4 over 48 hours Assessment: likely is bleeding minimally now but having spasms secondary to foley and clots in bladder Plan: 24 Fr foley/irrigate/CBI/follow labs/outpt cystoscopy/spoke with family and answered questions

## 2012-12-02 LAB — BASIC METABOLIC PANEL
CO2: 16 mEq/L — ABNORMAL LOW (ref 19–32)
Calcium: 7.9 mg/dL — ABNORMAL LOW (ref 8.4–10.5)
Creatinine, Ser: 0.97 mg/dL (ref 0.50–1.10)

## 2012-12-02 LAB — CBC
MCH: 31.9 pg (ref 26.0–34.0)
MCHC: 33.8 g/dL (ref 30.0–36.0)
MCV: 94.4 fL (ref 78.0–100.0)
Platelets: 226 10*3/uL (ref 150–400)
RDW: 17.4 % — ABNORMAL HIGH (ref 11.5–15.5)

## 2012-12-02 MED ORDER — HYDROCORTISONE SOD SUCCINATE 100 MG IJ SOLR
50.0000 mg | Freq: Two times a day (BID) | INTRAMUSCULAR | Status: DC
  Administered 2012-12-02 – 2012-12-03 (×2): 50 mg via INTRAVENOUS
  Filled 2012-12-02 (×3): qty 1

## 2012-12-02 MED ORDER — METOPROLOL TARTRATE 12.5 MG HALF TABLET: 12.5000 mg | ORAL_TABLET | Freq: Every morning | ORAL | Status: DC

## 2012-12-02 MED ORDER — MUSCLE RUB 10-15 % EX CREA
TOPICAL_CREAM | CUTANEOUS | Status: DC | PRN
  Administered 2012-12-02: 23:00:00 via TOPICAL
  Filled 2012-12-02 (×2): qty 85

## 2012-12-02 MED ORDER — METOPROLOL TARTRATE 12.5 MG HALF TABLET
12.5000 mg | ORAL_TABLET | Freq: Every morning | ORAL | Status: DC
  Administered 2012-12-02 – 2012-12-04 (×3): 12.5 mg via ORAL
  Filled 2012-12-02 (×3): qty 1

## 2012-12-02 MED ORDER — ALPRAZOLAM 0.5 MG PO TABS
0.5000 mg | ORAL_TABLET | Freq: Two times a day (BID) | ORAL | Status: DC | PRN
  Administered 2012-12-02 – 2012-12-03 (×3): 0.5 mg via ORAL
  Filled 2012-12-02 (×3): qty 1

## 2012-12-02 MED ORDER — HYDROMORPHONE HCL PF 1 MG/ML IJ SOLN
0.5000 mg | INTRAMUSCULAR | Status: DC | PRN
  Administered 2012-12-02 – 2012-12-04 (×7): 0.5 mg via INTRAVENOUS
  Filled 2012-12-02 (×9): qty 1

## 2012-12-02 NOTE — Research (Signed)
Pt c/o being very nervous because her bed "moves". Pt informed that all beds move to help with circulation. Her heart rate now over 140, Lopressor 2.5mg  given per IVP. Verner Mould, DO notified for med for anxiety, awaiting response.

## 2012-12-02 NOTE — Progress Notes (Signed)
ANTIBIOTIC CONSULT NOTE - FOLLOW UP  Pharmacy Consult for Cefepime Indication: UTI,  PNA  Allergies  Allergen Reactions  . Codeine Other (See Comments)    unknown  . Morphine And Related Other (See Comments)    unknown  . Piroxicam Other (See Comments)    unknown   Patient Measurements: Height: 5\' 4"  (162.6 cm) Weight: 123 lb 3.8 oz (55.9 kg) IBW/kg (Calculated) : 54.7  Vital Signs: Temp: 97.8 F (36.6 C) (05/28 0800) Temp src: Oral (05/28 0800) BP: 113/59 mmHg (05/28 1000) Pulse Rate: 84 (05/28 1000) Intake/Output from previous day: 05/27 0701 - 05/28 0700 In: 8100 [P.O.:300; I.V.:2500; IV Piggyback:300] Out: 9300 [Urine:9300] Intake/Output from this shift: Total I/O In: 2300 [I.V.:300; Other:2000] Out: 2950 [Urine:2950]  Labs:  Recent Labs  11/30/12 0341 11/30/12 1015 12/01/12 0345 12/02/12 0855  WBC 8.1 8.7 6.0 7.1  HGB 8.4* 8.6* 8.4* 9.2*  PLT 255 240 215 226  CREATININE 1.45*  --  1.04 0.97   Estimated Creatinine Clearance: 36.6 ml/min (by C-G formula based on Cr of 0.97). No results found for this basename: VANCOTROUGH, Leodis Binet, VANCORANDOM, GENTTROUGH, GENTPEAK, GENTRANDOM, TOBRATROUGH, TOBRAPEAK, TOBRARND, AMIKACINPEAK, AMIKACINTROU, AMIKACIN,  in the last 72 hours   Microbiology: Recent Results (from the past 720 hour(s))  CULTURE, BLOOD (ROUTINE X 2)     Status: None   Collection Time    11/29/12  4:45 PM      Result Value Range Status   Specimen Description BLOOD LEFT ARM   Final   Special Requests     Final   Value: BOTTLES DRAWN AEROBIC AND ANAEROBIC 6CC EACH BOTTLE   Culture NO GROWTH 3 DAYS   Final   Report Status PENDING   Incomplete  URINE CULTURE     Status: None   Collection Time    11/29/12  4:49 PM      Result Value Range Status   Specimen Description URINE, CATHETERIZED   Final   Special Requests NONE   Final   Culture  Setup Time 11/29/2012 21:30   Final   Colony Count >=100,000 COLONIES/ML   Final   Culture GRAM NEGATIVE  RODS   Final   Report Status PENDING   Incomplete  MRSA PCR SCREENING     Status: None   Collection Time    11/29/12  8:28 PM      Result Value Range Status   MRSA by PCR NEGATIVE  NEGATIVE Final   Comment:            The GeneXpert MRSA Assay (FDA     approved for NASAL specimens     only), is one component of a     comprehensive MRSA colonization     surveillance program. It is not     intended to diagnose MRSA     infection nor to guide or     monitor treatment for     MRSA infections.    Anti-infectives   Start     Dose/Rate Route Frequency Ordered Stop   11/29/12 2300  ceFEPIme (MAXIPIME) 1 g in dextrose 5 % 50 mL IVPB     1 g 100 mL/hr over 30 Minutes Intravenous Every 24 hours 11/29/12 2233     11/29/12 1830  cefTRIAXone (ROCEPHIN) 1 g in dextrose 5 % 50 mL IVPB     1 g 100 mL/hr over 30 Minutes Intravenous  Once 11/29/12 1822 11/29/12 1906   11/29/12 1830  azithromycin (ZITHROMAX) 500 mg in dextrose 5 %  250 mL IVPB     500 mg 250 mL/hr over 60 Minutes Intravenous Every 24 hours 11/29/12 1822       Assessment: 47 yoF with hematuria, hx of bladder Ca, urine cx growing GNR. CXray on admit suggestive of PNA  Day 4 Cefepime/ Azithromycin. Await urine cx result  AKI resolving, Cl > 30 ml/min  Goal of Therapy:  Abx appropriate for disease state and renal function  Plan:  Continue Cefepime 1gm q24 hr Azithromycin is not renally excreted, no adjustment necessary. Await urine cx.  Otho Bellows PharmD Pager [phone] 12/02/2012, 12:56 PM.

## 2012-12-02 NOTE — Progress Notes (Signed)
Pt's pain improved during the night and she was able to rest, there was no moaning or crying out. Pain meds were changed from Fentanyl to Dilaudid and Ditropan was changed from q8 prn to q6 scheduled.   CBI fluid continued with urinary output with a very pale yellow with no clots.

## 2012-12-02 NOTE — Progress Notes (Signed)
Heart rate down to the 80's now. Will continue to monitor.

## 2012-12-02 NOTE — Progress Notes (Signed)
eLink Physician-Brief Progress Note Patient Name: HAILEY STORMER DOB: 06-22-1926 MRN: [id number]  Date of Service  12/02/2012   HPI/Events of Note  Continued issues with pain control with patient continuing to call out in pain.     eICU Interventions  Change freq of dilaudid and reduce dose.   Intervention Category Intermediate Interventions: Pain - evaluation and management  DETERDING,ELIZABETH 12/02/2012, 12:31 AM

## 2012-12-02 NOTE — Progress Notes (Signed)
[facility] PROGRESS NOTE  KARINNA BEADLES ZOX:[id number] DOB: [birth year] DOA: 11/29/2012 PCP: Herb Grays, MD  Assessment/Plan: Acute hypoxic respiratory failure with pulmonary infiltrates concerning for PNA. She is on chronic immunosuppression for inclusion body myositis.  -oxygen to keep SpO2 > 92%    A fib with RVR in setting ofhemorrhagic/hypovolemic. Still has frequent PACs.  shock.-->resolved  -keep even  -hold ASA due to hematuria  -hold lopressor while hypotensive  -monitor heart rhythm on tele    Acute kidney injury 2nd to hypovolemia.-->resolved   Hematuria with concern for bladder cancer. Having sig bladder spasms associated w/ passing bloody clots  -continue IV fluids at 100 ml/hr  -keep foley in until AM -monitor renal fx, urine outpt, electrolytes  -urology following  S/p CBI   Nausea >> improved 5/26.  -PRN zofran  -Protonix IV for SUP  -SLP: DYS 1 diet   Acute hemorrhagic anemia from hematuria.   -f/u CBC  -transfuse for Hb < 7, or hemodynamic instability with active bleeding    Pulmonary infiltrates with concern for PNA.  Possible UTI- gram neg rods on culture -Continue cefepime and azithromycin  -hold imuran due to concern for infection   Relative adrenal insufficiency with hx of chronic prednisone use.  Hx of hypothyroid.  P:  -added solucortef 5/26, can wean  -continue synthroid    Hx of inclusion body myositis.  -hold imuran in setting of possible infection   Hx of anxiety, chronic pain.   -PRN ativan  -continue lidoderm  -hold xanax, remeron for now      Code Status: full Family Communication:patient at bedside Disposition Plan: tx to floor today (tele)   Consultants:  urology  Procedures:    Antibiotics:    HPI/Subjective: Some nausea this AM Feeling better -in wheelchair at home  Objective: Filed Vitals:   12/02/12 0400 12/02/12 0500 12/02/12 0600 12/02/12 0700  BP: 104/81 121/59 109/73 112/69  Pulse:  83 84 82 59  Temp:      TempSrc:      Resp: 13 16 10 13   Height:      Weight:      SpO2: 98% 98% 98% 99%    Intake/Output Summary (Last 24 hours) at 12/02/12 0824 Last data filed at 12/02/12 0700  Gross per 24 hour  Intake   7840 ml  Output   9050 ml  Net  -1210 ml   Filed Weights   11/29/12 1930 11/30/12 0000 12/01/12 0400  Weight: 57.3 kg (126 lb 5.2 oz) 57.3 kg (126 lb 5.2 oz) 55.9 kg (123 lb 3.8 oz)    Exam:   General:  A+Ox3, NAD  Cardiovascular: rrr  Respiratory: clear anterior  Abdomen: +BS, soft, NT  Musculoskeletal: moves all 4 ext-weak   Data Reviewed: Basic Metabolic Panel:  Recent Labs Lab 11/29/12 1650 11/29/12 2330 11/30/12 0341 12/01/12 0345  NA 126* 132* 133* 133*  K 5.1 4.5 4.2 4.0  CL 91* 99 102 103  CO2 21 20 20  17*  GLUCOSE 90 73 80 89  BUN 58* 46* 44* 28*  CREATININE 1.90* 1.53* 1.45* 1.04  CALCIUM 8.9 7.9* 7.7* 8.0*  MG  --   --   --  2.3  PHOS  --   --   --  3.6   Liver Function Tests:  Recent Labs Lab 11/29/12 1650 11/29/12 2330 12/01/12 0345  AST 31 22 25   ALT 13 11 12   ALKPHOS 84 73 71  BILITOT 0.3 0.5 0.3  PROT 6.4  5.8* 5.4*  ALBUMIN 2.6* 2.3* 2.1*   No results found for this basename: LIPASE, AMYLASE,  in the last 168 hours No results found for this basename: AMMONIA,  in the last 168 hours CBC:  Recent Labs Lab 11/29/12 1650 11/29/12 2330 11/30/12 0341 11/30/12 1015 12/01/12 0345  WBC 8.5 8.7 8.1 8.7 6.0  NEUTROABS 7.6 7.1  --   --   --   HGB 6.0* 8.6* 8.4* 8.6* 8.4*  HCT 18.2* 25.1* 24.9* 24.3* 25.4*  MCV 98.9 90.9 91.2 91.4 92.4  PLT 340 255 255 240 215   Cardiac Enzymes:  Recent Labs Lab 11/29/12 1650 11/29/12 2330 11/30/12 0341  CKTOTAL  --  134  --   TROPONINI <0.30 <0.30 <0.30   BNP (last 3 results) No results found for this basename: PROBNP,  in the last 8760 hours CBG:  Recent Labs Lab 11/30/12 2023  GLUCAP 83    Recent Results (from the past 240 hour(s))  CULTURE, BLOOD  (ROUTINE X 2)     Status: None   Collection Time    11/29/12  4:45 PM      Result Value Range Status   Specimen Description BLOOD LEFT ARM   Final   Special Requests     Final   Value: BOTTLES DRAWN AEROBIC AND ANAEROBIC 6CC EACH BOTTLE   Culture NO GROWTH 2 DAYS   Final   Report Status PENDING   Incomplete  URINE CULTURE     Status: None   Collection Time    11/29/12  4:49 PM      Result Value Range Status   Specimen Description URINE, CATHETERIZED   Final   Special Requests NONE   Final   Culture  Setup Time 11/29/2012 21:30   Final   Colony Count >=100,000 COLONIES/ML   Final   Culture GRAM NEGATIVE RODS   Final   Report Status PENDING   Incomplete  MRSA PCR SCREENING     Status: None   Collection Time    11/29/12  8:28 PM      Result Value Range Status   MRSA by PCR NEGATIVE  NEGATIVE Final   Comment:            The GeneXpert MRSA Assay (FDA     approved for NASAL specimens     only), is one component of a     comprehensive MRSA colonization     surveillance program. It is not     intended to diagnose MRSA     infection nor to guide or     monitor treatment for     MRSA infections.     Studies: Ct Abdomen Pelvis Wo Contrast  11/30/2012   *RADIOLOGY REPORT*  Clinical Data:  Pulmonary infiltrates, abdominal pain, hematuria with concern for bladder cancer, acute renal failure  CT CHEST, ABDOMEN AND PELVIS WITHOUT CONTRAST  Technique:  Multidetector CT imaging of the chest, abdomen and pelvis was performed following the standard protocol without IV contrast.  Comparison:  Abdominal radiographs dated 11/30/2012.  Chest radiograph dated 11/29/2012.  CT CHEST  Findings:  Wedge-shaped patchy/ground-glass opacities in the superior segment right lower lobe (series 4/image 27) and lingula (series 4/image 35).  Additional patchy opacities in the lateral right middle lobe (series 4/image 30) and lung bases.  Associated small bilateral pleural effusions.  No pneumothorax.  Cardiomegaly.   No pericardial effusion.  Coronary atherosclerosis. Prosthetic mitral valve.  Atherosclerotic calcifications of the aortic arch.  No suspicious mediastinal or  axillary lymphadenopathy.  IMPRESSION: Multifocal patchy opacities, as described above, suspicious for pneumonia, possibly on the basis of aspiration.  In the absence of infectious symptoms, early pulmonary infarcts related to pulmonary emboli would be an additional consideration, although this is considered less likely.  Small bilateral pleural effusions with associated bibasilar atelectasis.  CT ABDOMEN AND PELVIS  Findings:  Unenhanced liver, pancreas, and adrenal glands are within normal limits.  Scattered splenic granulomata.  Gallbladder is unremarkable.  No intrahepatic or extrahepatic ductal dilatation.  2 mm nonobstructing right upper pole renal calculus (series 2/image 61).  No hydronephrosis.  No evidence of bowel obstruction.  Normal appendix.  A long segment of transverse and proximal descending colon is mildly thick-walled but underdistended.  Mild rectal wall thickening (series 2/image 118).  Atherosclerotic calcifications of the abdominal aorta and branch vessels.  No suspicious abdominopelvic lymphadenopathy.  Bladder is poorly evaluated due to streak artifact from the patient's bilateral hip prostheses.  Nondependent gas and indwelling Foley catheter.  Suspected layering gross hemorrhage (series 2/image 98).  Degenerative changes of the visualized thoracolumbar spine. Bilateral total hip arthroplasties.  IMPRESSION: Suspected layering gross hemorrhage in the bladder.  Bladder is otherwise poorly evaluated due to streak artifact.  2 mm nonobstructing right upper pole renal calculus.  No hydronephrosis.  Rectal wall thickening, nonspecific.  Correlate with digital rectal exam or proctoscopy.  Additional ancillary findings as above.   Original Report Authenticated By: Charline Bills, M.D.   Ct Chest Wo Contrast  11/30/2012   *RADIOLOGY  REPORT*  Clinical Data:  Pulmonary infiltrates, abdominal pain, hematuria with concern for bladder cancer, acute renal failure  CT CHEST, ABDOMEN AND PELVIS WITHOUT CONTRAST  Technique:  Multidetector CT imaging of the chest, abdomen and pelvis was performed following the standard protocol without IV contrast.  Comparison:  Abdominal radiographs dated 11/30/2012.  Chest radiograph dated 11/29/2012.  CT CHEST  Findings:  Wedge-shaped patchy/ground-glass opacities in the superior segment right lower lobe (series 4/image 27) and lingula (series 4/image 35).  Additional patchy opacities in the lateral right middle lobe (series 4/image 30) and lung bases.  Associated small bilateral pleural effusions.  No pneumothorax.  Cardiomegaly.  No pericardial effusion.  Coronary atherosclerosis. Prosthetic mitral valve.  Atherosclerotic calcifications of the aortic arch.  No suspicious mediastinal or axillary lymphadenopathy.  IMPRESSION: Multifocal patchy opacities, as described above, suspicious for pneumonia, possibly on the basis of aspiration.  In the absence of infectious symptoms, early pulmonary infarcts related to pulmonary emboli would be an additional consideration, although this is considered less likely.  Small bilateral pleural effusions with associated bibasilar atelectasis.  CT ABDOMEN AND PELVIS  Findings:  Unenhanced liver, pancreas, and adrenal glands are within normal limits.  Scattered splenic granulomata.  Gallbladder is unremarkable.  No intrahepatic or extrahepatic ductal dilatation.  2 mm nonobstructing right upper pole renal calculus (series 2/image 61).  No hydronephrosis.  No evidence of bowel obstruction.  Normal appendix.  A long segment of transverse and proximal descending colon is mildly thick-walled but underdistended.  Mild rectal wall thickening (series 2/image 118).  Atherosclerotic calcifications of the abdominal aorta and branch vessels.  No suspicious abdominopelvic lymphadenopathy.  Bladder  is poorly evaluated due to streak artifact from the patient's bilateral hip prostheses.  Nondependent gas and indwelling Foley catheter.  Suspected layering gross hemorrhage (series 2/image 98).  Degenerative changes of the visualized thoracolumbar spine. Bilateral total hip arthroplasties.  IMPRESSION: Suspected layering gross hemorrhage in the bladder.  Bladder is otherwise poorly evaluated due  to streak artifact.  2 mm nonobstructing right upper pole renal calculus.  No hydronephrosis.  Rectal wall thickening, nonspecific.  Correlate with digital rectal exam or proctoscopy.  Additional ancillary findings as above.   Original Report Authenticated By: Charline Bills, M.D.    Scheduled Meds: . azithromycin  500 mg Intravenous Q24H  . ceFEPime (MAXIPIME) IV  1 g Intravenous Q24H  . hydrocortisone sod succinate (SOLU-CORTEF) inj  100 mg Intravenous Q8H  . levothyroxine  56 mcg Intravenous QAC breakfast  . lidocaine  1 patch Transdermal Q24H  . oxybutynin  5 mg Oral QID  . pantoprazole (PROTONIX) IV  40 mg Intravenous Q24H   Continuous Infusions: . sodium chloride 1,000 mL (12/01/12 2054)    Active Problems:   Bladder tumor, right dome   Hematuria   Hemorrhagic shock   Atrial fibrillation with RVR   HCAP (healthcare-associated pneumonia)    Time spent:35    Marlin Canary  [facility] Pager [phone]. If 7PM-7AM, please contact night-coverage at [email or web address], password [facility] 12/02/2012, 8:24 AM  LOS: 3 days

## 2012-12-02 NOTE — Evaluation (Signed)
Physical Therapy Evaluation Patient Details Name: [name] MRN: [id number] DOB: Jun 04, 1926 Today's Date: 12/02/2012 Time: 1478-2956 PT Time Calculation (min): 22 min  PT Assessment / Plan / Recommendation Clinical Impression  Pt presents with bladder tumor with history of afib and myositis.  Tolerated OOB to chair via squat pivot transfer at max assist level.  Pt states she is from rest home in [place] and was getting help to get into w/c there.  Pt will benefit from skilled PT in acute venue to address deficits.  PT recommends pt return to SNF at D/C to maximize pts safety and function.     PT Assessment  Patient needs continued PT services    Follow Up Recommendations  SNF;Supervision/Assistance - 24 hour    Does the patient have the potential to tolerate intense rehabilitation      Barriers to Discharge None      Equipment Recommendations       Recommendations for Other Services     Frequency Min 3X/week    Precautions / Restrictions Precautions Precautions: Fall Restrictions Weight Bearing Restrictions: No   Pertinent Vitals/Pain Pt states some pain at area of catheter, no number stated.       Mobility  Bed Mobility Bed Mobility: Supine to Sit Supine to Sit: 4: Min assist;HOB elevated;With rails Details for Bed Mobility Assistance: Some assist for trunk to attain sitting position, however pt able to assist herself using handrail to EOB.  Transfers Transfers: Sit to Stand;Stand to Sit;Squat Pivot Transfers Sit to Stand: 2: Max assist;With upper extremity assist;From bed Stand to Sit: 2: Max assist;To chair/3-in-1 Squat Pivot Transfers: 2: Max assist Details for Transfer Assistance: Attempted to stand, however pt unable/refused to push up from bed and insisted on reaching around therapist to get to chair.  Able to tolerate squat pivot from bed to chair, however feel that she could have particiapted more based on bed mobiltiy abilities.   Ambulation/Gait Ambulation/Gait Assistance: Not tested (comment)    Exercises     PT Diagnosis: Difficulty walking;Generalized weakness;Acute pain  PT Problem List: Decreased strength;Decreased activity tolerance;Decreased balance;Decreased mobility;Pain;Decreased safety awareness PT Treatment Interventions: DME instruction;Gait training;Functional mobility training;Therapeutic activities;Therapeutic exercise;Balance training;Patient/family education   PT Goals Acute Rehab PT Goals PT Goal Formulation: With patient Time For Goal Achievement: 12/09/12 Potential to Achieve Goals: Good Pt will go Supine/Side to Sit: with supervision PT Goal: Supine/Side to Sit - Progress: Goal set today Pt will go Sit to Supine/Side: with supervision PT Goal: Sit to Supine/Side - Progress: Goal set today Pt will go Sit to Stand: with mod assist PT Goal: Sit to Stand - Progress: Goal set today Pt will go Stand to Sit: with mod assist PT Goal: Stand to Sit - Progress: Goal set today Pt will Transfer Bed to Chair/Chair to Bed: with mod assist PT Transfer Goal: Bed to Chair/Chair to Bed - Progress: Goal set today Pt will Ambulate: 1 - 15 feet;with mod assist;with least restrictive assistive device PT Goal: Ambulate - Progress: Goal set today  Visit Information  Last PT Received On: 12/02/12 Assistance Needed: +2 (if trying to amb)    Subjective Data  Subjective: Don't make me do too much.  I didn't sleep last night Patient Stated Goal: to return to rest home   Prior Functioning  Home Living Lives With: Other (Comment) Available Help at Discharge:  (Pt from "rest home" in [place]) Home Layout: One level Bathroom Shower/Tub:  (Pt sponge bathes) Home Adaptive Equipment: Wheelchair - manual;Hospital bed  Prior Function Comments: Pt states that she was able to bath and dress herself, however needed help getting OOB and into a w/c so that she could push herself around rest home.   Communication Communication: No difficulties    Cognition  Cognition Arousal/Alertness: Awake/alert Behavior During Therapy: WFL for tasks assessed/performed Overall Cognitive Status: Within Functional Limits for tasks assessed    Extremity/Trunk Assessment Right Lower Extremity Assessment RLE ROM/Strength/Tone: Deficits RLE ROM/Strength/Tone Deficits: pt with generalized weakness, however grossly 3/5 per functional observation RLE Sensation: WFL - Light Touch Left Lower Extremity Assessment LLE ROM/Strength/Tone: Deficits LLE ROM/Strength/Tone Deficits: pt with generalized weakness, however grossly 3/5 per functional observation.  LLE Sensation: WFL - Light Touch Trunk Assessment Trunk Assessment: Kyphotic   Balance Balance Balance Assessed: Yes Static Sitting Balance Static Sitting - Balance Support: Bilateral upper extremity supported;Feet supported Static Sitting - Level of Assistance: 5: Stand by assistance  End of Session PT - End of Session Equipment Utilized During Treatment: Gait belt Activity Tolerance: Patient limited by fatigue Patient left: in chair;with call bell/phone within reach Nurse Communication: Mobility status  GP     Vista Deck 12/02/2012, 5:42 PM

## 2012-12-02 NOTE — Progress Notes (Signed)
Pt received from ICU via chair. Pt oriented to room and call light. Bed alarm on.

## 2012-12-02 NOTE — Progress Notes (Signed)
No spasma Hb pending  VERY clear ? D/C cath inam

## 2012-12-02 NOTE — Progress Notes (Signed)
Speech Language Pathology Dysphagia Treatment Patient Details Name: [name] MRN: [id number] DOB: Jul 05, 1926 Today's Date: 12/02/2012 Time: 1150-1205 SLP Time Calculation (min): 15 min  Assessment / Plan / Recommendation Clinical Impression  F/u after yesterday's swallow assessment.  Pt has been started on a dysphagia 1 diet, thin liquids.  Awaiting lunch - consumed thin liquids and applesauce with mod assist to follow precautions.  Pleasant, cooperative, with some difficulty retaining verbal instructions and their rationale.  Overall with adequate toleration when cues provided.  Recommend continuing with current diet s/p discussion with pt/family yesterday.     Diet Recommendation    dysphagia 1, thin liquids   SLP Plan Continue with current plan of care      Swallowing Goals  SLP Swallowing Goals Patient will utilize recommended strategies during swallow to increase swallowing safety with: Minimal assistance Swallow Study Goal #2 - Progress: Progressing toward goal  General Temperature Spikes Noted: No Respiratory Status: Room air Behavior/Cognition: Alert;Cooperative;Pleasant mood Oral Cavity - Dentition: Poor condition;Missing dentition Patient Positioning: Upright in bed  Oral Cavity - Oral Hygiene     Dysphagia Treatment Treatment focused on: Patient/family/caregiver education;Skilled observation of diet tolerance Treatment Methods/Modalities: Skilled observation Patient observed directly with PO's: Yes Type of PO's observed: Dysphagia 1 (puree);Thin liquids Feeding: Able to feed self Liquids provided via: Straw Pharyngeal Phase Signs & Symptoms: Multiple swallows;Complaints of residue Type of cueing: Verbal Amount of cueing: Moderate        Blenda Mounts Laurice 12/02/2012, 12:10 PM

## 2012-12-03 DIAGNOSIS — E860 Dehydration: Secondary | ICD-10-CM

## 2012-12-03 LAB — URINE CULTURE

## 2012-12-03 MED ORDER — HYDROCORTISONE SOD SUCCINATE 100 MG IJ SOLR
50.0000 mg | Freq: Every day | INTRAMUSCULAR | Status: DC
  Administered 2012-12-04: 50 mg via INTRAVENOUS
  Filled 2012-12-03: qty 1

## 2012-12-03 MED ORDER — LEVOTHYROXINE SODIUM 112 MCG PO TABS
112.0000 ug | ORAL_TABLET | ORAL | Status: AC
  Administered 2012-12-03: 112 ug via ORAL
  Filled 2012-12-03: qty 1

## 2012-12-03 MED ORDER — LEVOTHYROXINE SODIUM 112 MCG PO TABS
112.0000 ug | ORAL_TABLET | Freq: Every day | ORAL | Status: DC
  Administered 2012-12-04: 112 ug via ORAL
  Filled 2012-12-03 (×2): qty 1

## 2012-12-03 NOTE — Evaluation (Signed)
Occupational Therapy Evaluation and Discharge Summary Patient Details Name: [name] MRN: [id number] DOB: 01-Nov-1925 Today's Date: 12/03/2012 Time: 1010-1037 OT Time Calculation (min): 27 min  OT Assessment / Plan / Recommendation Clinical Impression  Pt is an 79 yo female admitted for complications from bladder CA who appears to be nearing her baseline with adls.  Pt had a lot of assist wit adls prior to admit per her report and only mobilized in a w/c.  Feel pt is close to this level and, if needed, can continue her OT back at [name].    OT Assessment  All further OT needs can be met in the next venue of care    Follow Up Recommendations  SNF;Supervision/Assistance - 24 hour    Barriers to Discharge      Equipment Recommendations  None recommended by OT    Recommendations for Other Services    Frequency       Precautions / Restrictions Precautions Precautions: Fall Precaution Comments: pt moves very well in bed but very anxious when getting onto feet and does not assist at all. Restrictions Weight Bearing Restrictions: No   Pertinent Vitals/Pain Pt with no c/o pain    ADL  Eating/Feeding: Performed;Set up Where Assessed - Eating/Feeding: Chair Grooming: Performed;Set up Where Assessed - Grooming: Supported sitting Upper Body Bathing: Simulated;Minimal assistance Where Assessed - Upper Body Bathing: Unsupported sitting Lower Body Bathing: Simulated;Maximal assistance Where Assessed - Lower Body Bathing: Supported sit to stand Upper Body Dressing: Simulated;Minimal assistance Where Assessed - Upper Body Dressing: Supported sitting Lower Body Dressing: Performed;Maximal assistance Where Assessed - Lower Body Dressing: Supported sit to Pharmacist, hospital: Performed;Maximal Dentist Method: Ambulance person: Materials engineer and Hygiene: Performed;+2 Total assistance Toileting - Designer, fashion/clothing and Hygiene: Patient Percentage: 0% Where Assessed - Glass blower/designer Manipulation and Hygiene: Standing Transfers/Ambulation Related to ADLs: Pt was not able to take steps.  Primarily uses W/C at [facility] in [place].  Pt moves very well in the bed but when asked to stand has a hard time using that strength to stand.  I feel pt gets very anxious with standing since she has fallen before and therefore just wants to hang onto someone to transfer and does not assist with getting upn onto her feet. ADL Comments: Pt states she gets help with most adls from staff.  Pt may be close to her baseline with adls.    OT Diagnosis: Cognitive deficits;Generalized weakness  OT Problem List: Impaired balance (sitting and/or standing);Decreased safety awareness OT Treatment Interventions:     OT Goals    Visit Information  Last OT Received On: 12/03/12 Assistance Needed: +2    Subjective Data  Subjective: "I cannot believe I may be leaving tomorrow." Patient Stated Goal: to get well   Prior Functioning     Home Living Lives With: Other (Comment) ([name] in [place]) Available Help at Discharge: Skilled Nursing Facility Type of Home: Skilled Nursing Facility Home Access: Level entry Home Layout: One level Bathroom Shower/Tub: Other (comment) (sponge bathes) Bathroom Toilet: Handicapped height Bathroom Accessibility: Yes How Accessible: Accessible via wheelchair Home Adaptive Equipment: Wheelchair - manual;Hospital bed Prior Function Level of Independence: Needs assistance Needs Assistance: Bathing;Dressing;Toileting;Meal Prep;Light Housekeeping;Transfers;Grooming Bath: Moderate Dressing: Moderate Grooming: Minimal Toileting: Moderate Meal Prep: Total Light Housekeeping: Total Transfer Assistance: mod assist. Able to Take Stairs?: No Driving: No Vocation: Retired Comments: Pt told this therapist that she gets help with her bath and dressing,  tolieting and grooming.   Pt can wheel self in w/c wo assist. Communication Communication: No difficulties Dominant Hand: Right         Vision/Perception Vision - History Baseline Vision: Wears glasses only for reading Patient Visual Report: No change from baseline Vision - Assessment Vision Assessment: Vision not tested   Cognition  Cognition Arousal/Alertness: Awake/alert Behavior During Therapy: Anxious Overall Cognitive Status: History of cognitive impairments - at baseline    Extremity/Trunk Assessment Right Upper Extremity Assessment RUE ROM/Strength/Tone: Within functional levels RUE Sensation: WFL - Light Touch RUE Coordination: WFL - gross/fine motor Left Upper Extremity Assessment LUE ROM/Strength/Tone: Within functional levels LUE Sensation: WFL - Light Touch LUE Coordination: WFL - gross/fine motor Trunk Assessment Trunk Assessment: Kyphotic     Mobility Bed Mobility Bed Mobility: Supine to Sit Supine to Sit: 4: Min assist;HOB elevated;With rails Details for Bed Mobility Assistance: Some assist for trunk to attain sitting position, however pt able to assist herself using handrail to EOB.  Transfers Transfers: Sit to Stand;Stand to Sit Sit to Stand: 2: Max assist;With upper extremity assist;From bed Stand to Sit: 2: Max assist;To chair/3-in-1 Details for Transfer Assistance: Attempted to stand, however pt unable/refused to push up from bed and insisted on reaching around therapist to get to chair.  Able to tolerate squat pivot from bed to chair, however feel that she could have particiapted more based on bed mobiltiy abilities.      Exercise     Balance Balance Balance Assessed: Yes Static Sitting Balance Static Sitting - Balance Support: Bilateral upper extremity supported;Feet supported Static Sitting - Level of Assistance: 5: Stand by assistance Static Sitting - Comment/# of Minutes: 5 Static Standing Balance Static Standing - Balance Support: Bilateral upper extremity  supported Static Standing - Level of Assistance: 1: +1 Total assist   End of Session OT - End of Session Activity Tolerance: Other (comment) (limited by anxiety) Patient left: in chair;with call bell/phone within reach Nurse Communication: Mobility status  GO     Hope Budds 12/03/2012, 10:46 AM [phone]

## 2012-12-03 NOTE — Progress Notes (Signed)
Clinical Social Work Department BRIEF PSYCHOSOCIAL ASSESSMENT 12/03/2012  Patient:  [name], [name]     Account Number:  [id number]     Admit date:  11/29/2012  Clinical Social Worker:  Orpah Greek  Date/Time:  12/03/2012 12:09 PM  Referred by:  Physician  Date Referred:  12/03/2012 Referred for  Other - See comment   Other Referral:   Admitted from: [name]   Interview type:  Patient Other interview type:    PSYCHOSOCIAL DATA Living Status:  FACILITY Admitted from facility:  [name] Level of care:  Skilled Nursing Facility Primary support name:  Schwanda Zima (son) h#: [phone] c#: [id number] Primary support relationship to patient:  CHILD, ADULT Degree of support available:   good    CURRENT CONCERNS Current Concerns  Post-Acute Placement   Other Concerns:    SOCIAL WORK ASSESSMENT / PLAN CSW spoke with patient re: discharge planning. Patient was admitted from [name] and plans to return there at discharge.   Assessment/plan status:  Information/Referral to [facility] Other assessment/ plan:   Information/referral to community resources:   CSW completed FL2 and faxed information to [name] - confirmed with Eunice Blase that they would be able to take patient back when stable.    PATIENT'S/FAMILY'S RESPONSE TO PLAN OF CARE: Patient states that she lives there long term and has been pleased with the care she's been receiving there.       Unice Bailey, LCSW [facility] Clinical Social Worker cell #: [phone]

## 2012-12-03 NOTE — Progress Notes (Signed)
[facility] PROGRESS NOTE  [name] AVW:[id number] DOB: 04/23/1926 DOA: 11/29/2012 PCP: Herb Grays, MD  Assessment/Plan: Acute hypoxic respiratory failure with pulmonary infiltrates concerning for PNA. She is on chronic immunosuppression for inclusion body myositis.  -oxygen to keep SpO2 > 92%  -IV abx day #5- can change to PO at d/c   A fib with RVR in setting ofhemorrhagic/hypovolemic. Still has frequent PACs.  shock.-->resolved  -keep even  -hold ASA due to hematuria  -added back lopressor -monitor heart rhythm on tele    Acute kidney injury 2nd to hypovolemia.-->resolved   Hematuria with concern for bladder cancer. Having sig bladder spasms associated w/ passing bloody clots  D/c foley - follow up with urology as outpatient for cystoscopy  Nausea >> improved 5/26.  -PRN zofran  -Protonix IV for SUP  -SLP: DYS 1 diet   Acute hemorrhagic anemia from hematuria.   -f/u CBC  -transfuse for Hb < 7, or hemodynamic instability with active bleeding    Pulmonary infiltrates with concern for PNA.  Proteus UTI-  -Continue cefepime and azithromycin  -hold imuran due to concern for infection   Relative adrenal insufficiency with hx of chronic prednisone use.  Hx of hypothyroid.  P:  -added solucortef 5/26, weaning  -continue synthroid- change to PO    Hx of inclusion body myositis.  -hold imuran in setting of possible infection   Hx of anxiety, chronic pain.   -PRN ativan  -continue lidoderm        Code Status: full Family Communication:patient at bedside Disposition Plan: SNf tommorrow   Consultants:  urology  Procedures:    Antibiotics:    HPI/Subjective: Did not sleep well last night Agreeable to rehab   Objective: Filed Vitals:   12/02/12 1608 12/02/12 1753 12/02/12 2146 12/03/12 0446  BP: 116/74 128/79 98/56 103/67  Pulse: 108 103 104 95  Temp: 98 F (36.7 C)  98.2 F (36.8 C) 97.9 F (36.6 C)  TempSrc: Oral  Oral Oral   Resp: 18  16 16   Height: 5\' 4"  (1.626 m)     Weight: 60.9 kg (134 lb 4.2 oz)   60.5 kg (133 lb 6.1 oz)  SpO2: 100%  99% 100%    Intake/Output Summary (Last 24 hours) at 12/03/12 0839 Last data filed at 12/03/12 0700  Gross per 24 hour  Intake   4350 ml  Output   6975 ml  Net  -2625 ml   Filed Weights   12/01/12 0400 12/02/12 1608 12/03/12 0446  Weight: 55.9 kg (123 lb 3.8 oz) 60.9 kg (134 lb 4.2 oz) 60.5 kg (133 lb 6.1 oz)    Exam:   General:  A+Ox3, NAD  Cardiovascular: rrr  Respiratory: clear anterior  Abdomen: +BS, soft, NT  Musculoskeletal: moves all 4 ext-weak   Data Reviewed: Basic Metabolic Panel:  Recent Labs Lab 11/29/12 1650 11/29/12 2330 11/30/12 0341 12/01/12 0345 12/02/12 0855  NA 126* 132* 133* 133* 133*  K 5.1 4.5 4.2 4.0 4.0  CL 91* 99 102 103 105  CO2 21 20 20  17* 16*  GLUCOSE 90 73 80 89 98  BUN 58* 46* 44* 28* 23  CREATININE 1.90* 1.53* 1.45* 1.04 0.97  CALCIUM 8.9 7.9* 7.7* 8.0* 7.9*  MG  --   --   --  2.3  --   PHOS  --   --   --  3.6  --    Liver Function Tests:  Recent Labs Lab 11/29/12 1650 11/29/12 2330 12/01/12 0345  AST 31 22 25   ALT 13 11 12   ALKPHOS 84 73 71  BILITOT 0.3 0.5 0.3  PROT 6.4 5.8* 5.4*  ALBUMIN 2.6* 2.3* 2.1*   No results found for this basename: LIPASE, AMYLASE,  in the last 168 hours No results found for this basename: AMMONIA,  in the last 168 hours CBC:  Recent Labs Lab 11/29/12 1650 11/29/12 2330 11/30/12 0341 11/30/12 1015 12/01/12 0345 12/02/12 0855  WBC 8.5 8.7 8.1 8.7 6.0 7.1  NEUTROABS 7.6 7.1  --   --   --   --   HGB 6.0* 8.6* 8.4* 8.6* 8.4* 9.2*  HCT 18.2* 25.1* 24.9* 24.3* 25.4* 27.2*  MCV 98.9 90.9 91.2 91.4 92.4 94.4  PLT 340 255 255 240 215 226   Cardiac Enzymes:  Recent Labs Lab 11/29/12 1650 11/29/12 2330 11/30/12 0341  CKTOTAL  --  134  --   TROPONINI <0.30 <0.30 <0.30   BNP (last 3 results) No results found for this basename: PROBNP,  in the last 8760  hours CBG:  Recent Labs Lab 11/30/12 2023  GLUCAP 83    Recent Results (from the past 240 hour(s))  CULTURE, BLOOD (ROUTINE X 2)     Status: None   Collection Time    11/29/12  4:45 PM      Result Value Range Status   Specimen Description BLOOD LEFT ARM   Final   Special Requests     Final   Value: BOTTLES DRAWN AEROBIC AND ANAEROBIC 6CC EACH BOTTLE   Culture NO GROWTH 3 DAYS   Final   Report Status PENDING   Incomplete  URINE CULTURE     Status: None   Collection Time    11/29/12  4:49 PM      Result Value Range Status   Specimen Description URINE, CATHETERIZED   Final   Special Requests NONE   Final   Culture  Setup Time 11/29/2012 21:30   Final   Colony Count >=100,000 COLONIES/ML   Final   Culture PROTEUS MIRABILIS   Final   Report Status 12/03/2012 FINAL   Final   Organism ID, Bacteria PROTEUS MIRABILIS   Final  MRSA PCR SCREENING     Status: None   Collection Time    11/29/12  8:28 PM      Result Value Range Status   MRSA by PCR NEGATIVE  NEGATIVE Final   Comment:            The GeneXpert MRSA Assay (FDA     approved for NASAL specimens     only), is one component of a     comprehensive MRSA colonization     surveillance program. It is not     intended to diagnose MRSA     infection nor to guide or     monitor treatment for     MRSA infections.     Studies: No results found.  Scheduled Meds: . azithromycin  500 mg Intravenous Q24H  . ceFEPime (MAXIPIME) IV  1 g Intravenous Q24H  . [START ON 12/04/2012] hydrocortisone sod succinate (SOLU-CORTEF) inj  50 mg Intravenous Daily  . [START ON 12/04/2012] levothyroxine  112 mcg Oral QAC breakfast  . lidocaine  1 patch Transdermal Q24H  . metoprolol tartrate  12.5 mg Oral q morning - 10a  . oxybutynin  5 mg Oral QID  . pantoprazole (PROTONIX) IV  40 mg Intravenous Q24H   Continuous Infusions:    Active Problems:   Bladder tumor,  right dome   Hematuria   Hemorrhagic shock   Atrial fibrillation with RVR    HCAP (healthcare-associated pneumonia)    Time spent:35    Marlin Canary  [facility] Pager [phone]. If 7PM-7AM, please contact night-coverage at [email or web address], password [facility] 12/03/2012, 8:39 AM  LOS: 4 days

## 2012-12-03 NOTE — Care Management Note (Signed)
    Page 1 of 2   12/03/2012     2:55:00 PM   CARE MANAGEMENT NOTE 12/03/2012  Patient:  [name], [name]   Account Number:  [id number]  Date Initiated:  11/30/2012  Documentation initiated by:  DAVIS,RHONDA  Subjective/Objective Assessment:   pna, hypotensive, anemic, gross hematuria     Action/Plan:   private resident children are the support group   Anticipated DC Date:  12/04/2012   Anticipated DC Plan:  SKILLED NURSING FACILITY  In-house referral  Clinical Social Worker      DC Planning Services  CM consult      [facility] Choice  NA   Choice offered to / List presented to:  NA   DME arranged  NA      DME agency  NA     HH arranged  NA      HH agency  NA   Status of service:  Completed, signed off Medicare Important Message given?  NA - LOS <3 / Initial given by admissions (If response is "NO", the following Medicare IM given date fields will be blank) Date Medicare IM given:   Date Additional Medicare IM given:    Discharge Disposition:  SKILLED NURSING FACILITY  Per UR Regulation:  Reviewed for med. necessity/level of care/duration of stay  If discussed at Long Length of Stay Meetings, dates discussed:    Comments:  [id number] Earlene Plater, RN, BSN,CCM: CHART REVIEWED AND UPDATED.  Next chart review due on [id number]. NO DISCHARGE NEEDS PRESENT AT THIS TIME. CASE MANAGEMENT [phone]

## 2012-12-03 NOTE — Progress Notes (Signed)
Urine very clear Limited spasms Hb increased 9.2 Plan: D/c- D/c home as early as tomorrow am when ok with primary team-i will arrange outpt cysto Send home with cipro 250 mg bid for 3 days- Rx given to pt

## 2012-12-04 LAB — BASIC METABOLIC PANEL
Calcium: 7.9 mg/dL — ABNORMAL LOW (ref 8.4–10.5)
Chloride: 104 mEq/L (ref 96–112)
Creatinine, Ser: 0.91 mg/dL (ref 0.50–1.10)
GFR calc Af Amer: 65 mL/min — ABNORMAL LOW (ref >90)

## 2012-12-04 LAB — CBC
MCV: 92.4 fL (ref 78.0–100.0)
Platelets: 175 10*3/uL (ref 150–400)
RDW: 17.2 % — ABNORMAL HIGH (ref 11.5–15.5)
WBC: 6 10*3/uL (ref 4.0–10.5)

## 2012-12-04 MED ORDER — OXYBUTYNIN CHLORIDE 5 MG PO TABS: 5.0000 mg | ORAL_TABLET | Freq: Four times a day (QID) | ORAL | Status: AC

## 2012-12-04 MED ORDER — POTASSIUM CHLORIDE CRYS ER 20 MEQ PO TBCR: 20.0000 meq | EXTENDED_RELEASE_TABLET | Freq: Every day | ORAL | Status: DC

## 2012-12-04 MED ORDER — CIPROFLOXACIN HCL 250 MG PO TABS
250.0000 mg | ORAL_TABLET | Freq: Two times a day (BID) | ORAL | Status: DC
  Administered 2012-12-04: 250 mg via ORAL
  Filled 2012-12-04 (×3): qty 1

## 2012-12-04 MED ORDER — CIPROFLOXACIN HCL 250 MG PO TABS: 250.0000 mg | ORAL_TABLET | Freq: Two times a day (BID) | ORAL | Status: DC

## 2012-12-04 MED ORDER — ALPRAZOLAM 0.5 MG PO TABS: 0.5000 mg | ORAL_TABLET | Freq: Two times a day (BID) | ORAL | Status: DC | PRN

## 2012-12-04 MED ORDER — POTASSIUM CHLORIDE CRYS ER 20 MEQ PO TBCR
40.0000 meq | EXTENDED_RELEASE_TABLET | Freq: Once | ORAL | Status: AC
  Administered 2012-12-04: 40 meq via ORAL
  Filled 2012-12-04: qty 2

## 2012-12-04 MED ORDER — ALPRAZOLAM 0.25 MG PO TABS
0.2500 mg | ORAL_TABLET | Freq: Once | ORAL | Status: AC
  Administered 2012-12-04: 0.25 mg via ORAL
  Filled 2012-12-04: qty 1

## 2012-12-04 NOTE — Progress Notes (Signed)
Speech Language Pathology Dysphagia Treatment Patient Details Name: [name] MRN: [id number] DOB: Nov 13, 1925 Today's Date: 12/04/2012 Time: 1150-1220 SLP Time Calculation (min): 30 min  Assessment / Plan / Recommendation Clinical Impression  Assessment of diet tolerance and possible advancement to soft solids.  Pt. demonstrated a munching pattern with the cracker, had prolonged mastication, and poor bolus formation, with anterior spillage.  Required sips of water to form and propel bolus, A to P. Oral residue noted, and pt. required verbal cues to clear oral cavity.  Pt. took bites of puree and had no difficulty in the oral phase, or in triggering the swallow.  Pt. swallowed sips of water via a straw without difficulty.     Diet Recommendation  Continue with Current Diet: Dysphagia 1 (puree);Thin liquid    SLP Plan Discharge SLP treatment due to (comment);All goals met (and d/c to SNF)   Pertinent Vitals/Pain n/a   Swallowing Goals  SLP Swallowing Goals Swallow Study Goal #2 - Progress: Met  General Temperature Spikes Noted: No Respiratory Status: Room air Behavior/Cognition: Alert;Cooperative Oral Cavity - Dentition: Poor condition;Missing dentition Patient Positioning: Upright in bed  Oral Cavity - Oral Hygiene Does patient have any of the following "at risk" factors?: Lips - dry, cracked Patient is HIGH RISK - Oral Care Protocol followed (see row info): Yes Patient is AT RISK - Oral Care Protocol followed (see row info): Yes   Dysphagia Treatment Treatment focused on: Skilled observation of diet tolerance;Upgraded PO texture trials;Patient/family/caregiver education Family/Caregiver Educated: Pt Treatment Methods/Modalities: Skilled observation Patient observed directly with PO's: Yes Type of PO's observed: Dysphagia 3 (soft);Thin liquids Feeding: Able to feed self;Needs set up Liquids provided via: Straw Oral Phase Signs & Symptoms: Prolonged mastication;Prolonged  bolus formation;Prolonged oral phase;Anterior loss/spillage Type of cueing: Verbal Amount of cueing: Moderate   GO     [name] T 12/04/2012, 12:41 PM

## 2012-12-04 NOTE — Progress Notes (Signed)
Discharge to [facility] SN[facility] report given to IschelleRN. Patient alert and oriented a liitle bit anxious upon discharge. PIV removed no s/s of swelling/infiltration. P/u by transport personal.

## 2012-12-04 NOTE — Discharge Summary (Signed)
Physician Discharge Summary  [name] ZOX:[id number] DOB: 01/17/1926 DOA: 11/29/2012  PCP: Herb Grays, MD  Admit date: 11/29/2012 Discharge date: 12/04/2012  Time spent: 35 minutes  Recommendations for Outpatient Follow-up:  1. Outpatient cysto 2. SLP eval and advancement of diet 3. Hold ASA until after cysto  Discharge Diagnoses:  Active Problems:   Bladder tumor, right dome   Hematuria   Hemorrhagic shock   Atrial fibrillation with RVR   HCAP (healthcare-associated pneumonia)   Discharge Condition: improved  Diet recommendation: DYS 1  Filed Weights   12/01/12 0400 12/02/12 1608 12/03/12 0446  Weight: 55.9 kg (123 lb 3.8 oz) 60.9 kg (134 lb 4.2 oz) 60.5 kg (133 lb 6.1 oz)    History of present illness:  79 yo female presented to ED with progressive hematuria, abdominal pain, and hypotension with A fib and RVR, severe anemia, and concern for pneumonia. PCCM asked to admit to ICU. She has been referred to urology as outpt, but not yet evaluated for concerns of bladder cancer   Hospital Course:  Acute hypoxic respiratory failure with pulmonary infiltrates concerning for PNA. She is on chronic immunosuppression for inclusion body myositis.  -oxygen to keep SpO2 > 92%  -treated  A fib with RVR in setting ofhemorrhagic/hypovolemic. Still has frequent PACs.  shock.-->resolved  -keep even  -hold ASA due to hematuria- resume after cysto -added back lopressor  -monitor heart rhythm on tele   Acute kidney injury 2nd to hypovolemia.-->resolved   Hematuria with concern for bladder cancer. Having sig bladder spasms associated w/ passing bloody clots  D/c foley  - follow up with urology as outpatient for cystoscopy   Nausea >> improved 5/26.  -PRN zofran  -SLP: DYS 1 diet   Acute hemorrhagic anemia from hematuria.  -resolved  Pulmonary infiltrates with concern for PNA.  Proteus UTI-  -treated  Relative adrenal insufficiency with hx of chronic prednisone use.   Hx of hypothyroid.  P:  -added solucortef 5/26, d/c -continue synthroid- change to PO   Hx of inclusion body myositis.  -hold imuran in setting of possible infection   Hx of anxiety, chronic pain.  -PRN ativan  -continue lidoderm    Procedures:    Consultations:  urology  Discharge Exam: Filed Vitals:   12/03/12 1420 12/03/12 1817 12/03/12 2217 12/04/12 0500  BP: 113/63 104/69 125/72 121/83  Pulse: 48 114 102 63  Temp: 98.2 F (36.8 C)  98.5 F (36.9 C) 98.6 F (37 C)  TempSrc: Oral  Oral Oral  Resp: 18  20 22   Height:      Weight:      SpO2: 100%  100% 100%    General: A+Ox3, NAD Cardiovascular: rrr Respiratory: clear anterior  Discharge Instructions      Discharge Orders   Future Orders Complete By Expires     Discharge instructions  As directed     Comments:      BMP 1 week re K DYS 1    Increase activity slowly  As directed         Medication List    STOP taking these medications       aspirin 81 MG chewable tablet     traMADol 50 MG tablet  Commonly known as:  ULTRAM      TAKE these medications       acetaminophen 325 MG tablet  Commonly known as:  TYLENOL  Take 325 mg by mouth every 4 (four) hours as needed. The patient takes 2  tabs by mouth     albuterol 108 (90 BASE) MCG/ACT inhaler  Commonly known as:  PROVENTIL HFA;VENTOLIN HFA  Inhale 2 puffs into the lungs every 6 (six) hours as needed. Shortness of breath     ALPRAZolam 0.5 MG tablet  Commonly known as:  XANAX  Take 1 tablet (0.5 mg total) by mouth 2 (two) times daily as needed for sleep or anxiety.     alum & mag hydroxide-simeth 400-400-40 MG/5ML suspension  Commonly known as:  MAALOX PLUS  Take 5 mLs by mouth as needed for indigestion.     azathioprine 100 MG tablet  Commonly known as:  IMURAN  Take 100 mg by mouth daily.     azelastine 137 MCG/SPRAY nasal spray  Commonly known as:  ASTELIN  Place 1 spray into the nose 2 (two) times daily. Use in each nostril  as directed     ciprofloxacin 250 MG tablet  Commonly known as:  CIPRO  Take 1 tablet (250 mg total) by mouth 2 (two) times daily.     docusate sodium 100 MG capsule  Commonly known as:  COLACE  Take 100 mg by mouth daily.     feeding supplement Liqd  Take 30 mLs by mouth 2 (two) times daily.     fluticasone 50 MCG/ACT nasal spray  Commonly known as:  FLONASE  Place 2 sprays into the nose daily.     folic acid 1 MG tablet  Commonly known as:  FOLVITE  Take 1 tablet (1 mg total) by mouth daily.     guaifenesin 100 MG/5ML syrup  Commonly known as:  ROBITUSSIN  Take 200 mg by mouth every 6 (six) hours as needed for cough or congestion.     l-methylfolate-B6-B12 3-35-2 MG Tabs  Commonly known as:  METANX  Take 1 tablet by mouth 2 (two) times daily.     levothyroxine 112 MCG tablet  Commonly known as:  SYNTHROID, LEVOTHROID  Take 112 mcg by mouth daily before breakfast.     lidocaine 5 %  Commonly known as:  LIDODERM  Place 1 patch onto the skin daily. Remove & Discard patch within 12 hours or as directed by MD     loperamide 2 MG capsule  Commonly known as:  IMODIUM  Take 1 capsule (2 mg total) by mouth as needed for diarrhea or loose stools (Give after each loose BM, not to exceed eight capsules (16mg ) in 24hr).     loratadine 10 MG tablet  Commonly known as:  CLARITIN  Take 10 mg by mouth daily.     metoprolol tartrate 25 MG tablet  Commonly known as:  LOPRESSOR  Take 12.5 mg by mouth every morning.     mirtazapine 15 MG tablet  Commonly known as:  REMERON  Take 7.5 mg by mouth at bedtime.     multivitamin with minerals Tabs  Take 1 tablet by mouth daily.     omeprazole 20 MG capsule  Commonly known as:  PRILOSEC  Take 20 mg by mouth daily.     ondansetron 4 MG tablet  Commonly known as:  ZOFRAN  Take 1 tablet (4 mg total) by mouth every 8 (eight) hours as needed for nausea.     oxybutynin 5 MG tablet  Commonly known as:  DITROPAN  Take 1 tablet (5 mg  total) by mouth 4 (four) times daily.     PENNSAID 1.5 % Soln  Generic drug:  Diclofenac Sodium  Apply 10 drops topically  3 (three) times daily.     potassium chloride SA 20 MEQ tablet  Commonly known as:  K-DUR,KLOR-CON  Take 1 tablet (20 mEq total) by mouth daily.  Start taking on:  12/05/2012     predniSONE 10 MG tablet  Commonly known as:  DELTASONE  Take 1 tablet (10 mg total) by mouth daily with breakfast.     RESOURCE 2.0 PO  Take 1 each by mouth 2 (two) times daily.       Allergies  Allergen Reactions  . Codeine Other (See Comments)    unknown  . Morphine And Related Other (See Comments)    unknown  . Piroxicam Other (See Comments)    unknown   Follow-up Information   Follow up with Herb Grays, MD In 1 week.   Contact information:   [address] [place] [address] [phone]       Follow up with MACDIARMID,SCOTT A, MD. (for outpatient cysto)    Contact information:   [address] [facility] [place] [address] [phone]        The results of significant diagnostics from this hospitalization (including imaging, microbiology, ancillary and laboratory) are listed below for reference.    Significant Diagnostic Studies: Ct Abdomen Pelvis Wo Contrast  11/30/2012   *RADIOLOGY REPORT*  Clinical Data:  Pulmonary infiltrates, abdominal pain, hematuria with concern for bladder cancer, acute renal failure  CT CHEST, ABDOMEN AND PELVIS WITHOUT CONTRAST  Technique:  Multidetector CT imaging of the chest, abdomen and pelvis was performed following the standard protocol without IV contrast.  Comparison:  Abdominal radiographs dated 11/30/2012.  Chest radiograph dated 11/29/2012.  CT CHEST  Findings:  Wedge-shaped patchy/ground-glass opacities in the superior segment right lower lobe (series 4/image 27) and lingula (series 4/image 35).  Additional patchy opacities in the lateral right middle  lobe (series 4/image 30) and lung bases.  Associated small bilateral pleural effusions.  No pneumothorax.  Cardiomegaly.  No pericardial effusion.  Coronary atherosclerosis. Prosthetic mitral valve.  Atherosclerotic calcifications of the aortic arch.  No suspicious mediastinal or axillary lymphadenopathy.  IMPRESSION: Multifocal patchy opacities, as described above, suspicious for pneumonia, possibly on the basis of aspiration.  In the absence of infectious symptoms, early pulmonary infarcts related to pulmonary emboli would be an additional consideration, although this is considered less likely.  Small bilateral pleural effusions with associated bibasilar atelectasis.  CT ABDOMEN AND PELVIS  Findings:  Unenhanced liver, pancreas, and adrenal glands are within normal limits.  Scattered splenic granulomata.  Gallbladder is unremarkable.  No intrahepatic or extrahepatic ductal dilatation.  2 mm nonobstructing right upper pole renal calculus (series 2/image 61).  No hydronephrosis.  No evidence of bowel obstruction.  Normal appendix.  A long segment of transverse and proximal descending colon is mildly thick-walled but underdistended.  Mild rectal wall thickening (series 2/image 118).  Atherosclerotic calcifications of the abdominal aorta and branch vessels.  No suspicious abdominopelvic lymphadenopathy.  Bladder is poorly evaluated due to streak artifact from the patient's bilateral hip prostheses.  Nondependent gas and indwelling Foley catheter.  Suspected layering gross hemorrhage (series 2/image 98).  Degenerative changes of the visualized thoracolumbar spine. Bilateral total hip arthroplasties.  IMPRESSION: Suspected layering gross hemorrhage in the bladder.  Bladder is otherwise poorly evaluated due to streak artifact.  2 mm nonobstructing right upper pole renal calculus.  No hydronephrosis.  Rectal wall thickening, nonspecific.  Correlate with digital rectal exam or proctoscopy.  Additional ancillary findings as  above.   Original Report Authenticated By: Charline Bills, M.D.   Ct Chest Wo Contrast  11/30/2012   *RADIOLOGY REPORT*  Clinical Data:  Pulmonary infiltrates, abdominal pain, hematuria with concern for bladder cancer, acute renal failure  CT CHEST, ABDOMEN AND PELVIS WITHOUT CONTRAST  Technique:  Multidetector CT imaging of the chest, abdomen and pelvis was performed following the standard protocol without IV contrast.  Comparison:  Abdominal radiographs dated 11/30/2012.  Chest radiograph dated 11/29/2012.  CT CHEST  Findings:  Wedge-shaped patchy/ground-glass opacities in the superior segment right lower lobe (series 4/image 27) and lingula (series 4/image 35).  Additional patchy opacities in the lateral right middle lobe (series 4/image 30) and lung bases.  Associated small bilateral pleural effusions.  No pneumothorax.  Cardiomegaly.  No pericardial effusion.  Coronary atherosclerosis. Prosthetic mitral valve.  Atherosclerotic calcifications of the aortic arch.  No suspicious mediastinal or axillary lymphadenopathy.  IMPRESSION: Multifocal patchy opacities, as described above, suspicious for pneumonia, possibly on the basis of aspiration.  In the absence of infectious symptoms, early pulmonary infarcts related to pulmonary emboli would be an additional consideration, although this is considered less likely.  Small bilateral pleural effusions with associated bibasilar atelectasis.  CT ABDOMEN AND PELVIS  Findings:  Unenhanced liver, pancreas, and adrenal glands are within normal limits.  Scattered splenic granulomata.  Gallbladder is unremarkable.  No intrahepatic or extrahepatic ductal dilatation.  2 mm nonobstructing right upper pole renal calculus (series 2/image 61).  No hydronephrosis.  No evidence of bowel obstruction.  Normal appendix.  A long segment of transverse and proximal descending colon is mildly thick-walled but underdistended.  Mild rectal wall thickening (series 2/image 118).   Atherosclerotic calcifications of the abdominal aorta and branch vessels.  No suspicious abdominopelvic lymphadenopathy.  Bladder is poorly evaluated due to streak artifact from the patient's bilateral hip prostheses.  Nondependent gas and indwelling Foley catheter.  Suspected layering gross hemorrhage (series 2/image 98).  Degenerative changes of the visualized thoracolumbar spine. Bilateral total hip arthroplasties.  IMPRESSION: Suspected layering gross hemorrhage in the bladder.  Bladder is otherwise poorly evaluated due to streak artifact.  2 mm nonobstructing right upper pole renal calculus.  No hydronephrosis.  Rectal wall thickening, nonspecific.  Correlate with digital rectal exam or proctoscopy.  Additional ancillary findings as above.   Original Report Authenticated By: Charline Bills, M.D.   Dg Chest Port 1 View  11/29/2012   *RADIOLOGY REPORT*  Clinical Data: Shortness of breath.  PORTABLE CHEST - 1 VIEW 11/29/2012 1638 hours:  Comparison: One-view chest x-ray 05/04/2012.  Two-view chest x-ray 07/05/2010.  Findings: Cardiac silhouette normal in size for AP portable technique.  Mitral annular calcification.  Thoracic aorta atherosclerotic, unchanged.  Hilar and mediastinal contours otherwise unremarkable.  Emphysematous changes throughout both lungs.  Patchy airspace opacities in the right mid lung and the left lung base, new since the prior examination.  IMPRESSION: Pneumonia involving the right mid lung and left lung base, superimposed upon COPD/emphysema.   Original Report Authenticated By: Hulan Saas, M.D.   Dg Abd Portable 2v  11/30/2012   *RADIOLOGY REPORT*  Clinical Data: Abdominal pain  PORTABLE ABDOMEN - 2 VIEW  Comparison: 05/04/2012; chest radiograph - 05/01/2013  Findings:  Nonobstructive bowel gas pattern.  No pneumoperitoneum, pneumatosis or portal venous gas.  No definite abnormal intra-abdominal calcifications.  Post bilateral total hip replacements, incompletely evaluated.  Multilevel lumbar spine degenerative changes with associated presumably degenerative scoliotic curvature of the lumbar spine, convex to  the left.  IMPRESSION: Nonobstructive bowel gas pattern.   Original Report Authenticated By: Tacey Ruiz, MD    Microbiology: Recent Results (from the past 240 hour(s))  CULTURE, BLOOD (ROUTINE X 2)     Status: None   Collection Time    11/29/12  4:45 PM      Result Value Range Status   Specimen Description BLOOD LEFT ARM   Final   Special Requests     Final   Value: BOTTLES DRAWN AEROBIC AND ANAEROBIC 6CC EACH BOTTLE   Culture NO GROWTH 4 DAYS   Final   Report Status PENDING   Incomplete  URINE CULTURE     Status: None   Collection Time    11/29/12  4:49 PM      Result Value Range Status   Specimen Description URINE, CATHETERIZED   Final   Special Requests NONE   Final   Culture  Setup Time 11/29/2012 21:30   Final   Colony Count >=100,000 COLONIES/ML   Final   Culture PROTEUS MIRABILIS   Final   Report Status 12/03/2012 FINAL   Final   Organism ID, Bacteria PROTEUS MIRABILIS   Final  MRSA PCR SCREENING     Status: None   Collection Time    11/29/12  8:28 PM      Result Value Range Status   MRSA by PCR NEGATIVE  NEGATIVE Final   Comment:            The GeneXpert MRSA Assay (FDA     approved for NASAL specimens     only), is one component of a     comprehensive MRSA colonization     surveillance program. It is not     intended to diagnose MRSA     infection nor to guide or     monitor treatment for     MRSA infections.     Labs: Basic Metabolic Panel:  Recent Labs Lab 11/29/12 2330 11/30/12 0341 12/01/12 0345 12/02/12 0855 12/04/12 0504  NA 132* 133* 133* 133* 133*  K 4.5 4.2 4.0 4.0 3.1*  CL 99 102 103 105 104  CO2 20 20 17* 16* 18*  GLUCOSE 73 80 89 98 78  BUN 46* 44* 28* 23 18  CREATININE 1.53* 1.45* 1.04 0.97 0.91  CALCIUM 7.9* 7.7* 8.0* 7.9* 7.9*  MG  --   --  2.3  --   --   PHOS  --   --  3.6  --   --    Liver  Function Tests:  Recent Labs Lab 11/29/12 1650 11/29/12 2330 12/01/12 0345  AST 31 22 25   ALT 13 11 12   ALKPHOS 84 73 71  BILITOT 0.3 0.5 0.3  PROT 6.4 5.8* 5.4*  ALBUMIN 2.6* 2.3* 2.1*   No results found for this basename: LIPASE, AMYLASE,  in the last 168 hours No results found for this basename: AMMONIA,  in the last 168 hours CBC:  Recent Labs Lab 11/29/12 1650 11/29/12 2330 11/30/12 0341 11/30/12 1015 12/01/12 0345 12/02/12 0855 12/04/12 0504  WBC 8.5 8.7 8.1 8.7 6.0 7.1 6.0  NEUTROABS 7.6 7.1  --   --   --   --   --   HGB 6.0* 8.6* 8.4* 8.6* 8.4* 9.2* 9.0*  HCT 18.2* 25.1* 24.9* 24.3* 25.4* 27.2* 26.8*  MCV 98.9 90.9 91.2 91.4 92.4 94.4 92.4  PLT 340 255 255 240 215 226 175   Cardiac Enzymes:  Recent Labs Lab 11/29/12 1650  11/29/12 2330 11/30/12 0341  CKTOTAL  --  134  --   TROPONINI <0.30 <0.30 <0.30   BNP: BNP (last 3 results) No results found for this basename: PROBNP,  in the last 8760 hours CBG:  Recent Labs Lab 11/30/12 2023  GLUCAP 83       Signed:  Chanti Golubski  [facility] 12/04/2012, 10:31 AM

## 2012-12-04 NOTE — Progress Notes (Signed)
Patient is set to discharge back to [name] today. Patient & son, Onalee Hua (ph#: [phone]) made aware. Discharge packet in [place]. PTAR scheduled for 12:30 pickup.   Unice Bailey, LCSW [facility] Clinical Social Worker cell #: [phone]

## 2012-12-06 LAB — CULTURE, BLOOD (ROUTINE X 2)

## 2013-08-13 ENCOUNTER — Other Ambulatory Visit: Payer: Self-pay | Admitting: *Deleted

## 2013-08-13 ENCOUNTER — Encounter: Payer: Self-pay | Admitting: Vascular Surgery

## 2013-08-13 DIAGNOSIS — I739 Peripheral vascular disease, unspecified: Secondary | ICD-10-CM

## 2013-08-16 ENCOUNTER — Ambulatory Visit (HOSPITAL_COMMUNITY)
Admission: RE | Admit: 2013-08-16 | Discharge: 2013-08-16 | Disposition: A | Discharge status: Home / Self Care | Payer: Medicare Other | Source: Ambulatory Visit | Attending: Vascular Surgery | Admitting: Vascular Surgery

## 2013-08-16 ENCOUNTER — Ambulatory Visit (INDEPENDENT_AMBULATORY_CARE_PROVIDER_SITE_OTHER): Payer: Medicare Other | Admitting: Vascular Surgery

## 2013-08-16 ENCOUNTER — Encounter: Payer: Self-pay | Admitting: Vascular Surgery

## 2013-08-16 ENCOUNTER — Encounter (INDEPENDENT_AMBULATORY_CARE_PROVIDER_SITE_OTHER): Payer: Self-pay

## 2013-08-16 ENCOUNTER — Encounter: Payer: Self-pay | Admitting: Surgery

## 2013-08-16 ENCOUNTER — Telehealth: Payer: Self-pay | Admitting: Vascular Surgery

## 2013-08-16 VITALS — BP 80/51 | HR 102 | Resp 16 | Ht 62.0 in | Wt 140.0 lb

## 2013-08-16 DIAGNOSIS — I739 Peripheral vascular disease, unspecified: Secondary | ICD-10-CM

## 2013-08-16 DIAGNOSIS — Z48812 Encounter for surgical aftercare following surgery on the circulatory system: Secondary | ICD-10-CM

## 2013-08-16 DIAGNOSIS — M79609 Pain in unspecified limb: Secondary | ICD-10-CM | POA: Insufficient documentation

## 2013-08-16 NOTE — Addendum Note (Signed)
Addended by: Dorthula Rue L on: 08/16/2013 11:59 AM   Modules accepted: Orders

## 2013-08-16 NOTE — Telephone Encounter (Signed)
Spoke with Patsy from transportation. Gave her time and date of upcoming appt CTA - 11:15 on 09/14/13 Dr. Kellie Simmering 1:45 pm on 09/14/13 Labs - to be done at [facility] on 09/10/13 Patsy verbalized understanding - said to send paperwork to att: [facility] Paperwork sent 08/16/13

## 2013-08-16 NOTE — Progress Notes (Signed)
Subjective:     Patient ID: [name], female   DOB: [birth year], 80 y.o.   MRN: [id number]  HPI this 80 year old female was referred from [place] and reexplore she has been a resident for at least one year. She has been nonambulatory for 7 years is in a wheelchair. She is accompanied by her son. She has ulcerations in both lower extremities. They're not certain how long these have been present. Left leg has ulceration and posterior laterally in the calf the right leg in the pretibial region proximally below the knee. Patient's son does not know why she cannot ambulate-he states she has a rare muscle disease. Records do not indicate what this would be.  Past Medical History  Diagnosis Date  . Hypothyroidism   . Atrial fibrillation   . Myositis     BODY  . LVH (left ventricular hypertrophy)     MILD  . Dizziness   . Headache(784.0)   . Weakness   . Chest pain   . DOE (dyspnea on exertion)   . Poor appetite   . UTI (urinary tract infection)   . Dysphagia     History of dysphagia 2, depressed by esophagus status post dilation  . Arthritis   . Hypokalemia   . History of anxiety   . Orthostatic hypotension   . Inclusion body myositis   . Easy bruising   . Complication of anesthesia     anxiety before anesthesia, wants spinal  . Myalgia and myositis, unspecified 04/30/2011  . Malnutrition/Cachexia 05/05/2012    Pre-morbid body weight 150 lbs, 04/2012 <100 lbs   . Failure to thrive 05/05/2012  . Macrocytosis 05/05/2012  . Diarrhea 05/05/2012  . Anxiety     History  Substance Use Topics  . Smoking status: Never Smoker   . Smokeless tobacco: Never Used  . Alcohol Use: No    No family history on file.  Allergies  Allergen Reactions  . Codeine Other (See Comments)    unknown  . Morphine And Related Other (See Comments)    unknown  . Piroxicam Other (See Comments)    unknown    Current outpatient prescriptions:acetaminophen (TYLENOL) 325 MG tablet,  Take 325 mg by mouth every 4 (four) hours as needed. The patient takes 2 tabs by mouth, Disp: , Rfl: ;  albuterol (PROVENTIL HFA;VENTOLIN HFA) 108 (90 BASE) MCG/ACT inhaler, Inhale 2 puffs into the lungs every 6 (six) hours as needed. Shortness of breath, Disp: , Rfl:  ALPRAZolam (XANAX) 0.5 MG tablet, Take 1 tablet (0.5 mg total) by mouth 2 (two) times daily as needed for sleep or anxiety., Disp: , Rfl: 0;  alum & mag hydroxide-simeth (MAALOX PLUS) 400-400-40 MG/5ML suspension, Take 5 mLs by mouth as needed for indigestion. , Disp: , Rfl: ;  azathioprine (IMURAN) 100 MG tablet, Take 100 mg by mouth daily., Disp: , Rfl:  azelastine (ASTELIN) 137 MCG/SPRAY nasal spray, Place 1 spray into the nose 2 (two) times daily. Use in each nostril as directed, Disp: , Rfl: ;  ciprofloxacin (CIPRO) 250 MG tablet, Take 1 tablet (250 mg total) by mouth 2 (two) times daily., Disp: , Rfl: ;  docusate sodium (COLACE) 100 MG capsule, Take 100 mg by mouth daily., Disp: , Rfl:  feeding supplement (PRO-STAT SUGAR FREE 64) LIQD, Take 30 mLs by mouth 2 (two) times daily., Disp: 900 mL, Rfl: ;  fluticasone (FLONASE) 50 MCG/ACT nasal spray, Place 2 sprays into the nose daily., Disp: , Rfl: ;  folic acid (FOLVITE) 1 MG tablet, Take 1 tablet (1 mg total) by mouth daily., Disp: , Rfl: ;  guaifenesin (ROBITUSSIN) 100 MG/5ML syrup, Take 200 mg by mouth every 6 (six) hours as needed for cough or congestion. , Disp: , Rfl:  l-methylfolate-B6-B12 (METANX) 3-35-2 MG TABS, Take 1 tablet by mouth 2 (two) times daily., Disp: , Rfl: ;  levothyroxine (SYNTHROID, LEVOTHROID) 112 MCG tablet, Take 112 mcg by mouth daily before breakfast., Disp: , Rfl: ;  lidocaine (LIDODERM) 5 %, Place 1 patch onto the skin daily. Remove & Discard patch within 12 hours or as directed by MD, Disp: , Rfl:  loperamide (IMODIUM) 2 MG capsule, Take 1 capsule (2 mg total) by mouth as needed for diarrhea or loose stools (Give after each loose BM, not to exceed eight capsules  (16mg ) in 24hr)., Disp: 30 capsule, Rfl: ;  loratadine (CLARITIN) 10 MG tablet, Take 10 mg by mouth daily., Disp: , Rfl: ;  metoprolol tartrate (LOPRESSOR) 25 MG tablet, Take 12.5 mg by mouth every morning., Disp: , Rfl:  mirtazapine (REMERON) 15 MG tablet, Take 7.5 mg by mouth at bedtime., Disp: , Rfl: ;  Multiple Vitamin (MULTIVITAMIN WITH MINERALS) TABS, Take 1 tablet by mouth daily., Disp: , Rfl: ;  Nutritional Supplements (RESOURCE 2.0 PO), Take 1 each by mouth 2 (two) times daily. , Disp: , Rfl: ;  omeprazole (PRILOSEC) 20 MG capsule, Take 20 mg by mouth daily., Disp: , Rfl:  ondansetron (ZOFRAN) 4 MG tablet, Take 1 tablet (4 mg total) by mouth every 8 (eight) hours as needed for nausea., Disp: 30 tablet, Rfl: 1;  oxybutynin (DITROPAN) 5 MG tablet, Take 1 tablet (5 mg total) by mouth 4 (four) times daily., Disp: , Rfl: ;  PENNSAID 1.5 % SOLN, Apply 10 drops topically 3 (three) times daily. , Disp: , Rfl: ;  potassium chloride SA (K-DUR,KLOR-CON) 20 MEQ tablet, Take 1 tablet (20 mEq total) by mouth daily., Disp: , Rfl:  predniSONE (DELTASONE) 10 MG tablet, Take 1 tablet (10 mg total) by mouth daily with breakfast., Disp: , Rfl:   BP 80/51  Pulse 102  Resp 16  Ht 5\' 2"  (1.575 m)  Wt 140 lb (63.504 kg)  BMI 25.60 kg/m2  Body mass index is 25.6 kg/(m^2).          Review of Systems denies generalized symptoms but is not ambulatory and generally weak no other specific symptoms such as chest pain dyspnea on exertion PND orthopnea. He has significant weight loss. Other systems negative    Objective:   Physical Exam BP 80/51  Pulse 102  Resp 16  Ht 5\' 2"  (1.575 m)  Wt 140 lb (63.504 kg)  BMI 25.60 kg/m2  Gen.-alert and oriented x3 in no apparent distress-cachectic-frail in appearance  HEENT normal for age Lungs no rhonchi or wheezing Cardiovascular regular rhythm no murmurs carotid pulses 3+ palpable no bruits audible Abdomen soft nontender no palpable masses Musculoskeletal free of   major deformities Skin clear -superficial ulceration left lateral calf posteriorly over about a 3-5 cm area with no infection Right leg has superficial ulceration below the knee anterolaterally.  Neurologic normal Lower extremities absent femoral and distal pulses bilaterally. Doppler study in our lab revealed monophasic flow at the femoral and distal levels. No flow heard in the left leg. Right leg has index of 0.72 in the dorsalis pedis.        Assessment:     Nonambulatory patient with ulcerations both lower extremities. Her  right leg appears possibly traumatic in origin and left leg possibly traumatic in origin. Patient with severe aortoiliac and femoral popliteal and tibial occlusive disease most likely-not a candidate for any operative procedure-cachexia    Plan:     Will obtain CT angiogram of abdomen and lower extremities to see if she is candidate for PTA and or stenting for inflow improvement if this is not feasible patient is not operative candidate for revascularization. Will return in 4 weeks

## 2013-08-18 ENCOUNTER — Inpatient Hospital Stay (HOSPITAL_COMMUNITY)
Admission: EM | Admit: 2013-08-18 | Discharge: 2013-08-30 | DRG: 871 | Disposition: A | Discharge status: Skilled Nursing Facility | Payer: Medicare Other | Attending: Internal Medicine | Admitting: Internal Medicine

## 2013-08-18 ENCOUNTER — Emergency Department (HOSPITAL_COMMUNITY): Payer: Medicare Other

## 2013-08-18 ENCOUNTER — Encounter (HOSPITAL_COMMUNITY): Payer: Self-pay | Admitting: Emergency Medicine

## 2013-08-18 DIAGNOSIS — R627 Adult failure to thrive: Secondary | ICD-10-CM | POA: Diagnosis present

## 2013-08-18 DIAGNOSIS — E869 Volume depletion, unspecified: Secondary | ICD-10-CM | POA: Diagnosis present

## 2013-08-18 DIAGNOSIS — K920 Hematemesis: Secondary | ICD-10-CM | POA: Diagnosis present

## 2013-08-18 DIAGNOSIS — F0390 Unspecified dementia without behavioral disturbance: Secondary | ICD-10-CM | POA: Diagnosis present

## 2013-08-18 DIAGNOSIS — D696 Thrombocytopenia, unspecified: Secondary | ICD-10-CM

## 2013-08-18 DIAGNOSIS — L89609 Pressure ulcer of unspecified heel, unspecified stage: Secondary | ICD-10-CM | POA: Diagnosis present

## 2013-08-18 DIAGNOSIS — I5189 Other ill-defined heart diseases: Secondary | ICD-10-CM

## 2013-08-18 DIAGNOSIS — Z79899 Other long term (current) drug therapy: Secondary | ICD-10-CM

## 2013-08-18 DIAGNOSIS — R6521 Severe sepsis with septic shock: Secondary | ICD-10-CM

## 2013-08-18 DIAGNOSIS — E870 Hyperosmolality and hypernatremia: Secondary | ICD-10-CM

## 2013-08-18 DIAGNOSIS — D175 Benign lipomatous neoplasm of intra-abdominal organs: Secondary | ICD-10-CM | POA: Diagnosis present

## 2013-08-18 DIAGNOSIS — E872 Acidosis, unspecified: Secondary | ICD-10-CM

## 2013-08-18 DIAGNOSIS — G7241 Inclusion body myositis [IBM]: Secondary | ICD-10-CM

## 2013-08-18 DIAGNOSIS — IMO0001 Reserved for inherently not codable concepts without codable children: Secondary | ICD-10-CM | POA: Diagnosis present

## 2013-08-18 DIAGNOSIS — R6520 Severe sepsis without septic shock: Secondary | ICD-10-CM

## 2013-08-18 DIAGNOSIS — M129 Arthropathy, unspecified: Secondary | ICD-10-CM | POA: Diagnosis present

## 2013-08-18 DIAGNOSIS — D494 Neoplasm of unspecified behavior of bladder: Secondary | ICD-10-CM

## 2013-08-18 DIAGNOSIS — I480 Paroxysmal atrial fibrillation: Secondary | ICD-10-CM

## 2013-08-18 DIAGNOSIS — E875 Hyperkalemia: Secondary | ICD-10-CM | POA: Diagnosis present

## 2013-08-18 DIAGNOSIS — E43 Unspecified severe protein-calorie malnutrition: Secondary | ICD-10-CM | POA: Insufficient documentation

## 2013-08-18 DIAGNOSIS — I5023 Acute on chronic systolic (congestive) heart failure: Secondary | ICD-10-CM | POA: Diagnosis not present

## 2013-08-18 DIAGNOSIS — R7989 Other specified abnormal findings of blood chemistry: Secondary | ICD-10-CM

## 2013-08-18 DIAGNOSIS — F418 Other specified anxiety disorders: Secondary | ICD-10-CM

## 2013-08-18 DIAGNOSIS — W19XXXA Unspecified fall, initial encounter: Secondary | ICD-10-CM

## 2013-08-18 DIAGNOSIS — K922 Gastrointestinal hemorrhage, unspecified: Secondary | ICD-10-CM | POA: Diagnosis present

## 2013-08-18 DIAGNOSIS — K449 Diaphragmatic hernia without obstruction or gangrene: Secondary | ICD-10-CM | POA: Diagnosis present

## 2013-08-18 DIAGNOSIS — IMO0002 Reserved for concepts with insufficient information to code with codable children: Secondary | ICD-10-CM

## 2013-08-18 DIAGNOSIS — I5022 Chronic systolic (congestive) heart failure: Secondary | ICD-10-CM

## 2013-08-18 DIAGNOSIS — I519 Heart disease, unspecified: Secondary | ICD-10-CM

## 2013-08-18 DIAGNOSIS — I509 Heart failure, unspecified: Secondary | ICD-10-CM | POA: Diagnosis not present

## 2013-08-18 DIAGNOSIS — K5710 Diverticulosis of small intestine without perforation or abscess without bleeding: Secondary | ICD-10-CM | POA: Diagnosis present

## 2013-08-18 DIAGNOSIS — R1310 Dysphagia, unspecified: Secondary | ICD-10-CM

## 2013-08-18 DIAGNOSIS — Z66 Do not resuscitate: Secondary | ICD-10-CM | POA: Diagnosis present

## 2013-08-18 DIAGNOSIS — B49 Unspecified mycosis: Secondary | ICD-10-CM | POA: Diagnosis present

## 2013-08-18 DIAGNOSIS — E039 Hypothyroidism, unspecified: Secondary | ICD-10-CM

## 2013-08-18 DIAGNOSIS — I4891 Unspecified atrial fibrillation: Secondary | ICD-10-CM | POA: Diagnosis present

## 2013-08-18 DIAGNOSIS — E860 Dehydration: Secondary | ICD-10-CM

## 2013-08-18 DIAGNOSIS — I428 Other cardiomyopathies: Secondary | ICD-10-CM | POA: Diagnosis present

## 2013-08-18 DIAGNOSIS — Z7401 Bed confinement status: Secondary | ICD-10-CM

## 2013-08-18 DIAGNOSIS — A419 Sepsis, unspecified organism: Principal | ICD-10-CM | POA: Diagnosis present

## 2013-08-18 DIAGNOSIS — Z8249 Family history of ischemic heart disease and other diseases of the circulatory system: Secondary | ICD-10-CM

## 2013-08-18 DIAGNOSIS — R578 Other shock: Secondary | ICD-10-CM

## 2013-08-18 DIAGNOSIS — D62 Acute posthemorrhagic anemia: Secondary | ICD-10-CM

## 2013-08-18 DIAGNOSIS — N179 Acute kidney failure, unspecified: Secondary | ICD-10-CM | POA: Diagnosis present

## 2013-08-18 DIAGNOSIS — I255 Ischemic cardiomyopathy: Secondary | ICD-10-CM

## 2013-08-18 DIAGNOSIS — R296 Repeated falls: Secondary | ICD-10-CM

## 2013-08-18 DIAGNOSIS — E46 Unspecified protein-calorie malnutrition: Secondary | ICD-10-CM

## 2013-08-18 DIAGNOSIS — Z96649 Presence of unspecified artificial hip joint: Secondary | ICD-10-CM

## 2013-08-18 DIAGNOSIS — R778 Other specified abnormalities of plasma proteins: Secondary | ICD-10-CM

## 2013-08-18 DIAGNOSIS — I739 Peripheral vascular disease, unspecified: Secondary | ICD-10-CM

## 2013-08-18 DIAGNOSIS — I214 Non-ST elevation (NSTEMI) myocardial infarction: Secondary | ICD-10-CM

## 2013-08-18 DIAGNOSIS — J189 Pneumonia, unspecified organism: Secondary | ICD-10-CM

## 2013-08-18 DIAGNOSIS — L8992 Pressure ulcer of unspecified site, stage 2: Secondary | ICD-10-CM | POA: Diagnosis present

## 2013-08-18 DIAGNOSIS — N390 Urinary tract infection, site not specified: Secondary | ICD-10-CM

## 2013-08-18 DIAGNOSIS — L89109 Pressure ulcer of unspecified part of back, unspecified stage: Secondary | ICD-10-CM | POA: Diagnosis present

## 2013-08-18 DIAGNOSIS — F411 Generalized anxiety disorder: Secondary | ICD-10-CM | POA: Diagnosis present

## 2013-08-18 DIAGNOSIS — F341 Dysthymic disorder: Secondary | ICD-10-CM

## 2013-08-18 HISTORY — DX: Paroxysmal atrial fibrillation: I48.0

## 2013-08-18 LAB — CBC
HCT: 29.5 % — ABNORMAL LOW (ref 36.0–46.0)
HEMOGLOBIN: 9.5 g/dL — AB (ref 12.0–15.0)
MCH: 33.2 pg (ref 26.0–34.0)
MCHC: 32.2 g/dL (ref 30.0–36.0)
MCV: 103.1 fL — ABNORMAL HIGH (ref 78.0–100.0)
Platelets: 158 10*3/uL (ref 150–400)
RBC: 2.86 MIL/uL — AB (ref 3.87–5.11)
RDW: 15.6 % — ABNORMAL HIGH (ref 11.5–15.5)
WBC: 16.9 10*3/uL — AB (ref 4.0–10.5)

## 2013-08-18 LAB — URINALYSIS, ROUTINE W REFLEX MICROSCOPIC
BILIRUBIN URINE: NEGATIVE
Glucose, UA: NEGATIVE mg/dL
Ketones, ur: NEGATIVE mg/dL
NITRITE: NEGATIVE
PH: 5 (ref 5.0–8.0)
SPECIFIC GRAVITY, URINE: 1.02 (ref 1.005–1.030)
Urobilinogen, UA: 0.2 mg/dL (ref 0.0–1.0)

## 2013-08-18 LAB — URINE MICROSCOPIC-ADD ON

## 2013-08-18 LAB — PREPARE RBC (CROSSMATCH)

## 2013-08-18 LAB — MRSA PCR SCREENING: MRSA by PCR: NEGATIVE

## 2013-08-18 LAB — BASIC METABOLIC PANEL
BUN: 57 mg/dL — ABNORMAL HIGH (ref 6–23)
CO2: 19 meq/L (ref 19–32)
Calcium: 8.8 mg/dL (ref 8.4–10.5)
Chloride: 103 mEq/L (ref 96–112)
Creatinine, Ser: 1.52 mg/dL — ABNORMAL HIGH (ref 0.50–1.10)
GFR calc Af Amer: 35 mL/min — ABNORMAL LOW (ref >90)
GFR, EST NON AFRICAN AMERICAN: 30 mL/min — AB (ref >90)
GLUCOSE: 110 mg/dL — AB (ref 70–99)
POTASSIUM: 5.5 meq/L — AB (ref 3.7–5.3)
SODIUM: 135 meq/L — AB (ref 137–147)

## 2013-08-18 LAB — OCCULT BLOOD, POC DEVICE: FECAL OCCULT BLD: POSITIVE — AB

## 2013-08-18 LAB — HEMOGLOBIN AND HEMATOCRIT, BLOOD
HEMATOCRIT: 32.5 % — AB (ref 36.0–46.0)
Hemoglobin: 10.6 g/dL — ABNORMAL LOW (ref 12.0–15.0)

## 2013-08-18 MED ORDER — SODIUM CHLORIDE 0.9 % IJ SOLN: 10.0000 mL | INTRAMUSCULAR | Status: DC | PRN

## 2013-08-18 MED ORDER — DEXTROSE 5 % IV SOLN
INTRAVENOUS | Status: AC
  Filled 2013-08-18: qty 1

## 2013-08-18 MED ORDER — PNEUMOCOCCAL VAC POLYVALENT 25 MCG/0.5ML IJ INJ
0.5000 mL | INJECTION | INTRAMUSCULAR | Status: AC
  Administered 2013-08-19: 0.5 mL via INTRAMUSCULAR
  Filled 2013-08-18 (×2): qty 0.5

## 2013-08-18 MED ORDER — ONDANSETRON HCL 4 MG/2ML IJ SOLN
4.0000 mg | Freq: Once | INTRAMUSCULAR | Status: AC
  Administered 2013-08-18: 4 mg via INTRAVENOUS
  Filled 2013-08-18: qty 2

## 2013-08-18 MED ORDER — SODIUM CHLORIDE 0.9 % IJ SOLN
10.0000 mL | Freq: Two times a day (BID) | INTRAMUSCULAR | Status: DC
  Administered 2013-08-18 – 2013-08-19 (×2): 10 mL
  Administered 2013-08-19: 20 mL
  Administered 2013-08-20: 10 mL
  Administered 2013-08-20: 40 mL
  Administered 2013-08-21 – 2013-08-24 (×5): 10 mL
  Administered 2013-08-24 – 2013-08-27 (×6): 30 mL
  Administered 2013-08-28: 10 mL

## 2013-08-18 MED ORDER — SODIUM CHLORIDE 0.9 % IV SOLN
INTRAVENOUS | Status: DC
  Administered 2013-08-18 (×2): via INTRAVENOUS

## 2013-08-18 MED ORDER — VANCOMYCIN HCL IN DEXTROSE 750-5 MG/150ML-% IV SOLN
INTRAVENOUS | Status: AC
  Filled 2013-08-18: qty 150

## 2013-08-18 MED ORDER — PANTOPRAZOLE SODIUM 40 MG IV SOLR
40.0000 mg | Freq: Once | INTRAVENOUS | Status: AC
  Administered 2013-08-18: 40 mg via INTRAVENOUS
  Filled 2013-08-18: qty 40

## 2013-08-18 MED ORDER — ACETAMINOPHEN 325 MG PO TABS
650.0000 mg | ORAL_TABLET | Freq: Four times a day (QID) | ORAL | Status: DC | PRN
  Administered 2013-08-20 – 2013-08-30 (×2): 650 mg via ORAL
  Filled 2013-08-18 (×2): qty 2

## 2013-08-18 MED ORDER — DEXTROSE 5 % IV SOLN
1.0000 g | INTRAVENOUS | Status: DC
  Administered 2013-08-18 – 2013-08-24 (×7): 1 g via INTRAVENOUS
  Filled 2013-08-18 (×9): qty 1

## 2013-08-18 MED ORDER — BIOTENE DRY MOUTH MT LIQD
15.0000 mL | Freq: Three times a day (TID) | OROMUCOSAL | Status: DC
  Administered 2013-08-18 – 2013-08-30 (×42): 15 mL via OROMUCOSAL

## 2013-08-18 MED ORDER — SODIUM CHLORIDE 0.9 % IV SOLN
1.0000 g | Freq: Once | INTRAVENOUS | Status: AC
  Administered 2013-08-18: 1 g via INTRAVENOUS
  Filled 2013-08-18 (×2): qty 10

## 2013-08-18 MED ORDER — SODIUM CHLORIDE 0.9 % IV SOLN: INTRAVENOUS | Status: AC

## 2013-08-18 MED ORDER — ACETAMINOPHEN 650 MG RE SUPP: 650.0000 mg | Freq: Four times a day (QID) | RECTAL | Status: DC | PRN

## 2013-08-18 MED ORDER — VANCOMYCIN HCL IN DEXTROSE 750-5 MG/150ML-% IV SOLN
750.0000 mg | INTRAVENOUS | Status: DC
  Administered 2013-08-18 – 2013-08-20 (×3): 750 mg via INTRAVENOUS
  Filled 2013-08-18 (×5): qty 150

## 2013-08-18 MED ORDER — PANTOPRAZOLE SODIUM 40 MG IV SOLR
40.0000 mg | Freq: Two times a day (BID) | INTRAVENOUS | Status: DC
Start: 2013-08-18 — End: 2013-08-22
  Administered 2013-08-18 – 2013-08-21 (×7): 40 mg via INTRAVENOUS
  Filled 2013-08-18 (×8): qty 40

## 2013-08-18 MED ORDER — SODIUM CHLORIDE 0.9 % IV BOLUS (SEPSIS)
1000.0000 mL | Freq: Once | INTRAVENOUS | Status: AC
  Administered 2013-08-18: 1000 mL via INTRAVENOUS

## 2013-08-18 NOTE — Procedures (Signed)
Central Venous Catheter Insertion Procedure Note [name] [id number] 09/30/1925  Procedure: Insertion of Central Venous Catheter Indications: Assessment of intravascular volume, Drug and/or fluid administration and Frequent blood sampling  Procedure Details Consent: Risks of procedure as well as the alternatives and risks of each were explained to the (patient/caregiver).  Consent for procedure obtained. Time Out: Verified patient identification, verified procedure, site/side was marked, verified correct patient position, special equipment/implants available, medications/allergies/relevent history reviewed, required imaging and test results available.  Performed  Maximum sterile technique was used including antiseptics, cap, gloves, gown, hand hygiene, mask and sheet. Skin prep: Chlorhexidine; local anesthetic administered A antimicrobial bonded/coated triple lumen catheter was placed in the right femoral vein due to multiple attempts, no other available access using the Seldinger technique.  Evaluation Blood flow good Complications: No apparent complications Patient did tolerate procedure well.  [name] A 08/18/2013, 9:21 PM

## 2013-08-18 NOTE — ED Notes (Signed)
Occult blood positive 

## 2013-08-18 NOTE — ED Notes (Signed)
Patient arrives via EMS from [place]. About a week ago, patient vomitted coffee ground emesis, but patient refused to go to hospital. Patient was seen by a physician on August 09, 2013, and he advised that patient go to hospital, but she refused. Yesterday, a nurse noticed that patient had spit up a small amount of bright red blood. Patient denies any abdominal pain. Patient has large skin tear wound on left calf that is healing.

## 2013-08-18 NOTE — H&P (Signed)
[facility] History and Physical  [name] [id number] DOB: 1926/03/31 DOA: 08/18/2013  Referring physician:  PCP: [name], NP  Specialists:   Chief Complaint: Hematemesis  HPI: [name] is a 80 y.o. female  With a history of dementia, failure to thrive, paroxysmal atrial fibrillation presents emergency department for hematemesis. Patient with currently resides at [facility] home. She had come to emergency department approximately one week ago with similar complaints of hematemesis. Patient is currently accompanied by [name] son who is given most of this history and physical. He does note that patient had coffee ground emesis as well. Yesterday while at the nursing home, patient was seen by a nurse and noticed bright red blood. At this time no further information could be given. The son does state that the nursing home physician was going to call the gastroenterology.  Review of Systems:  Unobtainable from patient due to [name] current state.  Past Medical History  Diagnosis Date  . Hypothyroidism   . Atrial fibrillation   . Myositis     BODY  . LVH (left ventricular hypertrophy)     MILD  . Dizziness   . Headache(784.0)   . Weakness   . Chest pain   . DOE (dyspnea on exertion)   . Poor appetite   . UTI (urinary tract infection)   . Dysphagia     History of dysphagia 2, depressed by esophagus status post dilation  . Arthritis   . Hypokalemia   . History of anxiety   . Orthostatic hypotension   . Inclusion body myositis   . Easy bruising   . Complication of anesthesia     anxiety before anesthesia, wants spinal  . Myalgia and myositis, unspecified 04/30/2011  . Malnutrition/Cachexia 05/05/2012    Pre-morbid body weight 150 lbs, 04/2012 <100 lbs   . Failure to thrive 05/05/2012  . Macrocytosis 05/05/2012  . Diarrhea 05/05/2012  . Anxiety    Past Surgical History  Procedure Laterality Date  . Total hip arthroplasty  2000 or 2001    right  .  Transthoracic echocardiogram  07/08/10    EF 55-60%  . Left total hip arthroplasty  1996  . Total hip revision  12/18/2011    Procedure: TOTAL HIP REVISION;  Surgeon: Gearlean Alf, MD;  Location: [facility];  Service: Orthopedics;  Laterality: Right;  Right Hip Revision to Constrained Liner-acetabular   . Flexible sigmoidoscopy  05/07/2012    Procedure: FLEXIBLE SIGMOIDOSCOPY;  Surgeon: Rogene Houston, MD;  Location: [facility];  Service: Endoscopy;  Laterality: N/A;  . Partial hysterectomy N/A     distant history, type unknown   Social History:  reports that she has never smoked. She has never used smokeless tobacco. She reports that she does not drink alcohol or use illicit drugs. Currently bedbound. Resides at a nursing home.  Allergies  Allergen Reactions  . Codeine Other (See Comments)    unknown  . Morphine And Related Other (See Comments)    unknown  . Piroxicam Other (See Comments)    unknown    No family history on file.   Prior to Admission medications   Medication Sig Start Date End Date Taking? Authorizing Provider  acetaminophen (TYLENOL) 500 MG tablet Take 500 mg by mouth 3 (three) times daily.   Yes Historical Provider, MD  albuterol (PROVENTIL HFA;VENTOLIN HFA) 108 (90 BASE) MCG/ACT inhaler Inhale 2 puffs into the lungs every 6 (six) hours as needed. Shortness of breath  Yes Historical Provider, MD  ALPRAZolam Duanne Moron) 0.5 MG tablet Take 1 tablet (0.5 mg total) by mouth 2 (two) times daily as needed for sleep or anxiety. 12/04/12  Yes Geradine Girt, DO  alum & mag hydroxide-simeth (MAALOX PLUS) 400-400-40 MG/5ML suspension Take 5 mLs by mouth as needed for indigestion.    Yes Historical Provider, MD  azaTHIOprine (IMURAN) 50 MG tablet Take 100 mg by mouth daily.   Yes Historical Provider, MD  benzonatate (TESSALON) 100 MG capsule Take 100 mg by mouth every 8 (eight) hours as needed for cough.   Yes Historical Provider, MD  docusate sodium (COLACE) 100 MG capsule Take  100 mg by mouth daily.   Yes Historical Provider, MD  escitalopram (LEXAPRO) 20 MG tablet Take 20 mg by mouth daily.   Yes Historical Provider, MD  feeding supplement (PRO-STAT SUGAR FREE 64) LIQD Take 30 mLs by mouth 2 (two) times daily. 05/10/12  Yes Rexene Alberts, MD  folic acid (FOLVITE) 1 MG tablet Take 1 tablet (1 mg total) by mouth daily. 05/10/12  Yes Rexene Alberts, MD  guaiFENesin (MUCINEX) 600 MG 12 hr tablet Take 600 mg by mouth 2 (two) times daily.   Yes Historical Provider, MD  guaifenesin (ROBITUSSIN) 100 MG/5ML syrup Take 200 mg by mouth every 6 (six) hours as needed for cough or congestion.    Yes Historical Provider, MD  HYDROcodone-acetaminophen (NORCO/VICODIN) 5-325 MG per tablet Take 1 tablet by mouth every 6 (six) hours as needed for moderate pain.   Yes Historical Provider, MD  ipratropium-albuterol (DUONEB) 0.5-2.5 (3) MG/3ML SOLN Take 3 mLs by nebulization 3 (three) times daily.   Yes Historical Provider, MD  levothyroxine (SYNTHROID, LEVOTHROID) 112 MCG tablet Take 112 mcg by mouth daily before breakfast.   Yes Historical Provider, MD  loperamide (IMODIUM) 2 MG capsule Take 1 capsule (2 mg total) by mouth as needed for diarrhea or loose stools (Give after each loose BM, not to exceed eight capsules (16mg ) in 24hr). 05/10/12  Yes Rexene Alberts, MD  loratadine (CLARITIN) 10 MG tablet Take 10 mg by mouth daily.   Yes Historical Provider, MD  metoprolol tartrate (LOPRESSOR) 25 MG tablet Take 12.5 mg by mouth every morning. 02/10/12  Yes [name], MD  mirtazapine (REMERON) 30 MG tablet Take 30 mg by mouth at bedtime.   Yes Historical Provider, MD  Multiple Vitamin (MULTIVITAMIN WITH MINERALS) TABS Take 1 tablet by mouth daily. 05/10/12  Yes Rexene Alberts, MD  nitrofurantoin, macrocrystal-monohydrate, (MACROBID) 100 MG capsule Take 100 mg by mouth 2 (two) times daily. For 14 days. Starting 08/11/13   Yes Historical Provider, MD  omeprazole (PRILOSEC) 40 MG capsule Take 40 mg by  mouth 2 (two) times daily.   Yes Historical Provider, MD  ondansetron (ZOFRAN) 4 MG tablet Take 1 tablet (4 mg total) by mouth every 8 (eight) hours as needed for nausea. 07/28/12  Yes Rogene Houston, MD  oxybutynin (DITROPAN) 5 MG tablet Take 1 tablet (5 mg total) by mouth 4 (four) times daily. 12/04/12  Yes Geradine Girt, DO  polyethylene glycol (MIRALAX / GLYCOLAX) packet Take 17 g by mouth daily as needed for mild constipation.   Yes Historical Provider, MD  potassium chloride SA (K-DUR,KLOR-CON) 20 MEQ tablet Take 1 tablet (20 mEq total) by mouth daily. 12/05/12  Yes Geradine Girt, DO  predniSONE (DELTASONE) 10 MG tablet Take 1 tablet (10 mg total) by mouth daily with breakfast. 05/10/12  Yes Rexene Alberts, MD  sennosides-docusate  sodium (SENOKOT-S) 8.6-50 MG tablet Take 1 tablet by mouth 2 (two) times daily.   Yes Historical Provider, MD  tamsulosin (FLOMAX) 0.4 MG CAPS capsule Take 0.4 mg by mouth at bedtime.   Yes Historical Provider, MD   Physical Exam: Filed Vitals:   08/18/13 1627  BP: 71/48  Pulse:   Temp:   Resp: 20     General: Well developed, malnourished, appears stated age  HEENT: NCAT, PERRLA, EOMI, Anicteic Sclera, mucous membranes dry. No pharyngeal erythema or exudates  Neck: Supple, no JVD, no masses  Cardiovascular: S1 S2 auscultated, irregularly irregular, tachycardic  Respiratory: Coarse breath sounds noted bilaterally.  Abdomen: Soft, diffusely tender, nondistended, + bowel sounds  Extremities: warm dry without cyanosis clubbing or edema  Neuro: Alert and awake.  Unable to fully assess due to [name] current state.   Skin: Without rashes exudates or nodules  Psych: Unable to assess  Labs on Admission:  Basic Metabolic Panel:  Recent Labs Lab 08/18/13 1346  NA 135*  K 5.5*  CL 103  CO2 19  GLUCOSE 110*  BUN 57*  CREATININE 1.52*  CALCIUM 8.8   Liver Function Tests: No results found for this basename: AST, ALT, ALKPHOS, BILITOT, PROT, ALBUMIN,   in the last 168 hours No results found for this basename: LIPASE, AMYLASE,  in the last 168 hours No results found for this basename: AMMONIA,  in the last 168 hours CBC:  Recent Labs Lab 08/18/13 1346  WBC 16.9*  HGB 9.5*  HCT 29.5*  MCV 103.1*  PLT 158   Cardiac Enzymes: No results found for this basename: CKTOTAL, CKMB, CKMBINDEX, TROPONINI,  in the last 168 hours  BNP (last 3 results) No results found for this basename: PROBNP,  in the last 8760 hours CBG: No results found for this basename: GLUCAP,  in the last 168 hours  Radiological Exams on Admission: No results found.  EKG: Independently reviewed. Atrial fibrillation, rate 82, no specific T wave changes  Assessment/Plan  Possible Septic Shock Patient will be admitted to the ICU. Will order chest x-ray as well as a urinalysis and culture as patient has leukocytosis. Will also obtain consent for possible central line placement by general surgery for the use of pressors. Will continue IV rehydration and resuscitation. Will place on broad-spectrum antibiotics of vancomycin and cefepime. Currently patient's last blood pressure was noted to be 63/30 with a heart rate of 126.    Hematemesis/GI Bleed Patient be made n.p.o. Will order 1 unit of packed red blood cells to be given. Will consult GI for further intervention and management. Place patient on Protonix 40 mg IV twice a day. Patient did have a positive occult.  Anemia secondary to acute blood loss Will continue to monitor [name] H/H every 8 hours.  Will order one unit PRBCs.    Acute kidney injury with hyperkalemia Likely secondary to sepsis versus hypoperfusion. Nephrotoxic medications. Will continue to trend [name] creatinine. She does appear to have a baseline of less than 1.  Will give calcium gluconate for hyperkalemia. Will not give Kayexalate at this time due to possible worsening of [name] hypotension and volume depletion.  A. fib RVR Likely secondary to [name] septic  shock and volume depletion. Will continue to monitor. We'll not give any beta blockers at this time due to [name] hypotension. Will obtain a TSH level.  History of dementia Stable   DVT prophylaxis: SCDs  Code Status: DNR, except meds  Condition: Critical  Family Communication: Family at  bedside. Admission, patients condition and plan of care including tests being ordered have been discussed with the family who indicate understanding and agree with the plan and Code Status.  Disposition Plan: Admitted   Critical Care time spent: 75 minutes  Myleka Moncure D.O. [facility] Pager [phone]  If 7PM-7AM, please contact night-coverage [email or web address] Password [facility] 08/18/2013, 4:46 PM

## 2013-08-18 NOTE — ED Provider Notes (Addendum)
CSN: [id number]     Arrival date & time 08/18/13  1239 History   This chart was scribed for Nat Christen, MD by Era Bumpers, ED scribe. This patient was seen in room APA02/APA02 and the patient's care was started at 1239.  Chief Complaint  Patient presents with  . Hematemesis   The history is provided by the patient. The history is limited by the condition of the patient. No language interpreter was used.  Level 5 Caveat to pt's mental status HPI Comments: KAMIKO RAZAVI is a 80 y.o. female brought in by ambulance, who presents to the Emergency Department complaining of hematemesis episode that occurred one week ago and occasionally coughs up some blood. Reports that in her hematemesis episode that her vomitus consisted of coffee grounds but at that time refused to go to the hospital. She was seen by a provider 9 days ago and advised for her to go to the hospital but she again refused. Yesterday, her nurse noticed she had coughed up some bright red blood. She presents here today w/her son who states that she seems baseline to him aside from occasionally coughing up blood. Baseline bed ridden and cannot ambulate.   Past Medical History  Diagnosis Date  . Hypothyroidism   . Atrial fibrillation   . Myositis     BODY  . LVH (left ventricular hypertrophy)     MILD  . Dizziness   . Headache(784.0)   . Weakness   . Chest pain   . DOE (dyspnea on exertion)   . Poor appetite   . UTI (urinary tract infection)   . Dysphagia     History of dysphagia 2, depressed by esophagus status post dilation  . Arthritis   . Hypokalemia   . History of anxiety   . Orthostatic hypotension   . Inclusion body myositis   . Easy bruising   . Complication of anesthesia     anxiety before anesthesia, wants spinal  . Myalgia and myositis, unspecified 04/30/2011  . Malnutrition/Cachexia 05/05/2012    Pre-morbid body weight 150 lbs, 04/2012 <100 lbs   . Failure to thrive 05/05/2012  . Macrocytosis 05/05/2012  .  Diarrhea 05/05/2012  . Anxiety    Past Surgical History  Procedure Laterality Date  . Total hip arthroplasty  2000 or 2001    right  . Transthoracic echocardiogram  07/08/10    EF 55-60%  . Left total hip arthroplasty  1996  . Total hip revision  12/18/2011    Procedure: TOTAL HIP REVISION;  Surgeon: Gearlean Alf, MD;  Location: [facility];  Service: Orthopedics;  Laterality: Right;  Right Hip Revision to Constrained Liner-acetabular   . Flexible sigmoidoscopy  05/07/2012    Procedure: FLEXIBLE SIGMOIDOSCOPY;  Surgeon: Rogene Houston, MD;  Location: [facility];  Service: Endoscopy;  Laterality: N/A;  . Partial hysterectomy N/A     distant history, type unknown   No family history on file. History  Substance Use Topics  . Smoking status: Never Smoker   . Smokeless tobacco: Never Used  . Alcohol Use: No   OB History   Grav Para Term Preterm Abortions TAB SAB Ect Mult Living                 Review of Systems  Unable to perform ROS Constitutional: Negative for fever and chills.  Respiratory: Negative for cough and shortness of breath.   Cardiovascular: Negative for chest pain.  Gastrointestinal: Positive for vomiting. Negative for abdominal  pain.  Musculoskeletal: Negative for back pain.  All other systems reviewed and are negative.    Allergies  Codeine; Morphine and related; and Piroxicam  Home Medications   Current Outpatient Rx  Name  Route  Sig  Dispense  Refill  . acetaminophen (TYLENOL) 500 MG tablet   Oral   Take 500 mg by mouth 3 (three) times daily.         Marland Kitchen albuterol (PROVENTIL HFA;VENTOLIN HFA) 108 (90 BASE) MCG/ACT inhaler   Inhalation   Inhale 2 puffs into the lungs every 6 (six) hours as needed. Shortness of breath         . ALPRAZolam (XANAX) 0.5 MG tablet   Oral   Take 1 tablet (0.5 mg total) by mouth 2 (two) times daily as needed for sleep or anxiety.      0   . alum & mag hydroxide-simeth (MAALOX PLUS) 400-400-40 MG/5ML suspension    Oral   Take 5 mLs by mouth as needed for indigestion.          Marland Kitchen azaTHIOprine (IMURAN) 50 MG tablet   Oral   Take 100 mg by mouth daily.         . benzonatate (TESSALON) 100 MG capsule   Oral   Take 100 mg by mouth every 8 (eight) hours as needed for cough.         . docusate sodium (COLACE) 100 MG capsule   Oral   Take 100 mg by mouth daily.         Marland Kitchen escitalopram (LEXAPRO) 20 MG tablet   Oral   Take 20 mg by mouth daily.         . feeding supplement (PRO-STAT SUGAR FREE 64) LIQD   Oral   Take 30 mLs by mouth 2 (two) times daily.   409 mL      . folic acid (FOLVITE) 1 MG tablet   Oral   Take 1 tablet (1 mg total) by mouth daily.         Marland Kitchen guaiFENesin (MUCINEX) 600 MG 12 hr tablet   Oral   Take 600 mg by mouth 2 (two) times daily.         Marland Kitchen guaifenesin (ROBITUSSIN) 100 MG/5ML syrup   Oral   Take 200 mg by mouth every 6 (six) hours as needed for cough or congestion.          Marland Kitchen HYDROcodone-acetaminophen (NORCO/VICODIN) 5-325 MG per tablet   Oral   Take 1 tablet by mouth every 6 (six) hours as needed for moderate pain.         Marland Kitchen ipratropium-albuterol (DUONEB) 0.5-2.5 (3) MG/3ML SOLN   Nebulization   Take 3 mLs by nebulization 3 (three) times daily.         Marland Kitchen levothyroxine (SYNTHROID, LEVOTHROID) 112 MCG tablet   Oral   Take 112 mcg by mouth daily before breakfast.         . loperamide (IMODIUM) 2 MG capsule   Oral   Take 1 capsule (2 mg total) by mouth as needed for diarrhea or loose stools (Give after each loose BM, not to exceed eight capsules (16mg ) in 24hr).   30 capsule      . loratadine (CLARITIN) 10 MG tablet   Oral   Take 10 mg by mouth daily.         . metoprolol tartrate (LOPRESSOR) 25 MG tablet   Oral   Take 12.5 mg by mouth every morning.         Marland Kitchen  mirtazapine (REMERON) 30 MG tablet   Oral   Take 30 mg by mouth at bedtime.         . Multiple Vitamin (MULTIVITAMIN WITH MINERALS) TABS   Oral   Take 1 tablet by  mouth daily.         . nitrofurantoin, macrocrystal-monohydrate, (MACROBID) 100 MG capsule   Oral   Take 100 mg by mouth 2 (two) times daily. For 14 days. Starting 08/11/13         . omeprazole (PRILOSEC) 40 MG capsule   Oral   Take 40 mg by mouth 2 (two) times daily.         . ondansetron (ZOFRAN) 4 MG tablet   Oral   Take 1 tablet (4 mg total) by mouth every 8 (eight) hours as needed for nausea.   30 tablet   1   . oxybutynin (DITROPAN) 5 MG tablet   Oral   Take 1 tablet (5 mg total) by mouth 4 (four) times daily.         . polyethylene glycol (MIRALAX / GLYCOLAX) packet   Oral   Take 17 g by mouth daily as needed for mild constipation.         . potassium chloride SA (K-DUR,KLOR-CON) 20 MEQ tablet   Oral   Take 1 tablet (20 mEq total) by mouth daily.         . predniSONE (DELTASONE) 10 MG tablet   Oral   Take 1 tablet (10 mg total) by mouth daily with breakfast.         . sennosides-docusate sodium (SENOKOT-S) 8.6-50 MG tablet   Oral   Take 1 tablet by mouth 2 (two) times daily.         . tamsulosin (FLOMAX) 0.4 MG CAPS capsule   Oral   Take 0.4 mg by mouth at bedtime.          Triage Vitals: BP 77/47  Pulse 123  Temp(Src) 98.1 F (36.7 C) (Oral)  Resp 20  Ht 5\' 4"  (1.626 m)  Wt 143 lb (64.864 kg)  BMI 24.53 kg/m2  SpO2 95%  Physical Exam  Nursing note and vitals reviewed. Constitutional: She is oriented to person, place, and time. She appears well-developed and well-nourished.  HENT:  Head: Normocephalic and atraumatic.  Eyes: Conjunctivae and EOM are normal. Pupils are equal, round, and reactive to light.  Neck: Normal range of motion. Neck supple.  Cardiovascular: Normal rate, regular rhythm and normal heart sounds.   Pulmonary/Chest: Effort normal and breath sounds normal.  Abdominal: Soft. Bowel sounds are normal. She exhibits no distension. There is no tenderness.  Musculoskeletal: Normal range of motion.  Unable  Neurological:  She is alert and oriented to person, place, and time.  Demented  dementia  Skin: Skin is warm and dry.  Psychiatric: She has a normal mood and affect. Her behavior is normal.  dementia   ED Course  Procedures (including critical care time) DIAGNOSTIC STUDIES: Oxygen Saturation is 95% on room air, adequate by my interpretation.    COORDINATION OF CARE: At 130 PM Discussed treatment plan with patient which includes blood work, IV fluids, EKG, protonix, nausea medicine. Patient agrees.   Labs Review Labs Reviewed  CBC - Abnormal; Notable for the following:    WBC 16.9 (*)    RBC 2.86 (*)    Hemoglobin 9.5 (*)    HCT 29.5 (*)    MCV 103.1 (*)    RDW 15.6 (*)  All other components within normal limits  BASIC METABOLIC PANEL - Abnormal; Notable for the following:    Sodium 135 (*)    Potassium 5.5 (*)    Glucose, Bld 110 (*)    BUN 57 (*)    Creatinine, Ser 1.52 (*)    GFR calc non Af Amer 30 (*)    GFR calc Af Amer 35 (*)    All other components within normal limits  OCCULT BLOOD, POC DEVICE - Abnormal; Notable for the following:    Fecal Occult Bld POSITIVE (*)    All other components within normal limits  URINALYSIS, ROUTINE W REFLEX MICROSCOPIC  TYPE AND SCREEN   Imaging Review No results found.  EKG Interpretation    Date/Time:  Wednesday August 18 2013 12:47:43 EST Ventricular Rate:  120 PR Interval:    QRS Duration: 86 QT Interval:  284 QTC Calculation: 401 R Axis:   -41 Text Interpretation:  Atrial fibrillation with rapid ventricular response Left axis deviation Abnormal ECG When compared with ECG of 29-Nov-2012 16:09, ST no longer depressed in Inferior leads Non-specific change in ST segment in Lateral leads Confirmed by Carry Ortez  MD, Kionna Brier ([id number]) on 08/18/2013 3:13:11 PM          CRITICAL CARE Performed by: Nat Christen  ?  Total critical care time: 30  Critical care time was exclusive of separately billable procedures and treating other  patients.  Critical care was necessary to treat or prevent imminent or life-threatening deterioration.  Critical care was time spent personally by me on the following activities: development of treatment plan with patient and/or surrogate as well as nursing, discussions with consultants, evaluation of patient's response to treatment, examination of patient, obtaining history from patient or surrogate, ordering and performing treatments and interventions, ordering and review of laboratory studies, ordering and review of radiographic studies, pulse oximetry and re-evaluation of patient's condition. MDM   Final diagnoses:  Upper GI bleed   Son reports upper GI bleed with coffee-ground emesis and some red blood. Patient is hypotensive and tachycardic. Rectal exam positive for blood. IV hydration, IV Protonix. Discussed with son. I attempted to discuss with son CODE STATUS. I did not feel the patient would survive a code.  Admit to general medicine.   I personally performed the services described in this documentation, which was scribed in my presence. The recorded information has been reviewed and is accurate.       Nat Christen, MD 08/18/13 [place], MD 08/21/13 [phone]

## 2013-08-18 NOTE — Progress Notes (Signed)
ANTIBIOTIC CONSULT NOTE - INITIAL  Pharmacy Consult for Cefepime & Vanc Indication: rule out sepsis  Allergies  Allergen Reactions  . Codeine Other (See Comments)    unknown  . Morphine And Related Other (See Comments)    unknown  . Piroxicam Other (See Comments)    unknown    Patient Measurements: Height: 5\' 4"  (162.6 cm) Weight: 143 lb (64.864 kg) IBW/kg (Calculated) : 54.7  Vital Signs: Temp: 98.1 F (36.7 C) (02/11 1250) Temp src: Oral (02/11 1250) BP: 68/54 mmHg (02/11 1630) Pulse Rate: 104 (02/11 1430) Intake/Output from previous day:   Intake/Output from this shift:    Labs:  Recent Labs  08/18/13 1346  WBC 16.9*  HGB 9.5*  PLT 158  CREATININE 1.52*   Estimated Creatinine Clearance: 23.4 ml/min (by C-G formula based on Cr of 1.52). No results found for this basename: VANCOTROUGH, VANCOPEAK, VANCORANDOM, GENTTROUGH, GENTPEAK, GENTRANDOM, TOBRATROUGH, TOBRAPEAK, TOBRARND, AMIKACINPEAK, AMIKACINTROU, AMIKACIN,  in the last 72 hours   Microbiology: No results found for this or any previous visit (from the past 720 hour(s)).  Medical History: Past Medical History  Diagnosis Date  . Hypothyroidism   . Atrial fibrillation   . Myositis     BODY  . LVH (left ventricular hypertrophy)     MILD  . Dizziness   . Headache(784.0)   . Weakness   . Chest pain   . DOE (dyspnea on exertion)   . Poor appetite   . UTI (urinary tract infection)   . Dysphagia     History of dysphagia 2, depressed by esophagus status post dilation  . Arthritis   . Hypokalemia   . History of anxiety   . Orthostatic hypotension   . Inclusion body myositis   . Easy bruising   . Complication of anesthesia     anxiety before anesthesia, wants spinal  . Myalgia and myositis, unspecified 04/30/2011  . Malnutrition/Cachexia 05/05/2012    Pre-morbid body weight 150 lbs, 04/2012 <100 lbs   . Failure to thrive 05/05/2012  . Macrocytosis 05/05/2012  . Diarrhea 05/05/2012  . Anxiety      Medications:  Scheduled:    Assessment: 80 yo F presents from NH with hematemesis.  She is starting on broad-spectrum antibiotics for possible sepsis.  She is tachycardic with leukocytosis & acute renal failure.   Cx data pending.  Vancomycin 2/11>> Cefepime 2/11>>  Goal of Therapy:  Vancomycin trough level 15-20 mcg/ml  Plan:  Cefepime 1gm IV q24h Vancomycin 750mg  IV q24h Check Vancomycin trough at steady state Monitor renal function and cx data   Biagio Borg 08/18/2013,5:23 PM

## 2013-08-18 NOTE — ED Notes (Signed)
Patient is current on macrobid for UTI

## 2013-08-19 ENCOUNTER — Encounter (HOSPITAL_COMMUNITY): Payer: Self-pay | Admitting: Adult Health

## 2013-08-19 ENCOUNTER — Inpatient Hospital Stay (HOSPITAL_COMMUNITY): Payer: Medicare Other

## 2013-08-19 DIAGNOSIS — K922 Gastrointestinal hemorrhage, unspecified: Secondary | ICD-10-CM

## 2013-08-19 DIAGNOSIS — R6520 Severe sepsis without septic shock: Secondary | ICD-10-CM

## 2013-08-19 DIAGNOSIS — D62 Acute posthemorrhagic anemia: Secondary | ICD-10-CM

## 2013-08-19 DIAGNOSIS — E46 Unspecified protein-calorie malnutrition: Secondary | ICD-10-CM

## 2013-08-19 DIAGNOSIS — A419 Sepsis, unspecified organism: Secondary | ICD-10-CM

## 2013-08-19 DIAGNOSIS — R6521 Severe sepsis with septic shock: Secondary | ICD-10-CM

## 2013-08-19 DIAGNOSIS — K920 Hematemesis: Secondary | ICD-10-CM

## 2013-08-19 DIAGNOSIS — I4891 Unspecified atrial fibrillation: Secondary | ICD-10-CM

## 2013-08-19 LAB — BASIC METABOLIC PANEL
BUN: 45 mg/dL — ABNORMAL HIGH (ref 6–23)
CALCIUM: 8.1 mg/dL — AB (ref 8.4–10.5)
CHLORIDE: 113 meq/L — AB (ref 96–112)
CO2: 15 meq/L — AB (ref 19–32)
Creatinine, Ser: 1.11 mg/dL — ABNORMAL HIGH (ref 0.50–1.10)
GFR calc Af Amer: 51 mL/min — ABNORMAL LOW (ref >90)
GFR calc non Af Amer: 44 mL/min — ABNORMAL LOW (ref >90)
Glucose, Bld: 83 mg/dL (ref 70–99)
Potassium: 5 mEq/L (ref 3.7–5.3)
SODIUM: 142 meq/L (ref 137–147)

## 2013-08-19 LAB — TYPE AND SCREEN
ABO/RH(D): O POS
ANTIBODY SCREEN: NEGATIVE
UNIT DIVISION: 0

## 2013-08-19 LAB — HEMOGLOBIN AND HEMATOCRIT, BLOOD
HEMATOCRIT: 27.4 % — AB (ref 36.0–46.0)
HEMOGLOBIN: 9.1 g/dL — AB (ref 12.0–15.0)

## 2013-08-19 LAB — CBC
HCT: 30.4 % — ABNORMAL LOW (ref 36.0–46.0)
HEMOGLOBIN: 10.1 g/dL — AB (ref 12.0–15.0)
MCH: 33.6 pg (ref 26.0–34.0)
MCHC: 33.2 g/dL (ref 30.0–36.0)
MCV: 101 fL — AB (ref 78.0–100.0)
PLATELETS: 138 10*3/uL — AB (ref 150–400)
RBC: 3.01 MIL/uL — AB (ref 3.87–5.11)
RDW: 17.4 % — ABNORMAL HIGH (ref 11.5–15.5)
WBC: 16.2 10*3/uL — AB (ref 4.0–10.5)

## 2013-08-19 LAB — TSH: TSH: 1.185 u[IU]/mL (ref 0.350–4.500)

## 2013-08-19 MED ORDER — DIGOXIN 0.25 MG/ML IJ SOLN
0.2500 mg | Freq: Once | INTRAMUSCULAR | Status: AC
  Administered 2013-08-20: 0.25 mg via INTRAVENOUS
  Filled 2013-08-19: qty 2

## 2013-08-19 MED ORDER — LEVALBUTEROL HCL 0.63 MG/3ML IN NEBU: 0.6300 mg | INHALATION_SOLUTION | Freq: Four times a day (QID) | RESPIRATORY_TRACT | Status: DC | PRN

## 2013-08-19 MED ORDER — SODIUM CHLORIDE 0.9 % IV BOLUS (SEPSIS)
500.0000 mL | Freq: Once | INTRAVENOUS | Status: AC
  Administered 2013-08-19: 500 mL via INTRAVENOUS

## 2013-08-19 MED ORDER — DICLOFENAC SODIUM 1 % TD GEL
2.0000 g | Freq: Two times a day (BID) | TRANSDERMAL | Status: DC
  Administered 2013-08-19 – 2013-08-30 (×23): 2 g via TOPICAL
  Filled 2013-08-19: qty 100

## 2013-08-19 MED ORDER — LEVALBUTEROL HCL 1.25 MG/0.5ML IN NEBU
1.2500 mg | INHALATION_SOLUTION | Freq: Once | RESPIRATORY_TRACT | Status: AC
  Administered 2013-08-19: 1.25 mg via RESPIRATORY_TRACT
  Filled 2013-08-19: qty 0.5

## 2013-08-19 MED ORDER — DIGOXIN 0.25 MG/ML IJ SOLN
0.2500 mg | Freq: Four times a day (QID) | INTRAMUSCULAR | Status: AC
  Administered 2013-08-19 (×2): 0.25 mg via INTRAVENOUS
  Filled 2013-08-19 (×2): qty 2

## 2013-08-19 MED ORDER — LEVALBUTEROL HCL 0.63 MG/3ML IN NEBU
0.6300 mg | INHALATION_SOLUTION | Freq: Three times a day (TID) | RESPIRATORY_TRACT | Status: DC
  Administered 2013-08-20 – 2013-08-30 (×30): 0.63 mg via RESPIRATORY_TRACT
  Filled 2013-08-19 (×31): qty 3

## 2013-08-19 MED ORDER — SODIUM CHLORIDE 0.9 % IV BOLUS (SEPSIS)
250.0000 mL | Freq: Once | INTRAVENOUS | Status: AC
  Administered 2013-08-19: 250 mL via INTRAVENOUS

## 2013-08-19 MED ORDER — LORAZEPAM 2 MG/ML IJ SOLN
1.0000 mg | Freq: Once | INTRAMUSCULAR | Status: AC
  Administered 2013-08-19: 1 mg via INTRAVENOUS
  Filled 2013-08-19: qty 1

## 2013-08-19 MED ORDER — DIGOXIN 0.25 MG/ML IJ SOLN
0.5000 mg | Freq: Once | INTRAMUSCULAR | Status: AC
  Administered 2013-08-19: 0.5 mg via INTRAVENOUS
  Filled 2013-08-19: qty 2

## 2013-08-19 MED ORDER — SODIUM CHLORIDE 0.9 % IV BOLUS (SEPSIS): 250.0000 mL | Freq: Once | INTRAVENOUS | Status: DC

## 2013-08-19 NOTE — Progress Notes (Signed)
Pt in A-fib with HR around 145. E-link notified around 2230. Order given for CVP monitoring, and instructed to inform hospitalist coverage.  Mid-level notified of patient's condition and instructed to page with CVP measurement. Pt's CVP was 5, and order for 258mL bolus given. The plan was to increase her BP so cardizem could be started. After giving 296mL bolus, pt's CVP increased to 6, and MAP was around 65. Orders were given for 555mL bolus, and CVP was increased at 10 with MAP still around 65. Mid-level decided to wait to treat A-fib until BP improved. Will continue to monitor.

## 2013-08-19 NOTE — Progress Notes (Signed)
Per ultrasound tech, thoracentesis was not performed as there was too little fluid to remove.

## 2013-08-19 NOTE — Clinical Social Work Note (Signed)
CSW spoke with pt's daughter regarding HCPOA. She states that there is no named HCPOA and son and daughter discuss and make decisions together.  Benay Pike, [place]

## 2013-08-19 NOTE — Consult Note (Signed)
Reason for Consult: Hematemesis Referring Physician: Hospitalist.  [name] is an 80 y.o. female.  HPI: Notes from Hospitalist. Patient is unable to provide a history.  Admitted thru the ED yesterday for hematemesis. Patient is a resident of [facility].  Apparently a week ago she was seen in the ED for same complaint.  Per notes patient had coffee ground emesis and some red blood. She was guaiac positive in the ED. Hx of atrial fib. HR today up  132-150. Hx of failure to thrive.  She has received one unit of PRBCs since admission. No BM since admission.   CBC    Component Value Date/Time   WBC 16.2* 08/19/2013 0423   RBC 3.01* 08/19/2013 0423   RBC 2.70* 05/05/2012 0504   HGB 10.1* 08/19/2013 0423   HCT 30.4* 08/19/2013 0423   PLT 138* 08/19/2013 0423   MCV 101.0* 08/19/2013 0423   MCH 33.6 08/19/2013 0423   MCHC 33.2 08/19/2013 0423   RDW 17.4* 08/19/2013 0423   LYMPHSABS 1.0 11/29/2012 2330   MONOABS 0.6 11/29/2012 2330   EOSABS 0.0 11/29/2012 2330   BASOSABS 0.0 11/29/2012 2330         Past Medical History  Diagnosis Date  . Hypothyroidism   . Atrial fibrillation   . Myositis     BODY  . LVH (left ventricular hypertrophy)     MILD  . Dizziness   . Headache(784.0)   . Weakness   . Chest pain   . DOE (dyspnea on exertion)   . Poor appetite   . UTI (urinary tract infection)   . Dysphagia     History of dysphagia 2, depressed by esophagus status post dilation  . Arthritis   . Hypokalemia   . History of anxiety   . Orthostatic hypotension   . Inclusion body myositis   . Easy bruising   . Complication of anesthesia     anxiety before anesthesia, wants spinal  . Myalgia and myositis, unspecified 04/30/2011  . Malnutrition/Cachexia 05/05/2012    Pre-morbid body weight 150 lbs, 04/2012 <100 lbs   . Failure to thrive 05/05/2012  . Macrocytosis 05/05/2012  . Diarrhea 05/05/2012  . Anxiety     Past Surgical History  Procedure Laterality Date  . Total  hip arthroplasty  2000 or 2001    right  . Transthoracic echocardiogram  07/08/10    EF 55-60%  . Left total hip arthroplasty  1996  . Total hip revision  12/18/2011    Procedure: TOTAL HIP REVISION;  Surgeon: Gearlean Alf, MD;  Location: [facility];  Service: Orthopedics;  Laterality: Right;  Right Hip Revision to Constrained Liner-acetabular   . Flexible sigmoidoscopy  05/07/2012    Procedure: FLEXIBLE SIGMOIDOSCOPY;  Surgeon: Rogene Houston, MD;  Location: [facility];  Service: Endoscopy;  Laterality: N/A;  . Partial hysterectomy N/A     distant history, type unknown    No family history on file.  Social History:  reports that she has never smoked. She has never used smokeless tobacco. She reports that she does not drink alcohol or use illicit drugs.  Allergies:  Allergies  Allergen Reactions  . Codeine Other (See Comments)    unknown  . Morphine And Related Other (See Comments)    AMS (per [name] documentation)   . Piroxicam Other (See Comments)    unknown    Medications: I have reviewed the patient's current medications.  Results for orders placed during the hospital encounter  of 08/18/13 (from the past 48 hour(s))  CBC     Status: Abnormal   Collection Time    08/18/13  1:46 PM      Result Value Ref Range   WBC 16.9 (*) 4.0 - 10.5 K/uL   RBC 2.86 (*) 3.87 - 5.11 MIL/uL   Hemoglobin 9.5 (*) 12.0 - 15.0 g/dL   HCT 29.5 (*) 36.0 - 46.0 %   MCV 103.1 (*) 78.0 - 100.0 fL   MCH 33.2  26.0 - 34.0 pg   MCHC 32.2  30.0 - 36.0 g/dL   RDW 15.6 (*) 11.5 - 15.5 %   Platelets 158  150 - 400 K/uL  BASIC METABOLIC PANEL     Status: Abnormal   Collection Time    08/18/13  1:46 PM      Result Value Ref Range   Sodium 135 (*) 137 - 147 mEq/L   Potassium 5.5 (*) 3.7 - 5.3 mEq/L   Chloride 103  96 - 112 mEq/L   CO2 19  19 - 32 mEq/L   Glucose, Bld 110 (*) 70 - 99 mg/dL   BUN 57 (*) 6 - 23 mg/dL   Creatinine, Ser 1.52 (*) 0.50 - 1.10 mg/dL   Calcium 8.8  8.4 - 10.5 mg/dL    GFR calc non Af Amer 30 (*) >90 mL/min   GFR calc Af Amer 35 (*) >90 mL/min   Comment: (NOTE)     The eGFR has been calculated using the CKD EPI equation.     This calculation has not been validated in all clinical situations.     eGFR's persistently <90 mL/min signify possible Chronic Kidney     Disease.  TSH     Status: None   Collection Time    08/18/13  1:46 PM      Result Value Ref Range   TSH 1.185  0.350 - 4.500 uIU/mL   Comment: Performed at [facility]     Status: None   Collection Time    08/18/13  1:50 PM      Result Value Ref Range   ABO/RH(D) O POS     Antibody Screen NEG     Sample Expiration 08/21/2013     Unit Number [id number]     Blood Component Type RED CELLS,LR     Unit division 00     Status of Unit ISSUED,FINAL     Transfusion Status OK TO TRANSFUSE     Crossmatch Result Compatible    PREPARE RBC (CROSSMATCH)     Status: None   Collection Time    08/18/13  1:50 PM      Result Value Ref Range   Order Confirmation ORDER PROCESSED BY BLOOD BANK    OCCULT BLOOD, POC DEVICE     Status: Abnormal   Collection Time    08/18/13  2:23 PM      Result Value Ref Range   Fecal Occult Bld POSITIVE (*) NEGATIVE  MRSA PCR SCREENING     Status: None   Collection Time    08/18/13  6:00 PM      Result Value Ref Range   MRSA by PCR NEGATIVE  NEGATIVE   Comment:            The GeneXpert MRSA Assay (FDA     approved for NASAL specimens     only), is one component of a     comprehensive MRSA colonization  surveillance program. It is not     intended to diagnose MRSA     infection nor to guide or     monitor treatment for     MRSA infections.  URINALYSIS, ROUTINE W REFLEX MICROSCOPIC     Status: Abnormal   Collection Time    08/18/13  8:51 PM      Result Value Ref Range   Color, Urine YELLOW  YELLOW   APPearance HAZY (*) CLEAR   Specific Gravity, Urine 1.020  1.005 - 1.030   pH 5.0  5.0 - 8.0   Glucose, UA NEGATIVE  NEGATIVE  mg/dL   Hgb urine dipstick MODERATE (*) NEGATIVE   Bilirubin Urine NEGATIVE  NEGATIVE   Ketones, ur NEGATIVE  NEGATIVE mg/dL   Protein, ur TRACE (*) NEGATIVE mg/dL   Urobilinogen, UA 0.2  0.0 - 1.0 mg/dL   Nitrite NEGATIVE  NEGATIVE   Leukocytes, UA SMALL (*) NEGATIVE  URINE MICROSCOPIC-ADD ON     Status: Abnormal   Collection Time    08/18/13  8:51 PM      Result Value Ref Range   Squamous Epithelial / LPF FEW (*) RARE   WBC, UA TOO NUMEROUS TO COUNT  <3 WBC/hpf   RBC / HPF 3-6  <3 RBC/hpf   Bacteria, UA FEW (*) RARE   Urine-Other FEW YEAST    HEMOGLOBIN AND HEMATOCRIT, BLOOD     Status: Abnormal   Collection Time    08/18/13 11:03 PM      Result Value Ref Range   Hemoglobin 10.6 (*) 12.0 - 15.0 g/dL   HCT 32.5 (*) 36.0 - 94.7 %  BASIC METABOLIC PANEL     Status: Abnormal   Collection Time    08/19/13  4:23 AM      Result Value Ref Range   Sodium 142  137 - 147 mEq/L   Comment: DELTA CHECK NOTED   Potassium 5.0  3.7 - 5.3 mEq/L   Chloride 113 (*) 96 - 112 mEq/L   CO2 15 (*) 19 - 32 mEq/L   Glucose, Bld 83  70 - 99 mg/dL   BUN 45 (*) 6 - 23 mg/dL   Creatinine, Ser 1.11 (*) 0.50 - 1.10 mg/dL   Calcium 8.1 (*) 8.4 - 10.5 mg/dL   GFR calc non Af Amer 44 (*) >90 mL/min   GFR calc Af Amer 51 (*) >90 mL/min   Comment: (NOTE)     The eGFR has been calculated using the CKD EPI equation.     This calculation has not been validated in all clinical situations.     eGFR's persistently <90 mL/min signify possible Chronic Kidney     Disease.  CBC     Status: Abnormal   Collection Time    08/19/13  4:23 AM      Result Value Ref Range   WBC 16.2 (*) 4.0 - 10.5 K/uL   RBC 3.01 (*) 3.87 - 5.11 MIL/uL   Hemoglobin 10.1 (*) 12.0 - 15.0 g/dL   HCT 30.4 (*) 36.0 - 46.0 %   MCV 101.0 (*) 78.0 - 100.0 fL   MCH 33.6  26.0 - 34.0 pg   MCHC 33.2  30.0 - 36.0 g/dL   RDW 17.4 (*) 11.5 - 15.5 %   Platelets 138 (*) 150 - 400 K/uL    Dg Chest Portable 1 View  08/18/2013   CLINICAL DATA:   Chest pain, hemoptysis  EXAM: PORTABLE CHEST - 1 VIEW  COMPARISON:  CT CHEST W/O CM dated 11/30/2012; DG CHEST 1V PORT dated 11/29/2012; DG ABD ACUTE W/CHEST dated 05/04/2012  FINDINGS: Heart size upper normal. Mild vascular congestion. Right lung is clear. On the left, there is hazy density over the lower lobe consistent with effusion and underlying consolidation.  IMPRESSION: Left-sided pleural effusion with lower lobe consolidation. More detailed evaluation with contrast-enhanced CT scan could be considered.   Electronically Signed   By: Skipper Cliche M.D.   On: 08/18/2013 16:49    ROS Blood pressure 96/63, pulse 56, temperature 97 F (36.1 C), temperature source Oral, resp. rate 31, height 5' 5" (1.651 m), weight 148 lb 13 oz (67.5 kg), SpO2 85.00%. Physical Exam Alert.. Skin warm and dry. Oral mucosa is moist.   . Sclera anicteric, conjunctivae is pink. Thyroid not enlarged. No cervical lymphadenopathy. Lungs: wheezes noted on left. Heart regular: rapid.  Abdomen is soft. Bowel sounds are positive. No hepatomegaly. No abdominal masses felt. No tenderness.  No edema to lower extremities.  .  Assessment/Plan: Hematemesis. Hemoglobin appears to be stable. Will monitor for now until patient is more stable.  I will discuss with Dr. Laural Golden.   SETZER,TERRI W 08/19/2013, 8:49 AM     GI attending note; Patient interviewed and examined. Patient able to provide limited history. 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 Patient was begun on broad-spectrum antibiotics. She has not experienced any more episodes of hematemesis or vomiting. She denies nausea or abdominal pain. She had small bowel movement and was not tarry. Patient is alert and responds appropriately to questions. Abdominal exam reveals  normal bowel sounds and soft abdomen without tenderness organomegaly or masses. Patient has received 1 unit of PRBCs. Hemoglobin on admission was 9.5 and from this morning 10.6. Assessment; Upper GI bleed appears to be inactive. She is at risk for peptic ulcer disease reflux esophagitis GI angiodysplasia or simply could have a Mallory-Weiss tear. Cannot rule out aspiration pneumonia. Since there is no evidence of active bleeding will continue empiric therapy with IV PPI and consider EGD when patient stable from a respiratory standpoint. Patient begun on full liquid diet and nursing staff has to watch her for dysphagia or aspiration. Patient will be reevaluated in the a.m.

## 2013-08-19 NOTE — Progress Notes (Signed)
Cardiology consult called to [facility]

## 2013-08-19 NOTE — Consult Note (Signed)
CARDIOLOGY CONSULT NOTE   Patient ID: MATTESON PETRICH MRN: [id number] DOB/AGE: [birth year] 80 y.o.  Admit Date: 08/18/2013 Referring Physician: PTH Primary Physician: Gerrit Halls, NP Consulting Cardiologist: Nona Dell MD Primary Cardiologist: Cassell Clement MD Reason for Consultation: Atrial fibrillation and hypotension.   Clinical Summary Ms. Bakeman is an 80 y.o.female with known history of PAF (not on anticoagulation), severe hypothyroidism, last seen by Dr. Patty Sermons in 2013 for pre-operative evaluation for right hip revision. She presented to the ER from [name] with hematemesis, hypotension, and atrial fibrillation with RVR, rates in the 120's. Due to hypotension, (BP 77/47) she was not started on Cardizem. She was found to be anemic with Hgb 9.2. FOBT was found to be positive.She was found to be hyperkalemic with potassium of 5.0.  She is being treated for sepsis with IV fluids and antibiotics. Central line has been placed. We are asked for recommendations for rate control.  Per Dr. Yevonne Pax note on 11/2010, her echocardiogram on 03/17/11 showed LVH with normal left ventricular systolic function and no significant valve abnormalities - unable to locate report. She had been on digoxin in the past, but on follow up with Dr. Patty Sermons in 2013 she was no longer on digoxin.   Patient has dementia and is not a good historian. History is obtained from current and PMH.    Allergies  Allergen Reactions  . Codeine Other (See Comments)    unknown  . Morphine And Related Other (See Comments)    AMS (per [name] documentation)   . Piroxicam Other (See Comments)    unknown    Medications Scheduled Medications: . sodium chloride   Intravenous STAT  . antiseptic oral rinse  15 mL Mouth Rinse TID AC & HS  . ceFEPime (MAXIPIME) IV  1 g Intravenous Q24H  . digoxin  0.5 mg Intravenous Once   Followed by  . digoxin  0.25 mg Intravenous Q6H  . [START ON 08/20/2013] digoxin  0.25  mg Intravenous Once  . pantoprazole (PROTONIX) IV  40 mg Intravenous Q12H  . pneumococcal 23 valent vaccine  0.5 mL Intramuscular Tomorrow-1000  . sodium chloride  10-40 mL Intracatheter Q12H  . vancomycin  750 mg Intravenous Q24H    Infusions: . sodium chloride 75 mL/hr at 08/19/13 0839    PRN Medications: acetaminophen, acetaminophen, sodium chloride   Past Medical History  Diagnosis Date  . Hypothyroidism   . PAF (paroxysmal atrial fibrillation)     Not a coumadin candidate  . Headache(784.0)   . Poor appetite   . UTI (urinary tract infection)   . Dysphagia     History of dysphagia 2, depressed by esophagus status post dilation  . Arthritis   . History of anxiety   . Orthostatic hypotension   . Inclusion body myositis   . Easy bruising   . Malnutrition/Cachexia     Pre-morbid body weight 150 lbs, 04/2012 <100 lbs   . Failure to thrive   . Macrocytosis   . Diarrhea     Past Surgical History  Procedure Laterality Date  . Total hip arthroplasty  2000 or 2001    right  . Transthoracic echocardiogram  07/08/10    EF 55-60%  . Left total hip arthroplasty  1996  . Total hip revision  12/18/2011    Procedure: TOTAL HIP REVISION;  Surgeon: Loanne Drilling, MD;  Location: [facility];  Service: Orthopedics;  Laterality: Right;  Right Hip Revision to Constrained Liner-acetabular   . Flexible  sigmoidoscopy  05/07/2012    Procedure: FLEXIBLE SIGMOIDOSCOPY;  Surgeon: Rogene Houston, MD;  Location: [facility];  Service: Endoscopy;  Laterality: N/A;  . Partial hysterectomy N/A     distant history, type unknown    Family History  Problem Relation Age of Onset  . Heart attack Father   . Heart attack Mother   . Cancer Brother     Social History Ms. Lachapelle reports that she has never smoked. She has never used smokeless tobacco. Ms. Stoffel reports that she does not drink alcohol.  Review of Systems Difficult to obtain due to dementia and limited history.   Physical  Examination Blood pressure 76/48, pulse 56, temperature 98.5 F (36.9 C), temperature source Oral, resp. rate 35, height 5\' 5"  (1.651 m), weight 148 lb 13 oz (67.5 kg), SpO2 85.00%.  Intake/Output Summary (Last 24 hours) at 08/19/13 1007 Last data filed at 08/19/13 0500  Gross per 24 hour  Intake 1342.5 ml  Output    750 ml  Net  592.5 ml    Telemetry: Atrial fib 120-140 bpm.  No distress. HEENT: Conjunctiva and lids normal, oropharynx clear. Neck: Supple, no elevated JVP or carotid bruits, no thyromegaly. Lungs: Upper airway congestion with coughing, no wheezes. Cardiac: Irregular rate and rhythm, tachycardic, no S3 or significant systolic murmur, no pericardial rub. Abdomen: Soft, nontender, bowel sounds present. Extremities: No pitting edema, distal pulses 1-2+. Skin: Warm and dry. Musculoskeletal:  Kyphosis noted. Neuropsychiatric: Alert and oriented x2, calm.   Lab Results  Basic Metabolic Panel:  Recent Labs Lab 08/18/13 1346 08/19/13 0423  NA 135* 142  K 5.5* 5.0  CL 103 113*  CO2 19 15*  GLUCOSE 110* 83  BUN 57* 45*  CREATININE 1.52* 1.11*  CALCIUM 8.8 8.1*    Liver Function Tests:  CBC:  Recent Labs Lab 08/18/13 1346 08/18/13 2303 08/19/13 0423  WBC 16.9*  --  16.2*  HGB 9.5* 10.6* 10.1*  HCT 29.5* 32.5* 30.4*  MCV 103.1*  --  101.0*  PLT 158  --  138*    Radiology: Dg Chest Portable 1 View  08/18/2013   CLINICAL DATA:  Chest pain, hemoptysis  EXAM: PORTABLE CHEST - 1 VIEW  COMPARISON:  CT CHEST W/O CM dated 11/30/2012; DG CHEST 1V PORT dated 11/29/2012; DG ABD ACUTE W/CHEST dated 05/04/2012  FINDINGS: Heart size upper normal. Mild vascular congestion. Right lung is clear. On the left, there is hazy density over the lower lobe consistent with effusion and underlying consolidation.  IMPRESSION: Left-sided pleural effusion with lower lobe consolidation. More detailed evaluation with contrast-enhanced CT scan could be considered.   Electronically  Signed   By: Skipper Cliche M.D.   On: 08/18/2013 16:49    ECG: Coarse atrial fibrillation with nonspecific T-wave changes.   Impression and Recommendations  1. Atrial fibrillation with RVR: Multifactorial in the setting of sepsis, GI bleed, hypotension. Creatinine is 1.1. Had been on digoxin in the past, but has not had this as part of her medical regimen since 2013. She will be loaded with digoxin IV for rate control as we are limited on her choices in the setting of hypotension. She will not be started on anticoagulation.  Repeat echocardiogram for LV function.  2. Sepsis: Ongoing treatment with IV antibiotics and IV fluids. Awaiting blood cultures.   3. GI bleed: She is hemoccult positive. She is being seen by GI. She is not on anticoagulation due to falls and mutiple co-morbidities.    Signed:  Phill Myron. Purcell Nails NP [facility] 08/19/2013, 10:07 AM Co-Sign MD   Attending note:  Patient seen and examined. Reviewed records and modified above note by Ms. Lawrence NP. Ms. Nuccio presents with known history of PAF, has not been anticoagulated in the past on assessment by Dr. Mare Ferrari given concerns about her bleeding risk. She now has evidence of sepsis associated with hypotension, potential GI bleeding with hematemesis. Rhythm is atrial fibrillation with RVR, and treatment options for rate control are somewhat limited in light of hypotension. Would not cardiovert as likelihood of recurrence is high with ongoing physiologic stress. Agree with digoxin load. Amiodarone might be cautiously considered next if rate control is inadequate, however this may also contribute to hypotension. Would not anticoagulate given prior discussions and also her active bleeding. Echocardiogram will be obtained for followup structural cardiac assessment.  Satira Sark, M.D., F.A.C.C.

## 2013-08-19 NOTE — Progress Notes (Signed)
[facility]                                                                              Patient Demographics  [name], is a 80 y.o. female, DOB - [birth year], VVO:[id number]  Admit date - 08/18/2013   Admitting Physician [name], DO  Outpatient Primary MD for the patient is [name], NP  LOS - 1   Chief Complaint  Patient presents with  . Hematemesis        Assessment & Plan   Septic Shock secondary to HCAP -Hypotension improving with fluid resuscitation and 1 unit of blood -General surgery place femoral line -Chest x-ray showed: Left-sided pleural effusion with lower lobe consolidation -Continue antibiotics: Vancomycin and cefepime -Will obtain urine Legionella and strep pneumonia antigen, sputum culture and Gram stain  Hematemesis/GI Bleed  -Patient currently n.p.o. -Received one unit of packed red blood cells -Hemoglobin currently stable at 10.1 -Gastroenterology consulted for further intervention and management -Patient did have positive occult -Continue Protonix IV 40 mg twice daily  Left-sided pleural effusion -Possibly secondary to volume given for hypotension -Will consult radiology for possible thoracentesis  Anemia secondary to acute blood loss  -Treatment and plan as above  Acute kidney injury with hyperkalemia  -Creatinine trending downward, 1.11 -Potassium also improved, 5.0 -Will continue to monitor  A. fib RVR  -Likely secondary to her septic shock and volume depletion. -Cardiology consulted for further intervention and management -TSH 1.185  History of dementia  Stable   Code Status: DO NOT RESUSCITATE  Family Communication: None at bedside  Disposition Plan: Admitted  Time Spent in minutes   30 minutes  Procedures  None  Consults   Gastroenterology Cardiology Interventional radiology  DVT Prophylaxis  SCDs  Lab Results  Component Value Date   PLT 138* 08/19/2013    Medications  Scheduled Meds: .  sodium chloride   Intravenous STAT  . antiseptic oral rinse  15 mL Mouth Rinse TID AC & HS  . ceFEPime (MAXIPIME) IV  1 g Intravenous Q24H  . pantoprazole (PROTONIX) IV  40 mg Intravenous Q12H  . pneumococcal 23 valent vaccine  0.5 mL Intramuscular Tomorrow-1000  . sodium chloride  10-40 mL Intracatheter Q12H  . vancomycin  750 mg Intravenous Q24H   Continuous Infusions: . sodium chloride Stopped (08/18/13 2048)   PRN Meds:.acetaminophen, acetaminophen, sodium chloride  Antibiotics    Anti-infectives   Start     Dose/Rate Route Frequency Ordered Stop   08/18/13 1800  vancomycin (VANCOCIN) IVPB 750 mg/150 ml premix     750 mg 150 mL/hr over 60 Minutes Intravenous Every 24 hours 08/18/13 1729     08/18/13 1800  ceFEPIme (MAXIPIME) 1 g in dextrose 5 % 50 mL IVPB     1 g 100 mL/hr over 30 Minutes Intravenous Every 24 hours 08/18/13 1729          Subjective:   [name] seen and examined today.  Patient has no complaints this morning.  Objective:   Filed Vitals:   08/19/13 0545 08/19/13 0600 08/19/13 0615 08/19/13 0630  BP: 92/61 101/62 112/80 96/63  Pulse:      Temp:  TempSrc:      Resp: 19 26 21 31   Height:      Weight:      SpO2:        Wt Readings from Last 3 Encounters:  08/19/13 67.5 kg (148 lb 13 oz)  08/16/13 63.504 kg (140 lb)  12/03/12 60.5 kg (133 lb 6.1 oz)     Intake/Output Summary (Last 24 hours) at 08/19/13 0836 Last data filed at 08/19/13 0500  Gross per 24 hour  Intake 1342.5 ml  Output    750 ml  Net  592.5 ml    Exam General: Well developed, malnourished, appears stated age  HEENT: NCAT, PERRLA, EOMI, Anicteic Sclera, mucous membranes dry.  Neck: Supple, no JVD, no masses  Cardiovascular: S1 S2 auscultated, irregularly irregular, tachycardic  Respiratory: Coarse breath sounds noted bilaterally, upper airway congestion Abdomen: Soft, diffusely tender, nondistended, + bowel sounds  Extremities: warm dry without cyanosis clubbing  or edema  Neuro: Alert and awake. Unable to fully assess due to her current state.  Skin: Without rashes exudates or nodules  Psych: Unable to assess  Data Review   Micro Results Recent Results (from the past 240 hour(s))  MRSA PCR SCREENING     Status: None   Collection Time    08/18/13  6:00 PM      Result Value Ref Range Status   MRSA by PCR NEGATIVE  NEGATIVE Final   Comment:            The GeneXpert MRSA Assay (FDA     approved for NASAL specimens     only), is one component of a     comprehensive MRSA colonization     surveillance program. It is not     intended to diagnose MRSA     infection nor to guide or     monitor treatment for     MRSA infections.    Radiology Reports Dg Chest Portable 1 View  08/18/2013   CLINICAL DATA:  Chest pain, hemoptysis  EXAM: PORTABLE CHEST - 1 VIEW  COMPARISON:  CT CHEST W/O CM dated 11/30/2012; DG CHEST 1V PORT dated 11/29/2012; DG ABD ACUTE W/CHEST dated 05/04/2012  FINDINGS: Heart size upper normal. Mild vascular congestion. Right lung is clear. On the left, there is hazy density over the lower lobe consistent with effusion and underlying consolidation.  IMPRESSION: Left-sided pleural effusion with lower lobe consolidation. More detailed evaluation with contrast-enhanced CT scan could be considered.   Electronically Signed   By: Skipper Cliche M.D.   On: 08/18/2013 16:49    CBC  Recent Labs Lab 08/18/13 1346 08/18/13 2303 08/19/13 0423  WBC 16.9*  --  16.2*  HGB 9.5* 10.6* 10.1*  HCT 29.5* 32.5* 30.4*  PLT 158  --  138*  MCV 103.1*  --  101.0*  MCH 33.2  --  33.6  MCHC 32.2  --  33.2  RDW 15.6*  --  17.4*    Chemistries   Recent Labs Lab 08/18/13 1346 08/19/13 0423  NA 135* 142  K 5.5* 5.0  CL 103 113*  CO2 19 15*  GLUCOSE 110* 83  BUN 57* 45*  CREATININE 1.52* 1.11*  CALCIUM 8.8 8.1*   ------------------------------------------------------------------------------------------------------------------ estimated  creatinine clearance is 33.3 ml/min (by C-G formula based on Cr of 1.11). ------------------------------------------------------------------------------------------------------------------ No results found for this basename: HGBA1C,  in the last 72 hours ------------------------------------------------------------------------------------------------------------------ No results found for this basename: CHOL, HDL, LDLCALC, TRIG, CHOLHDL, LDLDIRECT,  in the last 72  hours ------------------------------------------------------------------------------------------------------------------  Recent Labs  08/18/13 1346  TSH 1.185   ------------------------------------------------------------------------------------------------------------------ No results found for this basename: VITAMINB12, FOLATE, FERRITIN, TIBC, IRON, RETICCTPCT,  in the last 72 hours  Coagulation profile No results found for this basename: INR, PROTIME,  in the last 168 hours  No results found for this basename: DDIMER,  in the last 72 hours  Cardiac Enzymes No results found for this basename: CK, CKMB, TROPONINI, MYOGLOBIN,  in the last 168 hours ------------------------------------------------------------------------------------------------------------------ No components found with this basename: POCBNP,     Nashton Belson D.O. on 08/19/2013 at 8:36 AM  Between 7am to 7pm - Pager - [phone]  After 7pm go to [email or web address] - password [email or web address]  And look for the night coverage person covering for me after hours  [facility] Office  [phone]

## 2013-08-19 NOTE — Clinical Social Work Psychosocial (Signed)
Clinical Social Work Department BRIEF PSYCHOSOCIAL ASSESSMENT 08/19/2013  Patient:  [name], [name]     Account Number:  [id number]     Admit date:  08/18/2013  Clinical Social Worker:  Norina Buzzard Intern  Date/Time:  08/19/2013 10:55 AM  Referred by:  Physician  Date Referred:  08/19/2013 Referred for  SNF Placement   Other Referral:   Interview type:  Family Other interview type:   Daughter- [name]    PSYCHOSOCIAL DATA Living Status:  FACILITY Admitted from facility:  [facility] Level of care:   Primary support name:  [name] Primary support relationship to patient:  CHILD, ADULT Degree of support available:   very supportive per pt's daughter [name]    CURRENT CONCERNS Current Concerns  Post-Acute Placement   Other Concerns:    SOCIAL WORK ASSESSMENT / PLAN Spoke with pt's daughter [name]. Pt asleep in bed and has diagnosis of dementia. Per [name], the pt has been at [facility] for about 2 years. Pt long term nursing level. [name] reports pt was receiving physical therapy for her legs and hands at the facility, but this was discontinued. She explained that [name], the pt's son is her best support system and that he goes to visit the pt almost every day. [name] and pt lived together for some time before [facility]. [name] is now living with sister, [name]. [name] stated that she is unable to visit pt often because she is taking care of two grandchildren and she lives in [place]. Daughter plans for pt to return to [name] when medically clear. CSW will continue to follow.    Spoke with [name] at [facility] who confirms pt is long term. Pt has been at facility for two years and requires assistance with most ADLs, pt can feed herself. [name] reports the pt's son [name] come to see her every day. Facility agreeable to pt's return at d/c.   Assessment/plan status:   Other assessment/ plan:   Information/referral to community resources:    PATIENT'S/FAMILY'S RESPONSE TO PLAN OF CARE: Pt's  family and facility agreeable to pt's return when medically stable. CSW will continue to follow.   [name] BSW Intern

## 2013-08-20 LAB — CBC
HCT: 27.6 % — ABNORMAL LOW (ref 36.0–46.0)
Hemoglobin: 9.1 g/dL — ABNORMAL LOW (ref 12.0–15.0)
MCH: 34.2 pg — ABNORMAL HIGH (ref 26.0–34.0)
MCHC: 33 g/dL (ref 30.0–36.0)
MCV: 103.8 fL — AB (ref 78.0–100.0)
PLATELETS: 125 10*3/uL — AB (ref 150–400)
RBC: 2.66 MIL/uL — ABNORMAL LOW (ref 3.87–5.11)
RDW: 17.7 % — ABNORMAL HIGH (ref 11.5–15.5)
WBC: 13.6 10*3/uL — AB (ref 4.0–10.5)

## 2013-08-20 LAB — BASIC METABOLIC PANEL
BUN: 33 mg/dL — ABNORMAL HIGH (ref 6–23)
CALCIUM: 8.1 mg/dL — AB (ref 8.4–10.5)
CHLORIDE: 113 meq/L — AB (ref 96–112)
CO2: 14 meq/L — AB (ref 19–32)
Creatinine, Ser: 0.94 mg/dL (ref 0.50–1.10)
GFR calc Af Amer: 62 mL/min — ABNORMAL LOW (ref >90)
GFR calc non Af Amer: 53 mL/min — ABNORMAL LOW (ref >90)
Glucose, Bld: 66 mg/dL — ABNORMAL LOW (ref 70–99)
Potassium: 5.1 mEq/L (ref 3.7–5.3)
SODIUM: 142 meq/L (ref 137–147)

## 2013-08-20 LAB — STREP PNEUMONIAE URINARY ANTIGEN: Strep Pneumo Urinary Antigen: NEGATIVE

## 2013-08-20 MED ORDER — ONDANSETRON HCL 4 MG/2ML IJ SOLN
4.0000 mg | Freq: Four times a day (QID) | INTRAMUSCULAR | Status: DC | PRN
  Administered 2013-08-20: 4 mg via INTRAVENOUS
  Filled 2013-08-20: qty 2

## 2013-08-20 MED ORDER — HYDROCODONE-ACETAMINOPHEN 5-325 MG PO TABS
1.0000 | ORAL_TABLET | Freq: Four times a day (QID) | ORAL | Status: DC | PRN
  Administered 2013-08-20 – 2013-08-27 (×13): 1 via ORAL
  Filled 2013-08-20 (×14): qty 1

## 2013-08-20 MED ORDER — GUAIFENESIN-DM 100-10 MG/5ML PO SYRP
5.0000 mL | ORAL_SOLUTION | ORAL | Status: DC | PRN
  Administered 2013-08-20: 5 mL via ORAL
  Filled 2013-08-20: qty 5

## 2013-08-20 NOTE — Progress Notes (Signed)
[facility]                                                                              Patient Demographics  [name], is a 80 y.o. female, DOB - 1926-06-30, JJO:[id number]  Admit date - 08/18/2013   Admitting Physician [name], DO  Outpatient Primary MD for the patient is [name], NP  LOS - 2   Chief Complaint  Patient presents with  . Hematemesis        Assessment & Plan   Septic Shock secondary to HCAP -Hypotension improving with fluid resuscitation and 1 unit of blood, will discontinue fluids -General surgery place femoral line -Chest x-ray showed: Left-sided pleural effusion with lower lobe consolidation -Continue antibiotics: Vancomycin and cefepime -Pending urine Legionella and strep pneumonia antigen, sputum culture and Gram stain  Hematemesis/GI Bleed  -Received one unit of packed red blood cells -Hemoglobin currently stable at 9.1 -Gastroenterology consulted for further intervention and management -Patient did have positive occult -Continue Protonix IV 40 mg twice daily -Currently on dysphagia 1 diet and tolerating it well -No further episodes of hematemesis or vomiting  Left-sided pleural effusion -Possibly secondary to volume given for hypotension -Small effusion for thoracentesis  Anemia secondary to acute blood loss  -Treatment and plan as above  Acute kidney injury with hyperkalemia  -Creatinine trending downward, 0.94 -Potassium also improved, 5.1 -Will continue to monitor  A. fib RVR  -Likely secondary to her septic shock and volume depletion. -Cardiology consulted for further intervention and management -Patient given digoxin load and appears to have converted to NSR.   -TSH 1.185 -Patient not an anticoagulation candidate due to confusion and frailty  History of dementia  Stable   Code Status: DO NOT RESUSCITATE  Family Communication: None at bedside  Disposition Plan: Admitted  Time Spent in minutes   25  minutes  Procedures  None  Consults   Gastroenterology Cardiology Interventional radiology  DVT Prophylaxis  SCDs  Lab Results  Component Value Date   PLT 125* 08/20/2013    Medications  Scheduled Meds: . antiseptic oral rinse  15 mL Mouth Rinse TID AC & HS  . ceFEPime (MAXIPIME) IV  1 g Intravenous Q24H  . diclofenac sodium  2 g Topical BID  . digoxin  0.25 mg Intravenous Once  . levalbuterol  0.63 mg Nebulization TID  . pantoprazole (PROTONIX) IV  40 mg Intravenous Q12H  . sodium chloride  10-40 mL Intracatheter Q12H  . vancomycin  750 mg Intravenous Q24H   Continuous Infusions: . sodium chloride 75 mL/hr at 08/19/13 1500   PRN Meds:.acetaminophen, acetaminophen, levalbuterol, sodium chloride  Antibiotics    Anti-infectives   Start     Dose/Rate Route Frequency Ordered Stop   08/18/13 1800  vancomycin (VANCOCIN) IVPB 750 mg/150 ml premix     750 mg 150 mL/hr over 60 Minutes Intravenous Every 24 hours 08/18/13 1729     08/18/13 1800  ceFEPIme (MAXIPIME) 1 g in dextrose 5 % 50 mL IVPB     1 g 100 mL/hr over 30 Minutes Intravenous Every 24 hours 08/18/13 1729          Subjective:   [name]  seen and examined today.  Patient has no complaints this morning.   Objective:   Filed Vitals:   08/20/13 0400 08/20/13 0500 08/20/13 0600 08/20/13 0719  BP: 137/65 156/63 136/62   Pulse:      Temp: 97 F (36.1 C)     TempSrc: Axillary     Resp: 14 12 16    Height:      Weight:  66.7 kg (147 lb 0.8 oz)    SpO2:    92%    Wt Readings from Last 3 Encounters:  08/20/13 66.7 kg (147 lb 0.8 oz)  08/16/13 63.504 kg (140 lb)  12/03/12 60.5 kg (133 lb 6.1 oz)     Intake/Output Summary (Last 24 hours) at 08/20/13 0758 Last data filed at 08/20/13 0500  Gross per 24 hour  Intake 476.25 ml  Output    900 ml  Net -423.75 ml    Exam General: Well developed, malnourished, appears stated age  43: NCAT, EOMI, Anicteic Sclera, mucous membranes moist Neck:  Supple, no JVD, no masses  Cardiovascular: S1 S2 auscultated, RRR Respiratory: Poor inspiratory effort, upper airway congestion Abdomen: Soft, diffusely tender, nondistended, + bowel sounds  Extremities: warm dry without cyanosis clubbing or edema  Neuro: Alert and awake. Unable to fully assess due to her current state.  Skin: Without rashes exudates or nodules  Psych: Unable to assess  Data Review   Micro Results Recent Results (from the past 240 hour(s))  MRSA PCR SCREENING     Status: None   Collection Time    08/18/13  6:00 PM      Result Value Ref Range Status   MRSA by PCR NEGATIVE  NEGATIVE Final   Comment:            The GeneXpert MRSA Assay (FDA     approved for NASAL specimens     only), is one component of a     comprehensive MRSA colonization     surveillance program. It is not     intended to diagnose MRSA     infection nor to guide or     monitor treatment for     MRSA infections.    Radiology Reports Dg Chest Portable 1 View  08/18/2013   CLINICAL DATA:  Chest pain, hemoptysis  EXAM: PORTABLE CHEST - 1 VIEW  COMPARISON:  CT CHEST W/O CM dated 11/30/2012; DG CHEST 1V PORT dated 11/29/2012; DG ABD ACUTE W/CHEST dated 05/04/2012  FINDINGS: Heart size upper normal. Mild vascular congestion. Right lung is clear. On the left, there is hazy density over the lower lobe consistent with effusion and underlying consolidation.  IMPRESSION: Left-sided pleural effusion with lower lobe consolidation. More detailed evaluation with contrast-enhanced CT scan could be considered.   Electronically Signed   By: Skipper Cliche M.D.   On: 08/18/2013 16:49    CBC  Recent Labs Lab 08/18/13 1346 08/18/13 2303 08/19/13 0423 08/19/13 1303 08/20/13 0650  WBC 16.9*  --  16.2*  --  13.6*  HGB 9.5* 10.6* 10.1* 9.1* 9.1*  HCT 29.5* 32.5* 30.4* 27.4* 27.6*  PLT 158  --  138*  --  125*  MCV 103.1*  --  101.0*  --  103.8*  MCH 33.2  --  33.6  --  34.2*  MCHC 32.2  --  33.2  --  33.0  RDW  15.6*  --  17.4*  --  17.7*    Chemistries   Recent Labs Lab 08/18/13 1346 08/19/13 0423 08/20/13 0650  NA 135* 142 142  K 5.5* 5.0 5.1  CL 103 113* 113*  CO2 19 15* 14*  GLUCOSE 110* 83 66*  BUN 57* 45* 33*  CREATININE 1.52* 1.11* 0.94  CALCIUM 8.8 8.1* 8.1*   ------------------------------------------------------------------------------------------------------------------ estimated creatinine clearance is 38.7 ml/min (by C-G formula based on Cr of 0.94). ------------------------------------------------------------------------------------------------------------------ No results found for this basename: HGBA1C,  in the last 72 hours ------------------------------------------------------------------------------------------------------------------ No results found for this basename: CHOL, HDL, LDLCALC, TRIG, CHOLHDL, LDLDIRECT,  in the last 72 hours ------------------------------------------------------------------------------------------------------------------  Recent Labs  08/18/13 1346  TSH 1.185   ------------------------------------------------------------------------------------------------------------------ No results found for this basename: VITAMINB12, FOLATE, FERRITIN, TIBC, IRON, RETICCTPCT,  in the last 72 hours  Coagulation profile No results found for this basename: INR, PROTIME,  in the last 168 hours  No results found for this basename: DDIMER,  in the last 72 hours  Cardiac Enzymes No results found for this basename: CK, CKMB, TROPONINI, MYOGLOBIN,  in the last 168 hours ------------------------------------------------------------------------------------------------------------------ No components found with this basename: POCBNP,     Leeandra Ellerson D.O. on 08/20/2013 at 7:58 AM  Between 7am to 7pm - Pager - [phone]  After 7pm go to [email or web address] - password [email or web address]  And look for the night coverage person covering for me after hours  [facility] Office  [phone]

## 2013-08-20 NOTE — Progress Notes (Signed)
Patient ID: [name], female   DOB: [birth year], 80 y.o.   MRN: [id number]     Subjective:   Confused.    Objective:   Temp:  [97 F (36.1 C)-99.1 F (37.3 C)] 97.7 F (36.5 C) (02/13 0730) Resp:  [12-29] 16 (02/13 0600) BP: (86-158)/(47-118) 136/62 mmHg (02/13 0600) SpO2:  [91 %-92 %] 92 % (02/13 0719) Weight:  [147 lb 0.8 oz (66.7 kg)] 147 lb 0.8 oz (66.7 kg) (02/13 0500) Last BM Date: 08/19/13  Filed Weights   08/18/13 1752 08/19/13 0500 08/20/13 0500  Weight: 143 lb 11.8 oz (65.2 kg) 148 lb 13 oz (67.5 kg) 147 lb 0.8 oz (66.7 kg)    Intake/Output Summary (Last 24 hours) at 08/20/13 0903 Last data filed at 08/20/13 0500  Gross per 24 hour  Intake    450 ml  Output    900 ml  Net   -450 ml    Telemetry: NSR ( converted around 1am).   Exam:  General: No acute distress.  HEENT: Conjunctiva and lids normal, oropharynx clear.  Lungs: Upper airway congestion. Poor inspiratory effort.  Cardiac: No elevated JVP or bruits. RRR, no gallop or rub.   Abdomen: Normoactive bowel sounds, nontender, nondistended.  Extremities: No pitting edema, distal pulses full.  Neuropsychiatric: Sleepy. Confused.   Lab Results:  Basic Metabolic Panel:  Recent Labs Lab 08/18/13 1346 08/19/13 0423 08/20/13 0650  NA 135* 142 142  K 5.5* 5.0 5.1  CL 103 113* 113*  CO2 19 15* 14*  GLUCOSE 110* 83 66*  BUN 57* 45* 33*  CREATININE 1.52* 1.11* 0.94  CALCIUM 8.8 8.1* 8.1*    CBC:  Recent Labs Lab 08/18/13 1346  08/19/13 0423 08/19/13 1303 08/20/13 0650  WBC 16.9*  --  16.2*  --  13.6*  HGB 9.5*  < > 10.1* 9.1* 9.1*  HCT 29.5*  < > 30.4* 27.4* 27.6*  MCV 103.1*  --  101.0*  --  103.8*  PLT 158  --  138*  --  125*  < > = values in this interval not displayed.  Radiology: [place] Chest  08/19/2013   CLINICAL DATA:  Left pleural effusion question thoracentesis  EXAM: CHEST ULTRASOUND  COMPARISON:  Chest radiograph 08/18/2013  FINDINGS: Sonography of the left hemi thorax  demonstrates presence of a very small left pleural effusion.  Volume of fluid is insufficient for thoracentesis.  IMPRESSION: Very small left pleural effusion, insufficient for thoracentesis.   Electronically Signed   By: Lavonia Dana M.D.   On: 08/19/2013 12:13   Dg Chest Portable 1 View  08/18/2013   CLINICAL DATA:  Chest pain, hemoptysis  EXAM: PORTABLE CHEST - 1 VIEW  COMPARISON:  CT CHEST W/O CM dated 11/30/2012; DG CHEST 1V PORT dated 11/29/2012; DG ABD ACUTE W/CHEST dated 05/04/2012  FINDINGS: Heart size upper normal. Mild vascular congestion. Right lung is clear. On the left, there is hazy density over the lower lobe consistent with effusion and underlying consolidation.  IMPRESSION: Left-sided pleural effusion with lower lobe consolidation. More detailed evaluation with contrast-enhanced CT scan could be considered.   Electronically Signed   By: Skipper Cliche M.D.   On: 08/18/2013 16:49      Medications:   Scheduled Medications: . antiseptic oral rinse  15 mL Mouth Rinse TID AC & HS  . ceFEPime (MAXIPIME) IV  1 g Intravenous Q24H  . diclofenac sodium  2 g Topical BID  . digoxin  0.25 mg Intravenous Once  .  levalbuterol  0.63 mg Nebulization TID  . pantoprazole (PROTONIX) IV  40 mg Intravenous Q12H  . sodium chloride  10-40 mL Intracatheter Q12H  . vancomycin  750 mg Intravenous Q24H        PRN Medications: acetaminophen, acetaminophen, levalbuterol, sodium chloride   Assessment and Plan:   1.Atrial fib with RVR: Now converted to NSR in the 70's after IV digoxin load. BP is stable. Will continue po digoxin. Review of labs show that creatinine is 0.94.  She is stable at present. Not a candidate for anticoagulation due to frailty and confusion.   2. Sepsis: Continues on IV antibiotics. Blood cultures are pending. Maxipime  3. GI Bleed: Followed by GI.Recieved 1 unit of PRBC's. She is on PPI.  [name]

## 2013-08-20 NOTE — Evaluation (Signed)
Clinical/Bedside Swallow Evaluation Patient Details  Name: [name] MRN: [id number] Date of Birth: [birth year]  Today's Date: 08/20/2013 Time: 0263-7858 SLP Time Calculation (min): 20 min  Past Medical History:  Past Medical History  Diagnosis Date  . Hypothyroidism   . PAF (paroxysmal atrial fibrillation)     Not a coumadin candidate  . Headache(784.0)   . Poor appetite   . UTI (urinary tract infection)   . Dysphagia     History of dysphagia 2, depressed by esophagus status post dilation  . Arthritis   . History of anxiety   . Orthostatic hypotension   . Inclusion body myositis   . Easy bruising   . Malnutrition/Cachexia     Pre-morbid body weight 150 lbs, 04/2012 <100 lbs   . Failure to thrive   . Macrocytosis   . Diarrhea    Past Surgical History:  Past Surgical History  Procedure Laterality Date  . Total hip arthroplasty  2000 or 2001    right  . Transthoracic echocardiogram  07/08/10    EF 55-60%  . Left total hip arthroplasty  1996  . Total hip revision  12/18/2011    Procedure: TOTAL HIP REVISION;  Surgeon: Marissa Alf, MD;  Location: [facility];  Service: Orthopedics;  Laterality: Right;  Right Hip Revision to Constrained Liner-acetabular   . Flexible sigmoidoscopy  05/07/2012    Procedure: FLEXIBLE SIGMOIDOSCOPY;  Surgeon: Marissa Houston, MD;  Location: [facility];  Service: Endoscopy;  Laterality: N/A;  . Partial hysterectomy N/A     distant history, type unknown   HPI:  [name] is an 80 y.o.female with known history of PAF (not on anticoagulation), severe hypothyroidism, last seen by [name] in 2013 for pre-operative evaluation for right hip revision. She presented to the ER from [name] with hematemesis, hypotension, and atrial fibrillation with RVR, rates in the 120's. Due to hypotension, (BP 77/47) she was not started on Cardizem. She was found to be anemic with Hgb 9.2. FOBT was found to be positive.She was found to be hyperkalemic with  potassium of 5.0.  She is being treated for sepsis with IV fluids and antibiotics.    Assessment / Plan / Recommendation Clinical Impression  Pt was difficult to arouse initially, but she did become alert enough for PO intake after several attempts. She stated that she did not sleep well last night and needed her rest. Given explanation of procedure, she was cooperative. Oral motor structures were symmetrical and were WFL in regards to strength, ROM, and coordination. When presented with ice chips and thin liquids, pt demonstrated delayed wet cough. Nectar thickened liquids unfortunately yielded same results. Given honey thickened liquids and puree via spoon, she was able to swallow without overt s/sx of aspiration and/or penetration. However, given pt's respiratory status, she is at high risk for aspriation and should be fed with very strict aspiration precautions. Recommended diet is puree/honey liquids.     Aspiration Risk  Moderate    Diet Recommendation Honey-thick liquid;Dysphagia 1 (Puree)   Liquid Administration via: Spoon Medication Administration: Crushed with puree Supervision: Staff to assist with self feeding;Full supervision/cueing for compensatory strategies Compensations: Slow rate;Small sips/bites;Multiple dry swallows after each bite/sip;Clear throat intermittently Postural Changes and/or Swallow Maneuvers: Seated upright 90 degrees;Out of bed for meals;Upright 30-60 min after meal    Other  Recommendations Oral Care Recommendations: Oral care BID   Follow Up Recommendations    SLP to f/u 2x/wk/2wks.    Frequency and  Duration min 2x/week  2 weeks   Pertinent Vitals/Pain Pt did not report pain during this procedure.      Swallow Study Prior Functional Status   Pt was staying at [facility].     General Date of Onset: 08/20/13 HPI: [name] is an 80 y.o.female with known history of PAF (not on anticoagulation), severe hypothyroidism, last seen  by [name] in 2013 for pre-operative evaluation for right hip revision. She presented to the ER from [name] with hematemesis, hypotension, and atrial fibrillation with RVR, rates in the 120's. Due to hypotension, (BP 77/47) she was not started on Cardizem. She was found to be anemic with Hgb 9.2. FOBT was found to be positive.She was found to be hyperkalemic with potassium of 5.0.  She is being treated for sepsis with IV fluids and antibiotics.  Type of Study: Bedside swallow evaluation Previous Swallow Assessment: N/A Diet Prior to this Study: NPO Temperature Spikes Noted: No Respiratory Status: Nasal cannula Behavior/Cognition: Lethargic;Cooperative Oral Cavity - Dentition: Poor condition Self-Feeding Abilities: Needs assist Patient Positioning: Upright in bed Baseline Vocal Quality: Wet;Low vocal intensity Volitional Cough: Congested;Wet Volitional Swallow: Able to elicit    Oral/Motor/Sensory Function Overall Oral Motor/Sensory Function: Appears within functional limits for tasks assessed   Ice Chips Ice chips: Impaired Presentation: Spoon Pharyngeal Phase Impairments: Cough - Delayed;Wet Vocal Quality   Thin Liquid Thin Liquid: Impaired Presentation: Spoon Pharyngeal  Phase Impairments: Wet Vocal Quality;Cough - Delayed    Nectar Thick Nectar Thick Liquid: Impaired Presentation: Spoon Pharyngeal Phase Impairments: Wet Vocal Quality   Honey Thick Honey Thick Liquid: Within functional limits Presentation: Spoon Other Comments: SLP had pt cough until she felt comfortable before beginning this trial. 1/2 tsp presentations were given.   Puree Puree: Within functional limits Presentation: Spoon Other Comments: 1/2 tsp amounts only.   Solid   GO    Solid: Not tested Other Comments: Not tested d/t severity of dysphagia.        [name] S 08/20/2013,10:08 AM

## 2013-08-20 NOTE — Progress Notes (Signed)
Consulting cardiologist: Johnsie Cancel  Subjective:   Confused.    Objective:   Temp:  [97 F (36.1 C)-99.1 F (37.3 C)] 97 F (36.1 C) (02/13 0400) Resp:  [12-32] 16 (02/13 0600) BP: (86-158)/(47-118) 136/62 mmHg (02/13 0600) SpO2:  [91 %-92 %] 92 % (02/13 0719) Weight:  [147 lb 0.8 oz (66.7 kg)] 147 lb 0.8 oz (66.7 kg) (02/13 0500) Last BM Date: 08/19/13  Filed Weights   08/18/13 1752 08/19/13 0500 08/20/13 0500  Weight: 143 lb 11.8 oz (65.2 kg) 148 lb 13 oz (67.5 kg) 147 lb 0.8 oz (66.7 kg)    Intake/Output Summary (Last 24 hours) at 08/20/13 0834 Last data filed at 08/20/13 0500  Gross per 24 hour  Intake 476.25 ml  Output    900 ml  Net -423.75 ml    Telemetry: NSR ( converted around 1am).   Exam:  General: No acute distress.  HEENT: Conjunctiva and lids normal, oropharynx clear.  Lungs: Upper airway congestion. Poor inspiratory effort.  Cardiac: No elevated JVP or bruits. RRR, no gallop or rub.   Abdomen: Normoactive bowel sounds, nontender, nondistended.  Extremities: No pitting edema, distal pulses full.  Neuropsychiatric: Sleepy. Confused.   Lab Results:  Basic Metabolic Panel:  Recent Labs Lab 08/18/13 1346 08/19/13 0423 08/20/13 0650  NA 135* 142 142  K 5.5* 5.0 5.1  CL 103 113* 113*  CO2 19 15* 14*  GLUCOSE 110* 83 66*  BUN 57* 45* 33*  CREATININE 1.52* 1.11* 0.94  CALCIUM 8.8 8.1* 8.1*    CBC:  Recent Labs Lab 08/18/13 1346  08/19/13 0423 08/19/13 1303 08/20/13 0650  WBC 16.9*  --  16.2*  --  13.6*  HGB 9.5*  < > 10.1* 9.1* 9.1*  HCT 29.5*  < > 30.4* 27.4* 27.6*  MCV 103.1*  --  101.0*  --  103.8*  PLT 158  --  138*  --  125*  < > = values in this interval not displayed.  Radiology: [place] Chest  08/19/2013   CLINICAL DATA:  Left pleural effusion question thoracentesis  EXAM: CHEST ULTRASOUND  COMPARISON:  Chest radiograph 08/18/2013  FINDINGS: Sonography of the left hemi thorax demonstrates presence of a very small left  pleural effusion.  Volume of fluid is insufficient for thoracentesis.  IMPRESSION: Very small left pleural effusion, insufficient for thoracentesis.   Electronically Signed   By: Lavonia Dana M.D.   On: 08/19/2013 12:13   Dg Chest Portable 1 View  08/18/2013   CLINICAL DATA:  Chest pain, hemoptysis  EXAM: PORTABLE CHEST - 1 VIEW  COMPARISON:  CT CHEST W/O CM dated 11/30/2012; DG CHEST 1V PORT dated 11/29/2012; DG ABD ACUTE W/CHEST dated 05/04/2012  FINDINGS: Heart size upper normal. Mild vascular congestion. Right lung is clear. On the left, there is hazy density over the lower lobe consistent with effusion and underlying consolidation.  IMPRESSION: Left-sided pleural effusion with lower lobe consolidation. More detailed evaluation with contrast-enhanced CT scan could be considered.   Electronically Signed   By: Skipper Cliche M.D.   On: 08/18/2013 16:49      Medications:   Scheduled Medications: . antiseptic oral rinse  15 mL Mouth Rinse TID AC & HS  . ceFEPime (MAXIPIME) IV  1 g Intravenous Q24H  . diclofenac sodium  2 g Topical BID  . digoxin  0.25 mg Intravenous Once  . levalbuterol  0.63 mg Nebulization TID  . pantoprazole (PROTONIX) IV  40 mg Intravenous Q12H  . sodium  chloride  10-40 mL Intracatheter Q12H  . vancomycin  750 mg Intravenous Q24H        PRN Medications: acetaminophen, acetaminophen, levalbuterol, sodium chloride   Assessment and Plan:   1.Atrial fib with RVR: Now converted to NSR in the 70's after IV digoxin load. BP is stable. Will continue po digoxin. Review of labs show that creatinine is 0.94.  She is stable at present. Not a candidate for anticoagulation due to frailty and confusion.   2. Sepsis: Continues on IV antibiotics. Blood cultures are pending.  3. GI Bleed: Followed by GI.Recieved 1 unit of PRBC's. She is on PPI.  [name]

## 2013-08-20 NOTE — Progress Notes (Addendum)
Patient ID: [name], female   DOB: [birth year], 80 y.o.   MRN: [id number] HR down in 70s in a NSR. Resting quietly. Has been evaluated by Cardiology. Has been started on Digoxin. Had one small BM yesterday. Evidently was not checked for blood. There has been no hematemesis. Sound congested this am. CBC    Component Value Date/Time   WBC 13.6* 08/20/2013 0650   RBC 2.66* 08/20/2013 0650   RBC 2.70* 05/05/2012 0504   HGB 9.1* 08/20/2013 0650   HCT 27.6* 08/20/2013 0650   PLT 125* 08/20/2013 0650   MCV 103.8* 08/20/2013 0650   MCH 34.2* 08/20/2013 0650   MCHC 33.0 08/20/2013 0650   RDW 17.7* 08/20/2013 0650   LYMPHSABS 1.0 11/29/2012 2330   MONOABS 0.6 11/29/2012 2330   EOSABS 0.0 11/29/2012 2330   BASOSABS 0.0 11/29/2012 2330     Filed Vitals:   08/20/13 0500 08/20/13 0600 08/20/13 0719 08/20/13 0730  BP: 156/63 136/62    Pulse:      Temp:    97.7 F (36.5 C)  TempSrc:    Axillary  Resp: 12 16    Height:      Weight: 147 lb 0.8 oz (66.7 kg)     SpO2:   92%   No evidence of active GI bleed at this time. Agree with PPI. Will continue to monitor.    GI attending note; Patient is resting; she had bowel movement last night and passed small amount of brown stool.  patient has been evaluated by Ms. Precious Haws. Crutchfield of speech and language pathology and placed on dysphagia 1 diet. Upper GI bleed and Axid. Will continue to monitor H&H and keep her on IV PPI for now.

## 2013-08-20 NOTE — Clinical Social Work Note (Signed)
CSW updated [name] on pt and they remain willing to take pt whenever stable. CSW will continue to follow.  [name], [place]

## 2013-08-21 LAB — CBC
HEMATOCRIT: 31.6 % — AB (ref 36.0–46.0)
HEMOGLOBIN: 10.1 g/dL — AB (ref 12.0–15.0)
MCH: 32.6 pg (ref 26.0–34.0)
MCHC: 32 g/dL (ref 30.0–36.0)
MCV: 101.9 fL — ABNORMAL HIGH (ref 78.0–100.0)
Platelets: 112 10*3/uL — ABNORMAL LOW (ref 150–400)
RBC: 3.1 MIL/uL — ABNORMAL LOW (ref 3.87–5.11)
RDW: 17.5 % — ABNORMAL HIGH (ref 11.5–15.5)
WBC: 9.7 10*3/uL (ref 4.0–10.5)

## 2013-08-21 LAB — COMPREHENSIVE METABOLIC PANEL
ALK PHOS: 63 U/L (ref 39–117)
ALT: 8 U/L (ref 0–35)
AST: 18 U/L (ref 0–37)
Albumin: 1.9 g/dL — ABNORMAL LOW (ref 3.5–5.2)
BILIRUBIN TOTAL: 0.3 mg/dL (ref 0.3–1.2)
BUN: 27 mg/dL — AB (ref 6–23)
CHLORIDE: 114 meq/L — AB (ref 96–112)
CO2: 14 mEq/L — ABNORMAL LOW (ref 19–32)
Calcium: 8.3 mg/dL — ABNORMAL LOW (ref 8.4–10.5)
Creatinine, Ser: 0.81 mg/dL (ref 0.50–1.10)
GFR calc non Af Amer: 64 mL/min — ABNORMAL LOW (ref >90)
GFR, EST AFRICAN AMERICAN: 74 mL/min — AB (ref >90)
Glucose, Bld: 69 mg/dL — ABNORMAL LOW (ref 70–99)
POTASSIUM: 4.6 meq/L (ref 3.7–5.3)
Sodium: 142 mEq/L (ref 137–147)
TOTAL PROTEIN: 5.2 g/dL — AB (ref 6.0–8.3)

## 2013-08-21 LAB — LEGIONELLA ANTIGEN, URINE: LEGIONELLA ANTIGEN, URINE: NEGATIVE

## 2013-08-21 LAB — VANCOMYCIN, TROUGH: VANCOMYCIN TR: 13.5 ug/mL (ref 10.0–20.0)

## 2013-08-21 LAB — GLUCOSE, CAPILLARY: Glucose-Capillary: 87 mg/dL (ref 70–99)

## 2013-08-21 MED ORDER — SODIUM CHLORIDE 0.9 % IV SOLN: INTRAVENOUS | Status: DC

## 2013-08-21 MED ORDER — VANCOMYCIN HCL 500 MG IV SOLR
500.0000 mg | Freq: Two times a day (BID) | INTRAVENOUS | Status: DC
  Administered 2013-08-22 – 2013-08-25 (×7): 500 mg via INTRAVENOUS
  Filled 2013-08-21 (×9): qty 500

## 2013-08-21 NOTE — Progress Notes (Addendum)
[facility]                                                                              Patient Demographics  [name], is a 80 y.o. female, DOB - 04/09/1926, SWN:[id number]  Admit date - 08/18/2013   Admitting Physician [name], DO  Outpatient Primary MD for the patient is [name], NP  LOS - 3   Chief Complaint  Patient presents with  . Hematemesis        Assessment & Plan   Septic Shock secondary to HCAP/UTI -Hypotension improving with fluid resuscitation and 1 unit of blood, will discontinue fluids -General surgery place femoral line -Chest x-ray showed: Left-sided pleural effusion with lower lobe consolidation -Continue antibiotics: Vancomycin and cefepime -Pending urine Legionella sputum culture and Gram stain -Strep pneumonia Ag negative -UA shows TNTC WBC, small leukocytes  Hematemesis/GI Bleed  -Received one unit of packed red blood cells -Hemoglobin currently stable at 10.1 -Gastroenterology consulted for further intervention and management -Patient did have positive occult -Continue Protonix IV 40 mg twice daily -Currently on dysphagia 1 diet and tolerating it well -No further episodes of hematemesis or vomiting  Left-sided pleural effusion -Possibly secondary to volume given for hypotension -Small effusion for thoracentesis  Anemia secondary to acute blood loss  -Treatment and plan as above  Acute kidney injury with hyperkalemia  -Creatinine trending downward, 0.81 -Potassium 4.6 -Will continue to monitor  A. fib RVR  -Likely secondary to her septic shock and volume depletion. -Cardiology consulted for further intervention and management -Patient given digoxin load and appears to have converted to NSR.   -Per cardiology note, patient is to be on PO digoxin -TSH 1.185 -Patient not an anticoagulation candidate due to confusion and frailty  History of dementia  Stable   Code Status: DO NOT RESUSCITATE  Family  Communication: None at bedside  Disposition Plan: Admitted, will transfer to tele  Time Spent in minutes   20 minutes  Procedures  None  Consults   Gastroenterology Cardiology Interventional radiology  DVT Prophylaxis  SCDs  Lab Results  Component Value Date   PLT 112* 08/21/2013    Medications  Scheduled Meds: . antiseptic oral rinse  15 mL Mouth Rinse TID AC & HS  . ceFEPime (MAXIPIME) IV  1 g Intravenous Q24H  . diclofenac sodium  2 g Topical BID  . levalbuterol  0.63 mg Nebulization TID  . pantoprazole (PROTONIX) IV  40 mg Intravenous Q12H  . sodium chloride  10-40 mL Intracatheter Q12H  . vancomycin  750 mg Intravenous Q24H   Continuous Infusions:   PRN Meds:.acetaminophen, acetaminophen, guaiFENesin-dextromethorphan, HYDROcodone-acetaminophen, levalbuterol, ondansetron (ZOFRAN) IV, sodium chloride  Antibiotics    Anti-infectives   Start     Dose/Rate Route Frequency Ordered Stop   08/18/13 1800  vancomycin (VANCOCIN) IVPB 750 mg/150 ml premix     750 mg 150 mL/hr over 60 Minutes Intravenous Every 24 hours 08/18/13 1729     08/18/13 1800  ceFEPIme (MAXIPIME) 1 g in dextrose 5 % 50 mL IVPB     1 g 100 mL/hr over 30 Minutes Intravenous Every 24 hours 08/18/13 1729  Subjective:   [name] seen and examined today.  Patient complains of "pain everywhere" and her "back is popping."  She states her breathing seems to be better.  She continues to cough.  Objective:   Filed Vitals:   08/21/13 0400 08/21/13 0500 08/21/13 0600 08/21/13 0606  BP: 106/63 133/68  126/65  Pulse: 88 113    Temp: 97.8 F (36.6 C)     TempSrc: Oral     Resp: 10 22  22   Height:      Weight:   68.7 kg (151 lb 7.3 oz)   SpO2: 99% 94%      Wt Readings from Last 3 Encounters:  08/21/13 68.7 kg (151 lb 7.3 oz)  08/16/13 63.504 kg (140 lb)  12/03/12 60.5 kg (133 lb 6.1 oz)     Intake/Output Summary (Last 24 hours) at 08/21/13 0745 Last data filed at 08/21/13 0600   Gross per 24 hour  Intake   2445 ml  Output    900 ml  Net   1545 ml    Exam General: Well developed, malnourished, appears stated age  80: NCAT,  mucous membranes moist Neck: Supple, no JVD, no masses  Cardiovascular: S1 S2 auscultated, RRR Respiratory: Poor inspiratory effort, upper airway congestion Abdomen: Soft, diffusely tender, nondistended, + bowel sounds  Extremities: warm dry without cyanosis clubbing or edema  Neuro: Alert and awake. Unable to fully assess due to her current state.  Skin: Without rashes exudates or nodules  Psych: Unable to assess  Data Review   Micro Results Recent Results (from the past 240 hour(s))  MRSA PCR SCREENING     Status: None   Collection Time    08/18/13  6:00 PM      Result Value Ref Range Status   MRSA by PCR NEGATIVE  NEGATIVE Final   Comment:            The GeneXpert MRSA Assay (FDA     approved for NASAL specimens     only), is one component of a     comprehensive MRSA colonization     surveillance program. It is not     intended to diagnose MRSA     infection nor to guide or     monitor treatment for     MRSA infections.    Radiology Reports Dg Chest Portable 1 View  08/18/2013   CLINICAL DATA:  Chest pain, hemoptysis  EXAM: PORTABLE CHEST - 1 VIEW  COMPARISON:  CT CHEST W/O CM dated 11/30/2012; DG CHEST 1V PORT dated 11/29/2012; DG ABD ACUTE W/CHEST dated 05/04/2012  FINDINGS: Heart size upper normal. Mild vascular congestion. Right lung is clear. On the left, there is hazy density over the lower lobe consistent with effusion and underlying consolidation.  IMPRESSION: Left-sided pleural effusion with lower lobe consolidation. More detailed evaluation with contrast-enhanced CT scan could be considered.   Electronically Signed   By: Skipper Cliche M.D.   On: 08/18/2013 16:49    CBC  Recent Labs Lab 08/18/13 1346 08/18/13 2303 08/19/13 0423 08/19/13 1303 08/20/13 0650 08/21/13 0500  WBC 16.9*  --  16.2*  --  13.6*  9.7  HGB 9.5* 10.6* 10.1* 9.1* 9.1* 10.1*  HCT 29.5* 32.5* 30.4* 27.4* 27.6* 31.6*  PLT 158  --  138*  --  125* 112*  MCV 103.1*  --  101.0*  --  103.8* 101.9*  MCH 33.2  --  33.6  --  34.2* 32.6  MCHC 32.2  --  33.2  --  33.0 32.0  RDW 15.6*  --  17.4*  --  17.7* 17.5*    Chemistries   Recent Labs Lab 08/18/13 1346 08/19/13 0423 08/20/13 0650 08/21/13 0500  NA 135* 142 142 142  K 5.5* 5.0 5.1 4.6  CL 103 113* 113* 114*  CO2 19 15* 14* 14*  GLUCOSE 110* 83 66* 69*  BUN 57* 45* 33* 27*  CREATININE 1.52* 1.11* 0.94 0.81  CALCIUM 8.8 8.1* 8.1* 8.3*  AST  --   --   --  18  ALT  --   --   --  8  ALKPHOS  --   --   --  63  BILITOT  --   --   --  0.3   ------------------------------------------------------------------------------------------------------------------ estimated creatinine clearance is 48.6 ml/min (by C-G formula based on Cr of 0.81). ------------------------------------------------------------------------------------------------------------------ No results found for this basename: HGBA1C,  in the last 72 hours ------------------------------------------------------------------------------------------------------------------ No results found for this basename: CHOL, HDL, LDLCALC, TRIG, CHOLHDL, LDLDIRECT,  in the last 72 hours ------------------------------------------------------------------------------------------------------------------  Recent Labs  08/18/13 1346  TSH 1.185   ------------------------------------------------------------------------------------------------------------------ No results found for this basename: VITAMINB12, FOLATE, FERRITIN, TIBC, IRON, RETICCTPCT,  in the last 72 hours  Coagulation profile No results found for this basename: INR, PROTIME,  in the last 168 hours  No results found for this basename: DDIMER,  in the last 72 hours  Cardiac Enzymes No results found for this basename: CK, CKMB, TROPONINI, MYOGLOBIN,  in the last  168 hours ------------------------------------------------------------------------------------------------------------------ No components found with this basename: POCBNP,     Henny Strauch D.O. on 08/21/2013 at 7:45 AM  Between 7am to 7pm - Pager - [phone]  After 7pm go to [email or web address] - password [email or web address]  And look for the night coverage person covering for me after hours  [facility] Office  [phone]

## 2013-08-21 NOTE — Progress Notes (Signed)
ANTIBIOTIC CONSULT NOTE  Pharmacy Consult for Cefepime & Vanc Indication: Septic Shock secondary to HCAP/UTI  Allergies  Allergen Reactions  . Codeine Other (See Comments)    unknown  . Morphine And Related Other (See Comments)    AMS (per [name] documentation)   . Piroxicam Other (See Comments)    unknown    Patient Measurements: Height: 5\' 5"  (165.1 cm) Weight: 151 lb 7.3 oz (68.7 kg) IBW/kg (Calculated) : 57  Vital Signs: Temp: 98.5 F (36.9 C) (02/14 1345) Temp src: Oral (02/14 1345) BP: 135/68 mmHg (02/14 1345) Pulse Rate: 71 (02/14 1345) Intake/Output from previous day: 02/13 0701 - 02/14 0700 In: 2445 [P.O.:420; I.V.:1425; IV Piggyback:600] Out: 900 [Urine:900] Intake/Output from this shift: Total I/O In: 320 [P.O.:320] Out: 600 [Urine:600]  Labs:  Recent Labs  08/19/13 0423 08/19/13 1303 08/20/13 0650 08/21/13 0500  WBC 16.2*  --  13.6* 9.7  HGB 10.1* 9.1* 9.1* 10.1*  PLT 138*  --  125* 112*  CREATININE 1.11*  --  0.94 0.81   Estimated Creatinine Clearance: 48.6 ml/min (by C-G formula based on Cr of 0.81).  Recent Labs  08/21/13 1703  VANCOTROUGH 13.5     Microbiology: Recent Results (from the past 720 hour(s))  MRSA PCR SCREENING     Status: None   Collection Time    08/18/13  6:00 PM      Result Value Ref Range Status   MRSA by PCR NEGATIVE  NEGATIVE Final   Comment:            The GeneXpert MRSA Assay (FDA     approved for NASAL specimens     only), is one component of a     comprehensive MRSA colonization     surveillance program. It is not     intended to diagnose MRSA     infection nor to guide or     monitor treatment for     MRSA infections.    Medical History: Past Medical History  Diagnosis Date  . Hypothyroidism   . PAF (paroxysmal atrial fibrillation)     Not a coumadin candidate  . Headache(784.0)   . Poor appetite   . UTI (urinary tract infection)   . Dysphagia     History of dysphagia 2, depressed by  esophagus status post dilation  . Arthritis   . History of anxiety   . Orthostatic hypotension   . Inclusion body myositis   . Easy bruising   . Malnutrition/Cachexia     Pre-morbid body weight 150 lbs, 04/2012 <100 lbs   . Failure to thrive   . Macrocytosis   . Diarrhea     Medications:  Scheduled:  . antiseptic oral rinse  15 mL Mouth Rinse TID AC & HS  . ceFEPime (MAXIPIME) IV  1 g Intravenous Q24H  . diclofenac sodium  2 g Topical BID  . levalbuterol  0.63 mg Nebulization TID  . pantoprazole (PROTONIX) IV  40 mg Intravenous Q12H  . sodium chloride  10-40 mL Intracatheter Q12H  . vancomycin  750 mg Intravenous Q24H    Assessment: 80 yo F treated for Septic Shock secondary to HCAP/UTI.  Trough below goal.  Vancomycin 2/11>> Cefepime 2/11>>  Goal of Therapy:  Vancomycin trough level 15-20 mcg/ml  Plan:  Cefepime 1gm IV q24h Vancomycin 750mg  tonight as ordered, increase to 500mg  IV every 12 hours in AM. Repeat Vancomycin trough at steady state Monitor renal function and cx data   Pricilla Larsson  08/21/2013,6:29 PM

## 2013-08-21 NOTE — Progress Notes (Addendum)
Subjective;  Patient states she does not feel well but she does not have specific complaints. She denies shortness of breath. She states she is coughing less. She denies nausea vomiting or abdominal pain. She has poor appetite. Objective;  BP 140/72  Pulse 72  Temp(Src) 98.1 F (36.7 C) (Oral)  Resp 22  Ht 5\' 5"  (1.651 m)  Wt 151 lb 7.3 oz (68.7 kg)  BMI 25.20 kg/m2  SpO2 98% Patient is alert and appears to be in no acute distress. Cardiac exam with regular rhythm normal S1 and S2. No murmur noted. Lungs clear to auscultation anteriorly. Abdomen. Normal bowel sounds. On palpation is soft and nontender without organomegaly or masses. No LE edema noted.  Lab data;  WBC 9.7, H&H 10.1 and 31.6 and platelet count is 112K Serum sodium 142, potassium 4.6, right 114, CO2 14 BUN 27 creatinine 0.81 Glucose 69 Bilirubin oh 0.3, AB 63, AST 18, ALT 8 total protein 5.2 and albumin 1.9 and calcium 8.3.  Assessment;  #1. Upper GI bleed. She had coffee-ground emesis on admission but none thereafter. She has received 1 unit of PRBCs and hemoglobin is creeping up. She is being treated empirically for PUD or GERD. She could have had Mallory-Weiss tear. #2. Sepsis secondary to pneumonia improving with therapy. CO2 is still low. #3. Malnutrition. Serum albumin is low. Oral intake is poor. #4. History of atrial fibrillation. Asian has converted to NSR. #5.   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  Recommendations; Patient encouraged to increase by mouth intake. EGD if power of attorney agrees.    Addendum; Talked with patient's daughter [name]. Family is interested in finding the source of upper GI bleed. Will proceed with EGD in a.m.

## 2013-08-22 ENCOUNTER — Encounter (HOSPITAL_COMMUNITY)
Admission: EM | Disposition: A | Discharge status: Skilled Nursing Facility | Payer: Self-pay | Source: Home / Self Care | Attending: Internal Medicine

## 2013-08-22 ENCOUNTER — Encounter (HOSPITAL_COMMUNITY): Payer: Self-pay | Admitting: *Deleted

## 2013-08-22 DIAGNOSIS — N390 Urinary tract infection, site not specified: Secondary | ICD-10-CM

## 2013-08-22 DIAGNOSIS — D175 Benign lipomatous neoplasm of intra-abdominal organs: Secondary | ICD-10-CM

## 2013-08-22 DIAGNOSIS — K449 Diaphragmatic hernia without obstruction or gangrene: Secondary | ICD-10-CM

## 2013-08-22 DIAGNOSIS — K5710 Diverticulosis of small intestine without perforation or abscess without bleeding: Secondary | ICD-10-CM

## 2013-08-22 HISTORY — PX: ESOPHAGOGASTRODUODENOSCOPY: SHX5428

## 2013-08-22 LAB — INFLUENZA PANEL BY PCR (TYPE A & B)
H1N1FLUPCR: NOT DETECTED
INFLAPCR: NEGATIVE
INFLBPCR: NEGATIVE

## 2013-08-22 LAB — HEMOGLOBIN AND HEMATOCRIT, BLOOD
HCT: 30.2 % — ABNORMAL LOW (ref 36.0–46.0)
Hemoglobin: 10.2 g/dL — ABNORMAL LOW (ref 12.0–15.0)

## 2013-08-22 LAB — TROPONIN I: Troponin I: 2.44 ng/mL (ref <0.30)

## 2013-08-22 SURGERY — EGD (ESOPHAGOGASTRODUODENOSCOPY)
Anesthesia: Moderate Sedation

## 2013-08-22 MED ORDER — STERILE WATER FOR IRRIGATION IR SOLN
Status: DC | PRN
  Administered 2013-08-22: 10:00:00

## 2013-08-22 MED ORDER — MIDAZOLAM HCL 5 MG/5ML IJ SOLN
INTRAMUSCULAR | Status: DC | PRN
  Administered 2013-08-22 (×2): 1 mg via INTRAVENOUS

## 2013-08-22 MED ORDER — PANTOPRAZOLE SODIUM 40 MG PO TBEC
40.0000 mg | DELAYED_RELEASE_TABLET | Freq: Every day | ORAL | Status: DC
  Administered 2013-08-22 – 2013-08-30 (×8): 40 mg via ORAL
  Filled 2013-08-22 (×11): qty 1

## 2013-08-22 MED ORDER — MEPERIDINE HCL 50 MG/ML IJ SOLN
INTRAMUSCULAR | Status: AC
  Filled 2013-08-22: qty 1

## 2013-08-22 MED ORDER — MIDAZOLAM HCL 5 MG/5ML IJ SOLN
INTRAMUSCULAR | Status: AC
  Filled 2013-08-22: qty 10

## 2013-08-22 MED ORDER — METOPROLOL TARTRATE 25 MG PO TABS
12.5000 mg | ORAL_TABLET | Freq: Two times a day (BID) | ORAL | Status: DC
  Administered 2013-08-22 – 2013-08-23 (×3): 12.5 mg via ORAL
  Filled 2013-08-22 (×4): qty 1

## 2013-08-22 MED ORDER — BUTAMBEN-TETRACAINE-BENZOCAINE 2-2-14 % EX AERO
INHALATION_SPRAY | CUTANEOUS | Status: DC | PRN
  Administered 2013-08-22: 2 via TOPICAL

## 2013-08-22 NOTE — Progress Notes (Signed)
Patient c/o SOB and chest pain. o2 sats 97% on 2 liters. Heart rate got as high as 160 but now holding in the 120s. Contacted hospitalist. Order received to get stat troponin level and place patient on telemetry. Will continue to monitor closely.

## 2013-08-22 NOTE — Progress Notes (Signed)
CRITICAL VALUE ALERT  Critical value received:  Troponin 2.44  Date of notification:  08/22/13  Time of notification:  2150  Critical value read back:yes  Nurse who received alert: L. Corine Shelter, RN  MD notified (1st page):  Dr. Darrick Meigs  Time of first page:  2153  MD notified (2nd page):  Time of second page:  Responding MD:  Dr. Darrick Meigs  Time MD responded:  2158

## 2013-08-22 NOTE — Progress Notes (Signed)
[facility]                                                                              Patient Demographics  [name], is a 80 y.o. female, DOB - [birth year], KGM:[id number]  Admit date - 08/18/2013   Admitting Physician [name], DO  Outpatient Primary MD for the patient is Marissa Emperor, NP  LOS - 4   Chief Complaint  Patient presents with  . Hematemesis        Assessment & Plan   Septic Shock secondary to HCAP/UTI -Hypotension resolved with fluid resuscitation and 1 unit of blood -Chest x-ray showed: Left-sided pleural effusion with lower lobe consolidation -Continue antibiotics: Vancomycin and cefepime -Pending sputum culture and Gram stain -Legionella and Strep pneumonia Ag negative -UA shows TNTC WBC, small leukocytes  Hematemesis/GI Bleed  -Received one unit of packed red blood cells -Hemoglobin currently stable at 10.2 -Gastroenterology consulted for further intervention and management, patient may have EGD today -Patient did have positive occult -Continue Protonix IV 40 mg twice daily -Currently on dysphagia 1 diet and tolerating it well -No further episodes of hematemesis or vomiting  Left-sided pleural effusion -Possibly secondary to volume given for hypotension -Small effusion for thoracentesis  Anemia secondary to acute blood loss  -Treatment and plan as above  Acute kidney injury with hyperkalemia  -Resolved  A. fib RVR  -Likely secondary to her septic shock and volume depletion. -Cardiology consulted for further intervention and management -Patient given digoxin load and appears to have converted to NSR.   -Per cardiology note, patient is to be on PO digoxin -TSH 1.185 -Patient not an anticoagulation candidate due to confusion and frailty  History of dementia  Stable   Code Status: DO NOT RESUSCITATE  Family Communication: None at bedside  Disposition Plan: Admitted, may have EGD today.  If stable, may discharge within  1-2 days. Time Spent in minutes   20 minutes  Procedures  Femoral line placed by Dr. Manus Rudd   Gastroenterology Cardiology Interventional radiology  DVT Prophylaxis  SCDs  Lab Results  Component Value Date   PLT 112* 08/21/2013    Medications  Scheduled Meds: . antiseptic oral rinse  15 mL Mouth Rinse TID AC & HS  . ceFEPime (MAXIPIME) IV  1 g Intravenous Q24H  . diclofenac sodium  2 g Topical BID  . levalbuterol  0.63 mg Nebulization TID  . pantoprazole (PROTONIX) IV  40 mg Intravenous Q12H  . sodium chloride  10-40 mL Intracatheter Q12H  . vancomycin  500 mg Intravenous Q12H   Continuous Infusions: . sodium chloride     PRN Meds:.acetaminophen, acetaminophen, guaiFENesin-dextromethorphan, HYDROcodone-acetaminophen, levalbuterol, ondansetron (ZOFRAN) IV, sodium chloride  Antibiotics    Anti-infectives   Start     Dose/Rate Route Frequency Ordered Stop   08/22/13 0900  vancomycin (VANCOCIN) 500 mg in sodium chloride 0.9 % 100 mL IVPB     500 mg 100 mL/hr over 60 Minutes Intravenous Every 12 hours 08/21/13 1835     08/18/13 1800  vancomycin (VANCOCIN) IVPB 750 mg/150 ml premix  Status:  Discontinued     750 mg 150 mL/hr over 60 Minutes Intravenous Every 24  hours 08/18/13 1729 08/21/13 1835   08/18/13 1800  ceFEPIme (MAXIPIME) 1 g in dextrose 5 % 50 mL IVPB     1 g 100 mL/hr over 30 Minutes Intravenous Every 24 hours 08/18/13 1729          Subjective:   [name] seen and examined today.  Patient states she does not feel well, "I hurt all over."  Cannot specify where and does not have specific complaints. She continues to cough.  Objective:   Filed Vitals:   08/21/13 2123 08/22/13 0432 08/22/13 0546 08/22/13 0739  BP: 132/58 161/64 152/85   Pulse: 72 75    Temp: 97.6 F (36.4 C) 98.1 F (36.7 C)    TempSrc: Axillary Axillary    Resp: 20 20    Height:      Weight:      SpO2: 100% 100%  100%    Wt Readings from Last 3 Encounters:    08/21/13 68.7 kg (151 lb 7.3 oz)  08/21/13 68.7 kg (151 lb 7.3 oz)  08/16/13 63.504 kg (140 lb)     Intake/Output Summary (Last 24 hours) at 08/22/13 0805 Last data filed at 08/22/13 1751  Gross per 24 hour  Intake    120 ml  Output    800 ml  Net   -680 ml    Exam General: Well developed, malnourished, appears stated age  81: NCAT,  mucous membranes moist Neck: Supple, no JVD, no masses  Cardiovascular: S1 S2 auscultated, RRR Respiratory: Poor inspiratory effort, upper airway congestion Abdomen: Soft, diffusely tender, nondistended, + bowel sounds  Extremities: warm dry without cyanosis clubbing or edema  Neuro: Alert and awake. Unable to fully assess due to her current state.  Skin: Without rashes exudates or nodules   Data Review   Micro Results Recent Results (from the past 240 hour(s))  MRSA PCR SCREENING     Status: None   Collection Time    08/18/13  6:00 PM      Result Value Ref Range Status   MRSA by PCR NEGATIVE  NEGATIVE Final   Comment:            The GeneXpert MRSA Assay (FDA     approved for NASAL specimens     only), is one component of a     comprehensive MRSA colonization     surveillance program. It is not     intended to diagnose MRSA     infection nor to guide or     monitor treatment for     MRSA infections.    Radiology Reports Dg Chest Portable 1 View  08/18/2013   CLINICAL DATA:  Chest pain, hemoptysis  EXAM: PORTABLE CHEST - 1 VIEW  COMPARISON:  CT CHEST W/O CM dated 11/30/2012; DG CHEST 1V PORT dated 11/29/2012; DG ABD ACUTE W/CHEST dated 05/04/2012  FINDINGS: Heart size upper normal. Mild vascular congestion. Right lung is clear. On the left, there is hazy density over the lower lobe consistent with effusion and underlying consolidation.  IMPRESSION: Left-sided pleural effusion with lower lobe consolidation. More detailed evaluation with contrast-enhanced CT scan could be considered.   Electronically Signed   By: Skipper Cliche M.D.   On:  08/18/2013 16:49    CBC  Recent Labs Lab 08/18/13 1346  08/19/13 0423 08/19/13 1303 08/20/13 0650 08/21/13 0500 08/22/13 0459  WBC 16.9*  --  16.2*  --  13.6* 9.7  --   HGB 9.5*  < > 10.1* 9.1* 9.1*  10.1* 10.2*  HCT 29.5*  < > 30.4* 27.4* 27.6* 31.6* 30.2*  PLT 158  --  138*  --  125* 112*  --   MCV 103.1*  --  101.0*  --  103.8* 101.9*  --   MCH 33.2  --  33.6  --  34.2* 32.6  --   MCHC 32.2  --  33.2  --  33.0 32.0  --   RDW 15.6*  --  17.4*  --  17.7* 17.5*  --   < > = values in this interval not displayed.  Chemistries   Recent Labs Lab 08/18/13 1346 08/19/13 0423 08/20/13 0650 08/21/13 0500  NA 135* 142 142 142  K 5.5* 5.0 5.1 4.6  CL 103 113* 113* 114*  CO2 19 15* 14* 14*  GLUCOSE 110* 83 66* 69*  BUN 57* 45* 33* 27*  CREATININE 1.52* 1.11* 0.94 0.81  CALCIUM 8.8 8.1* 8.1* 8.3*  AST  --   --   --  18  ALT  --   --   --  8  ALKPHOS  --   --   --  63  BILITOT  --   --   --  0.3   ------------------------------------------------------------------------------------------------------------------ estimated creatinine clearance is 48.6 ml/min (by C-G formula based on Cr of 0.81). ------------------------------------------------------------------------------------------------------------------ No results found for this basename: HGBA1C,  in the last 72 hours ------------------------------------------------------------------------------------------------------------------ No results found for this basename: CHOL, HDL, LDLCALC, TRIG, CHOLHDL, LDLDIRECT,  in the last 72 hours ------------------------------------------------------------------------------------------------------------------ No results found for this basename: TSH, T4TOTAL, FREET3, T3FREE, THYROIDAB,  in the last 72 hours ------------------------------------------------------------------------------------------------------------------ No results found for this basename: VITAMINB12, FOLATE, FERRITIN, TIBC,  IRON, RETICCTPCT,  in the last 72 hours  Coagulation profile No results found for this basename: INR, PROTIME,  in the last 168 hours  No results found for this basename: DDIMER,  in the last 72 hours  Cardiac Enzymes No results found for this basename: CK, CKMB, TROPONINI, MYOGLOBIN,  in the last 168 hours ------------------------------------------------------------------------------------------------------------------ No components found with this basename: POCBNP,     Londyn Wotton D.O. on 08/22/2013 at 8:05 AM  Between 7am to 7pm - Pager - [phone]  After 7pm go to [email or web address] - password [email or web address]  And look for the night coverage person covering for me after hours  [facility] Office  [phone]

## 2013-08-22 NOTE — Progress Notes (Signed)
Called by nurse to report chest pain, patient had a brief episode of A. fib with RVR heart rate went up to 160s and then came down to 106, EKG showed A. fib. Cardiac enzymes were ordered, troponin came back elevated to 2.44. Patient seen and examined, denies chest pain at this time, comfortable. On exam-chest is clear" bilaterally heart S1-S2 irregular Assessment Elevated troponin Patient was admitted with septic shock, GI bleed had left-sided pleural effusion, had episode of hypotension in the hospital. Called and discussed with cardiologist on call at [place] Dr. Golden Hurter, and she recommends to continue medical management at [facility]. Recommended to start Lopressor 12.5 twice a day. No anticoagulation at this time. Elevated troponin is likely due to demand ischemia from A. fib with RVR and also multiple comorbidities. We'll continue to monitor the troponin, and if patient starts decompensating will again call cardiology.

## 2013-08-22 NOTE — Progress Notes (Signed)
MD notified of critical troponin level of 2.44. Dr. Darrick Meigs came up to the floor to examine the patient. Patient in no acute distress at this time. Says she no longer has chest pain. Vital signs BP 107/72, HR 90, O2sats 97% on 2L. Will continue to monitor closely.

## 2013-08-22 NOTE — Op Note (Signed)
EGD PROCEDURE REPORT  PATIENT:  [name]  MR#:  [id number] Birthdate:  March 30, 1926, 80 y.o., female Endoscopist:  Dr. Rogene Houston, MD Referred By:  Dr. [name], DO Procedure Date: 08/22/2013  Procedure:   EGD  Indications:  Patient is an 28 old Caucasian female was admitted earlier in the week with sepsis secondary to pneumonia and also had coffee-ground emesis. She has improved with therapy. She required 1 unit of PRBCs. Her H&H is stable around 10 g. She is undergoing diagnostic EGD.           Informed Consent:  The risks, benefits, alternatives & imponderables which include, but are not limited to, bleeding, infection, perforation, drug reaction and potential missed lesion have been reviewed.  The potential for biopsy, lesion removal, esophageal dilation, etc. have also been discussed.  Questions have been answered.  All parties agreeable.  Please see history & physical in medical record for more information. Informed consent was obtained from patient's daughter Ms. Hulen Shouts  Medications:  Versed 2 mg IV Cetacaine spray topically for oropharyngeal anesthesia  Description of procedure:  The endoscope was introduced through the mouth and advanced to the second portion of the duodenum without difficulty or limitations. The mucosal surfaces were surveyed very carefully during advancement of the scope and upon withdrawal.  Findings:  Esophagus:  Mucosa of the esophagus was normal. GE junction was unremarkable. GEJ:  37 cm Hiatus:  40 cm Stomach:  Stomach was empty and distended very well to insufflation. Folds in the proximal stomach were normal. Examination of mucosa at gastric body, antrum, pyloric channel, angularis, fundus and cardia was normal. Duodenum:  Normal bulbar mucosa. Large duodenal diverticulum noted along the medial wall and post bulbar region. 5-6 mm lipoma noted involving second part of duodenum.  Therapeutic/Diagnostic Maneuvers Performed:   None  Complications:  None  Impression: Small sliding hiatal hernia without evidence of erosive esophagitis. No evidence of peptic ulcer disease. Large post bulbar duodenal diverticulum without mucosal ulceration. Incidental finding of small duodenal lipoma.  Comment; No bleeding lesion identified. She possibly had Mallory-Weiss tear.  Recommendations:  Resume dysphagia 1 diet. Change pantoprazole to oral route.  REHMAN,NAJEEB U  08/22/2013  10:24 AM

## 2013-08-23 DIAGNOSIS — R799 Abnormal finding of blood chemistry, unspecified: Secondary | ICD-10-CM

## 2013-08-23 DIAGNOSIS — R7989 Other specified abnormal findings of blood chemistry: Secondary | ICD-10-CM

## 2013-08-23 DIAGNOSIS — R778 Other specified abnormalities of plasma proteins: Secondary | ICD-10-CM

## 2013-08-23 DIAGNOSIS — I739 Peripheral vascular disease, unspecified: Secondary | ICD-10-CM

## 2013-08-23 DIAGNOSIS — I059 Rheumatic mitral valve disease, unspecified: Secondary | ICD-10-CM

## 2013-08-23 DIAGNOSIS — D62 Acute posthemorrhagic anemia: Secondary | ICD-10-CM

## 2013-08-23 DIAGNOSIS — J189 Pneumonia, unspecified organism: Secondary | ICD-10-CM

## 2013-08-23 LAB — TROPONIN I
TROPONIN I: 3.26 ng/mL — AB (ref <0.30)
Troponin I: 3.42 ng/mL (ref <0.30)

## 2013-08-23 LAB — BASIC METABOLIC PANEL
BUN: 20 mg/dL (ref 6–23)
CHLORIDE: 113 meq/L — AB (ref 96–112)
CO2: 15 mEq/L — ABNORMAL LOW (ref 19–32)
Calcium: 8.4 mg/dL (ref 8.4–10.5)
Creatinine, Ser: 0.72 mg/dL (ref 0.50–1.10)
GFR, EST AFRICAN AMERICAN: 88 mL/min — AB (ref >90)
GFR, EST NON AFRICAN AMERICAN: 76 mL/min — AB (ref >90)
Glucose, Bld: 99 mg/dL (ref 70–99)
Potassium: 4.4 mEq/L (ref 3.7–5.3)
Sodium: 143 mEq/L (ref 137–147)

## 2013-08-23 LAB — HEMOGLOBIN AND HEMATOCRIT, BLOOD
HCT: 28.5 % — ABNORMAL LOW (ref 36.0–46.0)
HEMOGLOBIN: 9.7 g/dL — AB (ref 12.0–15.0)

## 2013-08-23 MED ORDER — PRO-STAT SUGAR FREE PO LIQD
30.0000 mL | Freq: Three times a day (TID) | ORAL | Status: DC
  Administered 2013-08-23 – 2013-08-30 (×14): 30 mL via ORAL
  Filled 2013-08-23 (×12): qty 30

## 2013-08-23 MED ORDER — ENSURE COMPLETE PO LIQD
237.0000 mL | Freq: Two times a day (BID) | ORAL | Status: DC
  Administered 2013-08-24 – 2013-08-29 (×5): 237 mL via ORAL

## 2013-08-23 NOTE — Progress Notes (Signed)
Patient's Troponin 3.42,Dr Sarajane Jews notified.

## 2013-08-23 NOTE — Progress Notes (Signed)
CRITICAL VALUE ALERT  Critical value received:  Troponin 3.26  Date of notification:  Was not notified by lab  Time of notification:  Was not notified by lab  Critical value read back: no  Nurse who received alert:    MD notified (1st page):  Dr. Darrick Meigs  Time of first page:  0515  MD notified (2nd page): Dr. Darrick Meigs  Time of second page: [phone]  Responding MD: Dr. Darrick Meigs  Time MD responded:  [phone]

## 2013-08-23 NOTE — Progress Notes (Signed)
SLP Cancellation Note  Patient Details Name: [name] MRN: [id number] DOB: [birth year]   Cancelled treatment:       Reason Eval/Treat Not Completed: Fatigue/lethargy limiting ability to participate; Spoke with nursing who informed SLP that pt had an eventful night (cardiac) and had a poor appetite today. SLP spoke with Ms. Stair who declined any/all po trials at this time. SLP will attempt to see pt tomorrow.  Thank you,  Genene Churn, [place]    [place] 08/23/2013, 3:11 PM

## 2013-08-23 NOTE — Progress Notes (Signed)
PROGRESS NOTE  [name] QVZ:[id number] DOB: Feb 13, 1926 DOA: 08/18/2013 PCP: [name], NP  Summary: 80 year old woman with complex past medical history presented with hematemesis. She was found to be hypotensive, admitted for possible septic shock, pneumonia, UTI, GI bleed, acute renal failure with hyperkalemia, atrial fibrillation with rapid ventricular response. She responded to blood transfusion, digoxin load, antibiotics and volume.  Assessment/Plan: 1. Atrial fibrillation with rapid ventricular response, positive troponin. In sinus rhythm again now. Troponin has trended upward slightly, patient is asymptomatic. Cardiology following. Thought to be related to demand ischemia. TSH normal. Not an anticoagulation candidate per previous documentation of physicians. 2. Septic shock secondary to HCAP/UTI. Septic shock appears resolved. Legionella and strep pneumoniae antigen negative. 3. HCAP with left-sided pleural effusion. Appears to be improving. Minimal hypoxia. Normal respiratory effort. 4. UTI. No urine culture was obtained. There has been on antibiotics for 5 days, will not obtain culture at this point. 5. Hematemesis/GI bleed. No apparent recurrence. Hemoglobin appears stable. Status post EGD 2/15 which did not demonstrate a bleeding lesion. GI postulated Mallory-Weiss tear. Transfused one unit packed red blood cells during this hospitalization 6. Acute blood loss anemia. As above. 7. Acute renal failure with hyperkalemia. Resolved. 8. Atrial fibrillation with rapid ventricular response. Secondary to septic shock and volume depletion. Cardiology following. Tolerated digoxin load, converted to sinus rhythm. TSH normal. Not anticoagulation candidate due to confusion and frailty per previous physicians' documentation 9. 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 Troponin elevated but asymptomatic. Hemodynamic stable. Await further recommendations from  cardiology. For now continue metoprolol BID, oxygen, telemetry.  Repeat troponin in the morning.  Check FLP   Not givin ASA last night secondary to h/o bleeding and NSAID allergy listed (reaction unknown)  Code Status: DNR DVT prophylaxis: SCDs Family Communication: none prseent Disposition Plan: pending  Murray Hodgkins, MD  [facility]  Pager [phone] If 7PM-7AM, please contact night-coverage at [email or web address], password [facility] 08/23/2013, 8:46 AM  LOS: 5 days   Consultants: Gastroenterology  Cardiology  Interventional radiology Speech therapy: honey thick liquid, dysphasia 1  Procedures:  Femoral line placed by Dr. Arnoldo Morale EGD 2/15: Small sliding hiatal hernia without evidence of erosive esophagitis.  No evidence of peptic ulcer disease.  Large post bulbar duodenal diverticulum without mucosal ulceration.  Incidental finding of small duodenal lipoma.  Antibiotics:  Cefepime 2/11 >>   Vancomycin 2/11 >>   HPI/Subjective: Per chart review patient complained of chest pain shortness of breath overnight, heart rate up to 160s atrial fibrillation with rapid ventricular response. Overnight troponin 2.44. The patient evaluated by night physician, he discussed with on-call cardiology, recommended Lopressor, no anticoagulation. Thought to be ischemia-related.  No complaints. She does have a history of dementia. She does not recall any problems last night. No nausea or vomiting. No pain.  Objective: Filed Vitals:   08/22/13 1948 08/22/13 2153 08/23/13 0438 08/23/13 0721  BP: 121/82 107/72 117/69   Pulse: 145 97 72   Temp: 98.2 F (36.8 C)  97.8 F (36.6 C)   TempSrc: Oral  Oral   Resp: 26 22 20    Height:      Weight:      SpO2: 97% 98% 100% 99%    Intake/Output Summary (Last 24 hours) at 08/23/13 0846 Last data filed at 08/22/13 1326  Gross per 24 hour  Intake     60 ml  Output      0 ml  Net     60  ml     Filed Weights   08/19/13 0500 08/20/13 0500  08/21/13 0600  Weight: 67.5 kg (148 lb 13 oz) 66.7 kg (147 lb 0.8 oz) 68.7 kg (151 lb 7.3 oz)    Exam:   Afebrile, vital signs stable.  Gen. Appears calm and comfortable. Nontoxic.  Psychiatric. Appears somewhat confused but calm, follows simples commands. Grossly normal mood and affect.  Cardiovascular. Regular rate and rhythm. No murmur, rub or gallop. No lower extremity edema. Feet are warm and dry.  Telemetry sinus rhythm.  Respiratory. Clear to auscultation bilaterally. No wheezes, rales or rhonchi. Normal respiratory effort.  Abdomen soft.  Data Reviewed:  Basic metabolic panel unremarkable.  Troponin 2.44 >> 3.26 >> 3.42  Hemoglobin 9.7, no significant change  Influenza negative  EKG: Atrial fibrillation with rapid ventricular response, poor quality study, no acute changes seen  Scheduled Meds: . antiseptic oral rinse  15 mL Mouth Rinse TID AC & HS  . ceFEPime (MAXIPIME) IV  1 g Intravenous Q24H  . diclofenac sodium  2 g Topical BID  . levalbuterol  0.63 mg Nebulization TID  . metoprolol tartrate  12.5 mg Oral BID  . pantoprazole  40 mg Oral Daily  . sodium chloride  10-40 mL Intracatheter Q12H  . vancomycin  500 mg Intravenous Q12H   Continuous Infusions:   Principal Problem:   Septic shock Active Problems:   Atrial fibrillation with RVR   Upper GI bleed   Hematemesis   AKI (acute kidney injury)   Hyperkalemia   Elevated troponin   Time spent 25 minutes

## 2013-08-23 NOTE — Consult Note (Signed)
Consulting cardiologist: Bronson Ing  Subjective:    Had recurrent chest discomfort and rapid afib last night. Troponin repeated and found to be elevated 2.44;3.26;3.42. Discussed with weekend/ night cardiologist, thought related to demand ischemia.   She is confused but cooperative. Denies discomfort. Does not remember HR going up or having chest discomfort.  Objective:   Temp:  [97.3 F (36.3 C)-98.6 F (37 C)] 98.6 F (37 C) (02/16 0857) Pulse Rate:  [72-145] 73 (02/16 0857) Resp:  [20-26] 20 (02/16 0857) BP: (107-140)/(66-82) 112/72 mmHg (02/16 0857) SpO2:  [95 %-100 %] 98 % (02/16 0857) Last BM Date: 08/21/13  Filed Weights   08/19/13 0500 08/20/13 0500 08/21/13 0600  Weight: 148 lb 13 oz (67.5 kg) 147 lb 0.8 oz (66.7 kg) 151 lb 7.3 oz (68.7 kg)    Intake/Output Summary (Last 24 hours) at 08/23/13 1010 Last data filed at 08/23/13 7829  Gross per 24 hour  Intake    120 ml  Output      0 ml  Net    120 ml    Telemetry: NSR  Exam:  General: No acute distress.  HEENT: Conjunctiva and lids normal, oropharynx clear.  Lungs: Inspiratory and expiratory crackles, with frequent productive coughing.   Cardiac: No elevated JVP or bruits. RRR, no gallop or rub.   Abdomen: Normoactive bowel sounds, nontender, nondistended.  Extremities: No pitting edema, distal pulses full.  Neuropsychiatric: Alert but pleasantly confused.   Lab Results:  Basic Metabolic Panel:  Recent Labs Lab 08/20/13 0650 08/21/13 0500 08/23/13 0232  NA 142 142 143  K 5.1 4.6 4.4  CL 113* 114* 113*  CO2 14* 14* 15*  GLUCOSE 66* 69* 99  BUN 33* 27* 20  CREATININE 0.94 0.81 0.72  CALCIUM 8.1* 8.3* 8.4    Liver Function Tests:  Recent Labs Lab 08/21/13 0500  AST 18  ALT 8  ALKPHOS 63  BILITOT 0.3  PROT 5.2*  ALBUMIN 1.9*    CBC:  Recent Labs Lab 08/19/13 0423  08/20/13 0650 08/21/13 0500 08/22/13 0459 08/23/13 0232  WBC 16.2*  --  13.6* 9.7  --   --   HGB 10.1*   < > 9.1* 10.1* 10.2* 9.7*  HCT 30.4*  < > 27.6* 31.6* 30.2* 28.5*  MCV 101.0*  --  103.8* 101.9*  --   --   PLT 138*  --  125* 112*  --   --   < > = values in this interval not displayed.  Cardiac Enzymes:  Recent Labs Lab 08/22/13 2050 08/23/13 0232 08/23/13 0812  TROPONINI 2.44* 3.26* 3.42*    Medications:   Scheduled Medications: . antiseptic oral rinse  15 mL Mouth Rinse TID AC & HS  . ceFEPime (MAXIPIME) IV  1 g Intravenous Q24H  . diclofenac sodium  2 g Topical BID  . levalbuterol  0.63 mg Nebulization TID  . metoprolol tartrate  12.5 mg Oral BID  . pantoprazole  40 mg Oral Daily  . sodium chloride  10-40 mL Intracatheter Q12H  . vancomycin  500 mg Intravenous Q12H    PRN Medications: acetaminophen, acetaminophen, guaiFENesin-dextromethorphan, HYDROcodone-acetaminophen, levalbuterol, ondansetron (ZOFRAN) IV, sodium chloride   Assessment and Plan:   1. NSTEMI:  Positive troponin in the setting of rapid afib. Thought to be demand ischemia. She does not remember incident. Was given lopressor 12.5 mg BID. Heart rate is well controlled currently. BP is low normal and stable. Echo pending.  2. Atrial fib with RVR: One episode last  evening as discussed above. Now remains in NSR rates in the 70's. She was loaded with digoxin but not placed on po dose thereafter. HR is now controlled on the metoprolol. Can consider restarting digoxin if she has broncho-spasms or we are unable to increase dose of BB due to hypotension.   3.Anemia: Stable, improved. Slightly lower than yesterday. Received one unit of PRBC;s during admission.  4. GI Bleed: EDG on 08/18/2013. No active bleeding or ulcer. GI note states possible Mallory-Weiss tear. Continues on PPI.    Phill Myron. Purcell Nails NP [facility] 08/23/2013, 10:10 AM

## 2013-08-23 NOTE — Progress Notes (Signed)
Patient has converted to sinus rhythm per telemetry tech. Will continue to monitor.

## 2013-08-23 NOTE — Consult Note (Signed)
The patient was seen and examined, and I agree with the assessment and plan as documented above, with modifications as noted below. Pt admitted with HCAP/UTI with consequent septic shock, anemia, atrial fibrillation with rapid ventricular response, and demand ischemia-related NSTEMI. She is currently confused but stable, and in normal sinus rhythm. She does not appear to be a good candidate for either antiplatelet therapy currently, nor has she been deemed a suitable candidate for anticoagulation. I would favor continuing conservative medical therapy only. Will f/u echocardiogram.

## 2013-08-23 NOTE — Progress Notes (Signed)
*  PRELIMINARY RESULTS* Echocardiogram 2D Echocardiogram has been performed.  [place], [place] 08/23/2013, 12:19 PM

## 2013-08-23 NOTE — Progress Notes (Signed)
INITIAL NUTRITION ASSESSMENT  DOCUMENTATION CODES Per approved criteria  -Severe malnutrition in the context of acute illness or injury   INTERVENTION: Ensure Complete po BID, each supplement provides 350 kcal and 13 grams of protein 30 ml Prostat TID, to provide 300 kcals and 45 grams protein.   NUTRITION DIAGNOSIS: Inadequate oral intake related to decreased appetite, increased needs due to wound healing as evidenced by PO: 10-50%, pressure ulcers.   Goal: Pt will meet >90% of estimated nutritional needs  Monitor:  PO intake, skin assessments, weight changes, labs, I/O's  Reason for Assessment: LOS, poor po intake  80 y.o. female  Admitting Dx: Septic shock  ASSESSMENT: Pt admitted for hematemesis and upper GI bleed. She is a resident of [name].  Pt with hx of FTT, however, wt hx reports wt gain over the past year, which is desirable given hx of malnutrition. Noted 37# (32.4%) wt gain x 1 year and 11.7% (7.8%) wt gain x 1 week, both of which are clinically significant. Unable to obtain much hx from pt due to dementia. She reports her appetite is fair. PO intake 10-50%.  Pt with multiple skin issues, including unstagebale pressure ulcer on lt heel.  Unable to complete full physical exam, due to pt unable to follow some commands and physical limitations.   Pt meets criteria for severe MALNUTRITION in the context of acute illness as evidenced by <50% estimated energy intake x 5 days, moderate fat and muscle depletion.  Nutrition Focused Physical Exam:  Subcutaneous Fat:  Orbital Region: moderate depletion Upper Arm Region: moderate depletion Thoracic and Lumbar Region: unable to assess  Muscle:  Temple Region: moderate depletion Clavicle Bone Region: mild depeltion Clavicle and Acromion Bone Region: mild depletion Scapular Bone Region: unable to assess Dorsal Hand: moderate depletion Patellar Region: unable to assess Anterior Thigh Region: unable to assess Posterior  Calf Region: unable to assess  Edema: unable to assess   Height: Ht Readings from Last 1 Encounters:  08/18/13 5\' 5"  (1.651 m)    Weight: Wt Readings from Last 1 Encounters:  08/21/13 151 lb 7.3 oz (68.7 kg)    Ideal Body Weight: 125#  % Ideal Body Weight: 128%  Wt Readings from Last 10 Encounters:  08/21/13 151 lb 7.3 oz (68.7 kg)  08/21/13 151 lb 7.3 oz (68.7 kg)  08/16/13 140 lb (63.504 kg)  12/03/12 133 lb 6.1 oz (60.5 kg)  07/28/12 114 lb (51.71 kg)  05/05/12 97 lb (44 kg)  05/05/12 97 lb (44 kg)  12/18/11 98 lb (44.453 kg)  12/18/11 98 lb (44.453 kg)  12/16/11 97 lb 1.6 oz (44.044 kg)    Usual Body Weight: 100#  % Usual Body Weight: 151%  BMI:  Body mass index is 25.2 kg/(m^2). Meets criteria for overweight.   Estimated Nutritional Needs: Kcal: 2094-7096 daily Protein: 86-103 grams daily Fluid: 2.0-2.4 L daily  Skin: unstageable pressure ulcer on lt heel, open wound on lt leg, dehisced lt elbow abrasion   Diet Order: Dysphagia  EDUCATION NEEDS: -Education not appropriate at this time   Intake/Output Summary (Last 24 hours) at 08/23/13 1022 Last data filed at 08/23/13 2836  Gross per 24 hour  Intake    120 ml  Output      0 ml  Net    120 ml    Last BM: 08/21/13   Labs:   Recent Labs Lab 08/20/13 0650 08/21/13 0500 08/23/13 0232  NA 142 142 143  K 5.1 4.6 4.4  CL 113*  114* 113*  CO2 14* 14* 15*  BUN 33* 27* 20  CREATININE 0.94 0.81 0.72  CALCIUM 8.1* 8.3* 8.4  GLUCOSE 66* 69* 99    CBG (last 3)   Recent Labs  08/21/13 1403  GLUCAP 87    Scheduled Meds: . antiseptic oral rinse  15 mL Mouth Rinse TID AC & HS  . ceFEPime (MAXIPIME) IV  1 g Intravenous Q24H  . diclofenac sodium  2 g Topical BID  . levalbuterol  0.63 mg Nebulization TID  . metoprolol tartrate  12.5 mg Oral BID  . pantoprazole  40 mg Oral Daily  . sodium chloride  10-40 mL Intracatheter Q12H  . vancomycin  500 mg Intravenous Q12H    Continuous Infusions:    Past Medical History  Diagnosis Date  . Hypothyroidism   . PAF (paroxysmal atrial fibrillation)     Not a coumadin candidate  . Headache(784.0)   . Poor appetite   . UTI (urinary tract infection)   . Dysphagia     History of dysphagia 2, depressed by esophagus status post dilation  . Arthritis   . History of anxiety   . Orthostatic hypotension   . Inclusion body myositis   . Easy bruising   . Malnutrition/Cachexia     Pre-morbid body weight 150 lbs, 04/2012 <100 lbs   . Failure to thrive   . Macrocytosis   . Diarrhea     Past Surgical History  Procedure Laterality Date  . Total hip arthroplasty  2000 or 2001    right  . Transthoracic echocardiogram  07/08/10    EF 55-60%  . Left total hip arthroplasty  1996  . Total hip revision  12/18/2011    Procedure: TOTAL HIP REVISION;  Surgeon: Gearlean Alf, MD;  Location: [facility];  Service: Orthopedics;  Laterality: Right;  Right Hip Revision to Constrained Liner-acetabular   . Flexible sigmoidoscopy  05/07/2012    Procedure: FLEXIBLE SIGMOIDOSCOPY;  Surgeon: Rogene Houston, MD;  Location: [facility];  Service: Endoscopy;  Laterality: N/A;  . Partial hysterectomy N/A     distant history, type unknown   Muhammadali Ries A. Jimmye Norman, RD, LDN Pager: [phone]

## 2013-08-23 NOTE — Care Management Note (Addendum)
    Page 1 of 1   08/30/2013     12:36:21 PM   CARE MANAGEMENT NOTE 08/30/2013  Patient:  KAHLYN, SHIPPEY   Account Number:  [id number]  Date Initiated:  08/23/2013  Documentation initiated by:  Theophilus Kinds  Subjective/Objective Assessment:   Pt admitted from [name]. Pt will return to facility at discharge.     Action/Plan:   CSW to arrange discharge to facility.   Anticipated DC Date:  08/27/2013   Anticipated DC Plan:  SKILLED NURSING FACILITY  In-house referral  Clinical Social Worker      DC Planning Services  CM consult      Choice offered to / List presented to:             Status of service:  Completed, signed off Medicare Important Message given?   (If response is "NO", the following Medicare IM given date fields will be blank) Date Medicare IM given:   Date Additional Medicare IM given:    Discharge Disposition:  [facility]  Per UR Regulation:    If discussed at Long Length of Stay Meetings, dates discussed:   08/24/2013  08/26/2013    Comments:  08/30/13 Dwight, RN BSN CM Pt discharged back to [name] today. CSW to arrange discharge to facility.  08/23/13 0840 Christinia Gully, RN BSN CM

## 2013-08-24 DIAGNOSIS — R1310 Dysphagia, unspecified: Secondary | ICD-10-CM

## 2013-08-24 DIAGNOSIS — G7241 Inclusion body myositis [IBM]: Secondary | ICD-10-CM

## 2013-08-24 DIAGNOSIS — E43 Unspecified severe protein-calorie malnutrition: Secondary | ICD-10-CM | POA: Insufficient documentation

## 2013-08-24 DIAGNOSIS — E039 Hypothyroidism, unspecified: Secondary | ICD-10-CM

## 2013-08-24 DIAGNOSIS — I214 Non-ST elevation (NSTEMI) myocardial infarction: Secondary | ICD-10-CM

## 2013-08-24 DIAGNOSIS — I519 Heart disease, unspecified: Secondary | ICD-10-CM

## 2013-08-24 DIAGNOSIS — R578 Other shock: Secondary | ICD-10-CM

## 2013-08-24 DIAGNOSIS — I2589 Other forms of chronic ischemic heart disease: Secondary | ICD-10-CM

## 2013-08-24 LAB — BASIC METABOLIC PANEL
BUN: 30 mg/dL — AB (ref 6–23)
CHLORIDE: 114 meq/L — AB (ref 96–112)
CO2: 14 mEq/L — ABNORMAL LOW (ref 19–32)
Calcium: 8.6 mg/dL (ref 8.4–10.5)
Creatinine, Ser: 0.96 mg/dL (ref 0.50–1.10)
GFR calc Af Amer: 60 mL/min — ABNORMAL LOW (ref >90)
GFR, EST NON AFRICAN AMERICAN: 52 mL/min — AB (ref >90)
Glucose, Bld: 124 mg/dL — ABNORMAL HIGH (ref 70–99)
Potassium: 4.3 mEq/L (ref 3.7–5.3)
Sodium: 145 mEq/L (ref 137–147)

## 2013-08-24 LAB — LIPID PANEL
CHOL/HDL RATIO: 4.7 ratio
Cholesterol: 178 mg/dL (ref 0–200)
HDL: 38 mg/dL — ABNORMAL LOW (ref >39)
LDL Cholesterol: 109 mg/dL — ABNORMAL HIGH (ref 0–99)
Triglycerides: 157 mg/dL — ABNORMAL HIGH (ref <150)
VLDL: 31 mg/dL (ref 0–40)

## 2013-08-24 LAB — CBC
HEMATOCRIT: 31.8 % — AB (ref 36.0–46.0)
HEMOGLOBIN: 10.5 g/dL — AB (ref 12.0–15.0)
MCH: 32.5 pg (ref 26.0–34.0)
MCHC: 33 g/dL (ref 30.0–36.0)
MCV: 98.5 fL (ref 78.0–100.0)
Platelets: 141 10*3/uL — ABNORMAL LOW (ref 150–400)
RBC: 3.23 MIL/uL — ABNORMAL LOW (ref 3.87–5.11)
RDW: 17.5 % — ABNORMAL HIGH (ref 11.5–15.5)
WBC: 11.3 10*3/uL — ABNORMAL HIGH (ref 4.0–10.5)

## 2013-08-24 LAB — TROPONIN I: Troponin I: 4.93 ng/mL (ref <0.30)

## 2013-08-24 MED ORDER — METOPROLOL SUCCINATE ER 25 MG PO TB24
25.0000 mg | ORAL_TABLET | Freq: Every day | ORAL | Status: DC
  Administered 2013-08-25 – 2013-08-28 (×4): 25 mg via ORAL
  Filled 2013-08-24 (×5): qty 1

## 2013-08-24 NOTE — Progress Notes (Addendum)
Speech Language Pathology Treatment: Dysphagia  Patient Details Name: [name] MRN: [id number] DOB: Nov 21, 1925 Today's Date: 08/24/2013 Time: 1300-1320 SLP Time Calculation (min): 20 min  Assessment / Plan / Recommendation Clinical Impression  [name] required encouragement to take in just a small amount of her lunch meal. Caregivers report that she has not been eating for quite some time (son and daughter in law). They report that pt will request sweet potato and when they bring it, she won't eat it. Pt with audible secretions and loosed cough after minimal po intake. Pt is at high risk for aspiration due to weakness and advanced age in setting of medical decline. SLP asked family what goals of care are- ie. Comfort, quality of life, po intake and family was unable to state (this was not the son with healthcare power of attorney). If family/MD wishes to liberalize diet and possibly increase risk for aspiration, consider allowing thin liquids or other pt preferences for comfort/pleasure. At this time, pt declines all po attempts with SLP. Will follow per direction of MD/goals of care. See results of MBSS from November 2013 below.   HPI HPI: [name] is an 80 y.o.female with known history of PAF (not on anticoagulation), severe hypothyroidism, last seen by Dr. Mare Ferrari in 2013 for pre-operative evaluation for right hip revision. She presented to the ER from [name] with hematemesis, hypotension, and atrial fibrillation with RVR, rates in the 120's. Due to hypotension, (BP 77/47) she was not started on Cardizem. She was found to be anemic with Hgb 9.2. FOBT was found to be positive.She was found to be hyperkalemic with potassium of 5.0.  She is being treated for sepsis with IV fluids and antibiotics.    Pertinent Vitals N/A  SLP Plan  Continue with current plan of care    Recommendations Diet recommendations: Dysphagia 1 (puree);Honey-thick liquid Liquids provided via:  Straw;Teaspoon Medication Administration: Crushed with puree Supervision: Staff to assist with self feeding;Full supervision/cueing for compensatory strategies Compensations: Slow rate;Small sips/bites;Multiple dry swallows after each bite/sip;Clear throat intermittently Postural Changes and/or Swallow Maneuvers: Seated upright 90 degrees;Out of bed for meals;Upright 30-60 min after meal              Oral Care Recommendations: Oral care BID Follow up Recommendations: 24 hour supervision/assistance Plan: Continue with current plan of care   Note: MBSS last completed November 2013 with the following results: Clinical impression: Moderate pharyngeal and cervical esophageal phase dysphagia characterized by delay in swallow initiation with pooling in pyriforms with thins, decreased tongue base retraction and hyolaryngeal excursion, with little to no epiglottic deflection, appearance of cervical osteophytes which did not seem to impede flow, with overall decreased pharyngeal pressure, and poor clearance at CP with suspected Zenker's diverticulum resulting in only one (surprisingly) episode flash penetration of thins via straw sip, significant vallecular and pyriform residue which was reduced with liquid wash and several repeat swallows. Pharynx was never entirely clear despite liquids and repeat swallows. Pt is at risk for aspiration given significant weakness and residuals, however risks can be minimized with use of strategies. Recommend: Dysphagia 3/mech soft (pt hates puree and tolerated regular texture about the same as puree) and thin liquids. Pt should be alert and upright for all po. Alternate consistencies and repeat dry swallows throughout. Pt will likely continue to need supplements due to decreased intake. (November 2013)      Thank you,  Genene Churn, [place]  [place] 08/24/2013, 2:45 PM

## 2013-08-24 NOTE — Progress Notes (Addendum)
[facility]                                                                              Patient Demographics  [name], is a 80 y.o. female, DOB - 09/02/1925, IEP:[id number]  Admit date - 08/18/2013   Admitting Physician Marissa Ford, DO  Outpatient Primary MD for the patient is Marissa Emperor, NP  LOS - 6   Chief Complaint  Patient presents with  . Hematemesis      Summary:  80 year old woman with complex past medical history presented with hematemesis. She was found to be hypotensive, admitted for possible septic shock, pneumonia, UTI, GI bleed, acute renal failure with hyperkalemia, atrial fibrillation with rapid ventricular response. She responded to blood transfusion, digoxin load, antibiotics and volume.   Assessment & Plan  NSTEMI -Patient had positive troponin, last one 4.93 -likely secondary to Afib RVR, demand ischemia -Cardiology following  -Continue metoprolol 12.5mg  BID PO  A. fib RVR  -Likely secondary to her septic shock and volume depletion. -Cardiology consulted for further intervention and management -Patient was given digoxin load and appears to have converted to NSR, however, had another episode -Per cardiology note, patient is to be on PO digoxin, however, only on metoprolol -TSH 1.185 -Patient not an anticoagulation/antiplatelet candidate due to confusion and frailty  Septic Shock secondary to HCAP/UTI, resolving -Hypotension resolved with fluid resuscitation and 1 unit of blood -Chest x-ray showed: Left-sided pleural effusion with lower lobe consolidation -Continue antibiotics: Vancomycin and cefepime -Pending sputum culture and Gram stain -Legionella and Strep pneumonia Ag negative -UA shows TNTC WBC, small leukocytes  Hematemesis/GI Bleed possibly Mallory-Weiss tear -Received one unit of packed red blood cells -Hemoglobin currently stable at 10.5 -Gastroenterology consulted for further intervention and management -Patient did  have positive occult -Continue Protonix PO 40mg  daily  -Currently on dysphagia 1 diet and tolerating it well -No further episodes of hematemesis or vomiting  Left-sided pleural effusion -Possibly secondary to volume given for hypotension -Small effusion for thoracentesis  Anemia secondary to acute blood loss  -Treatment and plan as above  Acute kidney injury with hyperkalemia  -Resolved  History of dementia  Stable  History of inclusion body myositis  Severe malnutrition -Continue feeding supplements  Code Status: DO NOT RESUSCITATE  Family Communication: None at bedside  Disposition Plan: Admitted, possible discharge within the next 1-2 days, troponin continues to rise, waiting on further recommendations from cardiology  Time Spent in minutes   25 minutes  Procedures  Femoral line placed by Dr. Arnoldo Morale  EGD 08/22/13 Small sliding hiatal hernia without evidence of erosive esophagitis.  No evidence of peptic ulcer disease.  Large post bulbar duodenal diverticulum without mucosal ulceration.  Incidental finding of small duodenal lipoma.   Consults   Gastroenterology Cardiology Interventional radiology  DVT Prophylaxis  SCDs  Lab Results  Component Value Date   PLT 141* 08/24/2013    Medications  Scheduled Meds: . antiseptic oral rinse  15 mL Mouth Rinse TID AC & HS  . ceFEPime (MAXIPIME) IV  1 g Intravenous Q24H  . diclofenac sodium  2 g Topical BID  . feeding supplement (ENSURE COMPLETE)  237 mL Oral BID  BM  . feeding supplement (PRO-STAT SUGAR FREE 64)  30 mL Oral TID WC  . levalbuterol  0.63 mg Nebulization TID  . metoprolol tartrate  12.5 mg Oral BID  . pantoprazole  40 mg Oral Daily  . sodium chloride  10-40 mL Intracatheter Q12H  . vancomycin  500 mg Intravenous Q12H   Continuous Infusions:   PRN Meds:.acetaminophen, acetaminophen, guaiFENesin-dextromethorphan, HYDROcodone-acetaminophen, levalbuterol, ondansetron (ZOFRAN) IV, sodium  chloride  Antibiotics    Anti-infectives   Start     Dose/Rate Route Frequency Ordered Stop   08/22/13 0900  vancomycin (VANCOCIN) 500 mg in sodium chloride 0.9 % 100 mL IVPB     500 mg 100 mL/hr over 60 Minutes Intravenous Every 12 hours 08/21/13 1835     08/18/13 1800  vancomycin (VANCOCIN) IVPB 750 mg/150 ml premix  Status:  Discontinued     750 mg 150 mL/hr over 60 Minutes Intravenous Every 24 hours 08/18/13 1729 08/21/13 1835   08/18/13 1800  ceFEPIme (MAXIPIME) 1 g in dextrose 5 % 50 mL IVPB     1 g 100 mL/hr over 30 Minutes Intravenous Every 24 hours 08/18/13 1729          Subjective:   [name] seen and examined today.  Patient has no complaints this morning.   Objective:   Filed Vitals:   08/23/13 1923 08/23/13 2109 08/24/13 0627 08/24/13 0654  BP:  127/65 120/75   Pulse:  77 73   Temp:  98.9 F (37.2 C) 97.2 F (36.2 C)   TempSrc:  Oral Oral   Resp:  18 19   Height:      Weight:      SpO2: 99% 100% 100% 99%    Wt Readings from Last 3 Encounters:  08/21/13 68.7 kg (151 lb 7.3 oz)  08/21/13 68.7 kg (151 lb 7.3 oz)  08/16/13 63.504 kg (140 lb)     Intake/Output Summary (Last 24 hours) at 08/24/13 5732 Last data filed at 08/23/13 1800  Gross per 24 hour  Intake    510 ml  Output      0 ml  Net    510 ml    Exam General: Well developed, malnourished, appears stated age  24: NCAT,  mucous membranes moist Neck: Supple, no JVD, no masses  Cardiovascular: S1 S2 auscultated, RRR Respiratory: Poor inspiratory effort, upper airway congestion Abdomen: Soft, diffusely tender, nondistended, + bowel sounds  Extremities: warm dry without cyanosis clubbing or edema  Neuro: Alert and awake. Unable to fully assess due to her current state.  Skin: Without rashes exudates or nodules   Data Review   Micro Results Recent Results (from the past 240 hour(s))  MRSA PCR SCREENING     Status: None   Collection Time    08/18/13  6:00 PM      Result Value  Ref Range Status   MRSA by PCR NEGATIVE  NEGATIVE Final   Comment:            The GeneXpert MRSA Assay (FDA     approved for NASAL specimens     only), is one component of a     comprehensive MRSA colonization     surveillance program. It is not     intended to diagnose MRSA     infection nor to guide or     monitor treatment for     MRSA infections.    Radiology Reports Dg Chest Portable 1 View  08/18/2013   CLINICAL DATA:  Chest pain, hemoptysis  EXAM: PORTABLE CHEST - 1 VIEW  COMPARISON:  CT CHEST W/O CM dated 11/30/2012; DG CHEST 1V PORT dated 11/29/2012; DG ABD ACUTE W/CHEST dated 05/04/2012  FINDINGS: Heart size upper normal. Mild vascular congestion. Right lung is clear. On the left, there is hazy density over the lower lobe consistent with effusion and underlying consolidation.  IMPRESSION: Left-sided pleural effusion with lower lobe consolidation. More detailed evaluation with contrast-enhanced CT scan could be considered.   Electronically Signed   By: Esperanza Heir M.D.   On: 08/18/2013 16:49    CBC  Recent Labs Lab 08/18/13 1346  08/19/13 0423  08/20/13 0650 08/21/13 0500 08/22/13 0459 08/23/13 0232 08/24/13 0453  WBC 16.9*  --  16.2*  --  13.6* 9.7  --   --  11.3*  HGB 9.5*  < > 10.1*  < > 9.1* 10.1* 10.2* 9.7* 10.5*  HCT 29.5*  < > 30.4*  < > 27.6* 31.6* 30.2* 28.5* 31.8*  PLT 158  --  138*  --  125* 112*  --   --  141*  MCV 103.1*  --  101.0*  --  103.8* 101.9*  --   --  98.5  MCH 33.2  --  33.6  --  34.2* 32.6  --   --  32.5  MCHC 32.2  --  33.2  --  33.0 32.0  --   --  33.0  RDW 15.6*  --  17.4*  --  17.7* 17.5*  --   --  17.5*  < > = values in this interval not displayed.  Chemistries   Recent Labs Lab 08/19/13 0423 08/20/13 0650 08/21/13 0500 08/23/13 0232 08/24/13 0453  NA 142 142 142 143 145  K 5.0 5.1 4.6 4.4 4.3  CL 113* 113* 114* 113* 114*  CO2 15* 14* 14* 15* 14*  GLUCOSE 83 66* 69* 99 124*  BUN 45* 33* 27* 20 30*  CREATININE 1.11* 0.94  0.81 0.72 0.96  CALCIUM 8.1* 8.1* 8.3* 8.4 8.6  AST  --   --  18  --   --   ALT  --   --  8  --   --   ALKPHOS  --   --  63  --   --   BILITOT  --   --  0.3  --   --    ------------------------------------------------------------------------------------------------------------------ estimated creatinine clearance is 41 ml/min (by C-G formula based on Cr of 0.96). ------------------------------------------------------------------------------------------------------------------ No results found for this basename: HGBA1C,  in the last 72 hours ------------------------------------------------------------------------------------------------------------------  Recent Labs  08/24/13 0453  CHOL 178  HDL 38*  LDLCALC 109*  TRIG 157*  CHOLHDL 4.7   ------------------------------------------------------------------------------------------------------------------ No results found for this basename: TSH, T4TOTAL, FREET3, T3FREE, THYROIDAB,  in the last 72 hours ------------------------------------------------------------------------------------------------------------------ No results found for this basename: VITAMINB12, FOLATE, FERRITIN, TIBC, IRON, RETICCTPCT,  in the last 72 hours  Coagulation profile No results found for this basename: INR, PROTIME,  in the last 168 hours  No results found for this basename: DDIMER,  in the last 72 hours  Cardiac Enzymes  Recent Labs Lab 08/23/13 0232 08/23/13 0812 08/24/13 0453  TROPONINI 3.26* 3.42* 4.93*   ------------------------------------------------------------------------------------------------------------------ No components found with this basename: POCBNP,     Zaide Mcclenahan D.O. on 08/24/2013 at 8:12 AM  Between 7am to 7pm - Pager - [phone]  After 7pm go to [email or web address] - password [email or web address]  And look for the night coverage person covering for me  after hours  [facility]  [phone]

## 2013-08-24 NOTE — Clinical Social Work Note (Signed)
CSW updated [name] on pt. Possible d/c tomorrow per MD. CSW to continue to follow.  Benay Pike, [place]

## 2013-08-24 NOTE — Progress Notes (Signed)
Brief visit for emotional/spiritual support. Patient asked for prayer.

## 2013-08-24 NOTE — Progress Notes (Signed)
ANTIBIOTIC CONSULT NOTE  Pharmacy Consult for Cefepime & Vancomycin Indication: Septic Shock secondary to HCAP/UTI  Allergies  Allergen Reactions  . Codeine Other (See Comments)    unknown  . Morphine And Related Other (See Comments)    AMS (per [name] documentation)   . Piroxicam Other (See Comments)    unknown   Patient Measurements: Height: 5\' 5"  (165.1 cm) Weight: 151 lb 7.3 oz (68.7 kg) IBW/kg (Calculated) : 57  Vital Signs: Temp: 97.2 F (36.2 C) (02/17 0627) Temp src: Oral (02/17 0627) BP: 120/75 mmHg (02/17 0627) Pulse Rate: 73 (02/17 0627) Intake/Output from previous day: 02/16 0701 - 02/17 0700 In: 610 [P.O.:60; IV Piggyback:550] Out: -  Intake/Output from this shift:    Labs:  Recent Labs  08/22/13 0459 08/23/13 0232 08/24/13 0453  WBC  --   --  11.3*  HGB 10.2* 9.7* 10.5*  PLT  --   --  141*  CREATININE  --  0.72 0.96   Estimated Creatinine Clearance: 41 ml/min (by C-G formula based on Cr of 0.96).  Recent Labs  08/21/13 1703  VANCOTROUGH 13.5    Microbiology: Recent Results (from the past 720 hour(s))  MRSA PCR SCREENING     Status: None   Collection Time    08/18/13  6:00 PM      Result Value Ref Range Status   MRSA by PCR NEGATIVE  NEGATIVE Final   Comment:            The GeneXpert MRSA Assay (FDA     approved for NASAL specimens     only), is one component of a     comprehensive MRSA colonization     surveillance program. It is not     intended to diagnose MRSA     infection nor to guide or     monitor treatment for     MRSA infections.   Medical History: Past Medical History  Diagnosis Date  . Hypothyroidism   . PAF (paroxysmal atrial fibrillation)     Not a coumadin candidate  . Headache(784.0)   . Poor appetite   . UTI (urinary tract infection)   . Dysphagia     History of dysphagia 2, depressed by esophagus status post dilation  . Arthritis   . History of anxiety   . Orthostatic hypotension   . Inclusion body  myositis   . Easy bruising   . Malnutrition/Cachexia     Pre-morbid body weight 150 lbs, 04/2012 <100 lbs   . Failure to thrive   . Macrocytosis   . Diarrhea    Medications:  Scheduled:  . antiseptic oral rinse  15 mL Mouth Rinse TID AC & HS  . ceFEPime (MAXIPIME) IV  1 g Intravenous Q24H  . diclofenac sodium  2 g Topical BID  . feeding supplement (ENSURE COMPLETE)  237 mL Oral BID BM  . feeding supplement (PRO-STAT SUGAR FREE 64)  30 mL Oral TID WC  . levalbuterol  0.63 mg Nebulization TID  . metoprolol tartrate  12.5 mg Oral BID  . pantoprazole  40 mg Oral Daily  . sodium chloride  10-40 mL Intracatheter Q12H  . vancomycin  500 mg Intravenous Q12H   Assessment: 80 yo F treated for Septic Shock secondary to HCAP/UTI.  Pt is improving clinically.  Afebrile.  Trough below goal when last checked and dose adjusted.    Vancomycin 2/11>> Cefepime 2/11>>  Goal of Therapy:  Vancomycin trough level 15-20 mcg/ml  Plan:  -  Continue Cefepime 1gm IV q24h - Continue Vancomycin 500mg  IV every 12 hours - Duration of therapy per MD - Repeat Vancomycin trough if Rx continues (weekly) - Monitor renal function and cx data   Hart Robinsons A 08/24/2013,10:14 AM

## 2013-08-24 NOTE — Progress Notes (Signed)
CRITICAL VALUE ALERT  Critical value received: troponin 4.93  Date of notification:  08/24/2013  Time of notification:  0635  Critical value read back:yes  Nurse who received alert:  L. Corine Shelter, RN  MD notified (1st page):  Dr. Darrick Meigs  Time of first page:  [phone]  MD notified (2nd page):  Time of second page:  Responding MD:   Time MD responded:

## 2013-08-24 NOTE — Progress Notes (Signed)
SUBJECTIVE: Pt remains weak, but denies chest pain and shortness of breath. Son and daughter-in-law in the room.     Intake/Output Summary (Last 24 hours) at 08/24/13 1459 Last data filed at 08/24/13 1429  Gross per 24 hour  Intake    250 ml  Output      0 ml  Net    250 ml    Current Facility-Administered Medications  Medication Dose Route Frequency Provider Last Rate Last Dose  . acetaminophen (TYLENOL) tablet 650 mg  650 mg Oral Q6H PRN Maryann Mikhail, DO   650 mg at 08/20/13 1801   Or  . acetaminophen (TYLENOL) suppository 650 mg  650 mg Rectal Q6H PRN Maryann Mikhail, DO      . antiseptic oral rinse (BIOTENE) solution 15 mL  15 mL Mouth Rinse TID AC & HS Maryann Mikhail, DO   15 mL at 08/24/13 0839  . ceFEPIme (MAXIPIME) 1 g in dextrose 5 % 50 mL IVPB  1 g Intravenous Q24H Lavonia Drafts [place]ill[place], RPH   1 g at 08/23/13 1629  . diclofenac sodium (VOLTAREN) 1 % transdermal gel 2 g  2 g Topical BID Maryann Mikhail, DO   2 g at 08/24/13 1019  . feeding supplement (ENSURE COMPLETE) (ENSURE COMPLETE) liquid 237 mL  237 mL Oral BID BM Jenifer A Williams, RD   237 mL at 08/24/13 1020  . feeding supplement (PRO-STAT SUGAR FREE 64) liquid 30 mL  30 mL Oral TID WC Jenifer A Williams, RD   30 mL at 08/24/13 0839  . guaiFENesin-dextromethorphan (ROBITUSSIN DM) 100-10 MG/5ML syrup 5 mL  5 mL Oral Q4H PRN Dianne Dun, NP   5 mL at 08/20/13 2049  . HYDROcodone-acetaminophen (NORCO/VICODIN) 5-325 MG per tablet 1 tablet  1 tablet Oral Q6H PRN Oswald Hillock, MD   1 tablet at 08/24/13 0442  . levalbuterol (XOPENEX) nebulizer solution 0.63 mg  0.63 mg Nebulization Q6H PRN Rhetta Mura Schorr, NP      . levalbuterol (XOPENEX) nebulizer solution 0.63 mg  0.63 mg Nebulization TID Rhetta Mura Schorr, NP   0.63 mg at 08/24/13 1350  . metoprolol tartrate (LOPRESSOR) tablet 12.5 mg  12.5 mg Oral BID Oswald Hillock, MD   12.5 mg at 08/23/13 2110  . ondansetron (ZOFRAN) injection 4 mg  4 mg  Intravenous Q6H PRN Oswald Hillock, MD   4 mg at 08/20/13 2024  . pantoprazole (PROTONIX) EC tablet 40 mg  40 mg Oral Daily Rogene Houston, MD   40 mg at 08/23/13 1118  . sodium chloride 0.9 % injection 10-40 mL  10-40 mL Intracatheter Q12H Jamesetta So, MD   10 mL at 08/24/13 1021  . sodium chloride 0.9 % injection 10-40 mL  10-40 mL Intracatheter PRN Jamesetta So, MD      . vancomycin (VANCOCIN) 500 mg in sodium chloride 0.9 % 100 mL IVPB  500 mg Intravenous Q12H Maryann Mikhail, DO   500 mg at 08/24/13 1019    Filed Vitals:   08/24/13 0627 08/24/13 0654 08/24/13 1350 08/24/13 1429  BP: 120/75   109/75  Pulse: 73   76  Temp: 97.2 F (36.2 C)   97.8 F (36.6 C)  TempSrc: Oral   Oral  Resp: 19   18  Height:      Weight:      SpO2: 100% 99% 97% 99%    PHYSICAL EXAM General: NAD, frail Neck: No JVD, no  thyromegaly or thyroid nodule.  Lungs: No rales/wheezes bilaterally with poor respiratory effort. CV: Nondisplaced PMI.  Regular rhythm, normal S1/S2, no S3/S4, no murmur.  No pretibial edema.  No carotid bruit.  Normal pedal pulses.  Abdomen: Soft, nontender, no hepatosplenomegaly, no distention.  Neurologic: Responds to questions appropriately  Psych: Flat affect. Extremities: No clubbing or cyanosis.   TELEMETRY: Reviewed telemetry pt in normal sinus rhythm with PVC's.  LABS: Basic Metabolic Panel:  Recent Labs  08/23/13 0232 08/24/13 0453  NA 143 145  K 4.4 4.3  CL 113* 114*  CO2 15* 14*  GLUCOSE 99 124*  BUN 20 30*  CREATININE 0.72 0.96  CALCIUM 8.4 8.6   Liver Function Tests: No results found for this basename: AST, ALT, ALKPHOS, BILITOT, PROT, ALBUMIN,  in the last 72 hours No results found for this basename: LIPASE, AMYLASE,  in the last 72 hours CBC:  Recent Labs  08/23/13 0232 08/24/13 0453  WBC  --  11.3*  HGB 9.7* 10.5*  HCT 28.5* 31.8*  MCV  --  98.5  PLT  --  141*   Cardiac Enzymes:  Recent Labs  08/23/13 0232 08/23/13 0812  08/24/13 0453  TROPONINI 3.26* 3.42* 4.93*   BNP: No components found with this basename: POCBNP,  D-Dimer: No results found for this basename: DDIMER,  in the last 72 hours Hemoglobin A1C: No results found for this basename: HGBA1C,  in the last 72 hours Fasting Lipid Panel:  Recent Labs  08/24/13 0453  CHOL 178  HDL 38*  LDLCALC 109*  TRIG 157*  CHOLHDL 4.7   Thyroid Function Tests: No results found for this basename: TSH, T4TOTAL, FREET3, T3FREE, THYROIDAB,  in the last 72 hours Anemia Panel: No results found for this basename: VITAMINB12, FOLATE, FERRITIN, TIBC, IRON, RETICCTPCT,  in the last 72 hours  RADIOLOGY: [place] Chest  08/19/2013   CLINICAL DATA:  Left pleural effusion question thoracentesis  EXAM: CHEST ULTRASOUND  COMPARISON:  Chest radiograph 08/18/2013  FINDINGS: Sonography of the left hemi thorax demonstrates presence of a very small left pleural effusion.  Volume of fluid is insufficient for thoracentesis.  IMPRESSION: Very small left pleural effusion, insufficient for thoracentesis.   Electronically Signed   By: Lavonia Dana M.D.   On: 08/19/2013 12:13   Dg Chest Portable 1 View  08/18/2013   CLINICAL DATA:  Chest pain, hemoptysis  EXAM: PORTABLE CHEST - 1 VIEW  COMPARISON:  CT CHEST W/O CM dated 11/30/2012; DG CHEST 1V PORT dated 11/29/2012; DG ABD ACUTE W/CHEST dated 05/04/2012  FINDINGS: Heart size upper normal. Mild vascular congestion. Right lung is clear. On the left, there is hazy density over the lower lobe consistent with effusion and underlying consolidation.  IMPRESSION: Left-sided pleural effusion with lower lobe consolidation. More detailed evaluation with contrast-enhanced CT scan could be considered.   Electronically Signed   By: Skipper Cliche M.D.   On: 08/18/2013 16:49   ECHO: - Left ventricle: The cavity size was normal. Wall thickness was increased in a pattern of moderate LVH. Systolic function was severely reduced. The estimated  ejection fraction was in the range of 20% to 25%. Doppler parameters are consistent with abnormal left ventricular relaxation (grade 1 diastolic dysfunction). Doppler parameters are consistent with high ventricular filling pressure. - Regional wall motion abnormality: Akinesis of the mid-apical anterior, mid anteroseptal, apical inferior, apical septal, apical lateral, and apical myocardium; severe hypokinesis of the mid anterolateral myocardium; moderate hypokinesis of the mid inferoseptal and mid inferolateral  myocardium. - Aortic valve: Mildly calcified annulus. Mildly thickened leaflets. Trivial regurgitation. - Mitral valve: Moderate mitral annular calcification. Mildly thickened leaflets . Mild regurgitation. - Left atrium: The atrium was mildly dilated. - Right ventricle: Systolic function was mildly reduced. - Tricuspid valve: Mild centrally directed regurgitation. - Pulmonary arteries: PA peak pressure: 62mm Hg (S). Mildly elevated pulmonary pressures. - Inferior vena cava: The vessel was dilated; the respirophasic diameter changes were blunted (< 50%); findings are consistent with elevated central venous pressure.     ASSESSMENT AND PLAN: Pt admitted with HCAP/UTI with consequent septic shock, anemia, atrial fibrillation with rapid ventricular response, and demand ischemia-related NSTEMI. Troponins peaked at 4.93. Echo shows severely reduced LV systolic function, EF 0000000. She is currently stable but weak and malnourished, and in normal sinus rhythm. She is not a good candidate for antiplatelet therapy given her admission with hematemesis, possible Mallory-Weiss tear, and positive hemoccults, nor has she been deemed a suitable candidate for anticoagulation in the past due to frailty and prior h/o GI bleed.  Her Hgb is stable at 10.5.  I would favor continuing conservative medical therapy only. Given her reduced LV function, will switch short-acting metoprolol to Toprol-XL  25 mg daily. I do not favor the use of Coreg or ACEI given her soft BP.  I did discuss code status with the family and her son said that another son, who is reportedly the POA, made her DNR/DNI. I concur with this decision.  Kate Sable, M.D., F.A.C.C.

## 2013-08-25 ENCOUNTER — Encounter (HOSPITAL_COMMUNITY): Payer: Self-pay | Admitting: Internal Medicine

## 2013-08-25 DIAGNOSIS — E872 Acidosis, unspecified: Secondary | ICD-10-CM

## 2013-08-25 DIAGNOSIS — I5022 Chronic systolic (congestive) heart failure: Secondary | ICD-10-CM

## 2013-08-25 LAB — BASIC METABOLIC PANEL
BUN: 42 mg/dL — ABNORMAL HIGH (ref 6–23)
CO2: 15 mEq/L — ABNORMAL LOW (ref 19–32)
Calcium: 8.7 mg/dL (ref 8.4–10.5)
Chloride: 117 mEq/L — ABNORMAL HIGH (ref 96–112)
Creatinine, Ser: 1.1 mg/dL (ref 0.50–1.10)
GFR, EST AFRICAN AMERICAN: 51 mL/min — AB (ref >90)
GFR, EST NON AFRICAN AMERICAN: 44 mL/min — AB (ref >90)
Glucose, Bld: 102 mg/dL — ABNORMAL HIGH (ref 70–99)
POTASSIUM: 4.2 meq/L (ref 3.7–5.3)
SODIUM: 148 meq/L — AB (ref 137–147)

## 2013-08-25 LAB — CBC
HEMATOCRIT: 34.3 % — AB (ref 36.0–46.0)
Hemoglobin: 11.4 g/dL — ABNORMAL LOW (ref 12.0–15.0)
MCH: 32.5 pg (ref 26.0–34.0)
MCHC: 33.2 g/dL (ref 30.0–36.0)
MCV: 97.7 fL (ref 78.0–100.0)
Platelets: 141 10*3/uL — ABNORMAL LOW (ref 150–400)
RBC: 3.51 MIL/uL — AB (ref 3.87–5.11)
RDW: 17.6 % — ABNORMAL HIGH (ref 11.5–15.5)
WBC: 12.6 10*3/uL — AB (ref 4.0–10.5)

## 2013-08-25 MED ORDER — DEXTROSE 5 % IV SOLN
INTRAVENOUS | Status: AC
  Administered 2013-08-25: 14:00:00 via INTRAVENOUS

## 2013-08-25 MED ORDER — LISINOPRIL 5 MG PO TABS
2.5000 mg | ORAL_TABLET | Freq: Every day | ORAL | Status: DC
  Administered 2013-08-25 – 2013-08-28 (×4): 2.5 mg via ORAL
  Filled 2013-08-25 (×5): qty 1

## 2013-08-25 MED ORDER — ATORVASTATIN CALCIUM 40 MG PO TABS
40.0000 mg | ORAL_TABLET | Freq: Every day | ORAL | Status: DC
  Administered 2013-08-25 – 2013-08-29 (×4): 40 mg via ORAL
  Filled 2013-08-25 (×5): qty 1

## 2013-08-25 MED ORDER — LEVOFLOXACIN 750 MG PO TABS
750.0000 mg | ORAL_TABLET | ORAL | Status: AC
  Administered 2013-08-25 – 2013-08-27 (×2): 750 mg via ORAL
  Filled 2013-08-25 (×2): qty 1

## 2013-08-25 MED ORDER — MORPHINE SULFATE 2 MG/ML IJ SOLN
1.0000 mg | Freq: Four times a day (QID) | INTRAMUSCULAR | Status: DC | PRN
  Administered 2013-08-25 – 2013-08-26 (×4): 1 mg via INTRAVENOUS
  Filled 2013-08-25 (×4): qty 1

## 2013-08-25 NOTE — Progress Notes (Signed)
08/25/13 1510 Called to patient's room this afternoon, family stated patient needed pain medication. Patient noted to be moaning, c/o "pain all over". Given vicodin 1 tablet po as ordered for pain at 1435. Family requested option of IV pain medication be available for faster pain control. Notified Dr. Algis Liming of request. Requested nursing staff clarify morphine allergy of altered mental status with family. Family states "she became confused", but they would like "to try low dose morphine and stop if needed for confusion". Discussed use of PRN medication and patient having no c/o pain, moaning, or other physical indicators of pain on previous pain assessments this morning. Family states they understand. Notified MD of request for low dose morphine. Stated will address. Donavan Foil, RN

## 2013-08-25 NOTE — Progress Notes (Addendum)
Patient ID: [name], female   DOB: 01/09/1926, 80 y.o.   MRN: [id number]     Subjective:    No events overnight  Objective:   Temp:  [97.5 F (36.4 C)-98.1 F (36.7 C)] 97.5 F (36.4 C) (02/18 0651) Pulse Rate:  [76-79] 79 (02/18 0651) Resp:  [18-21] 19 (02/18 0651) BP: (109-129)/(75-84) 129/84 mmHg (02/18 0651) SpO2:  [95 %-100 %] 100 % (02/18 0658) Last BM Date: 08/23/13 (per report)  Filed Weights   08/19/13 0500 08/20/13 0500 08/21/13 0600  Weight: 148 lb 13 oz (67.5 kg) 147 lb 0.8 oz (66.7 kg) 151 lb 7.3 oz (68.7 kg)    Intake/Output Summary (Last 24 hours) at 08/25/13 1150 Last data filed at 08/25/13 0924  Gross per 24 hour  Intake     90 ml  Output      0 ml  Net     90 ml    Telemetry: sinus rhythm, short episoe of SVT looks liek atach  Exam:  General:NAD  Resp: mild crackles at bases  Cardiac: RRR, no m/r/g, no JVD  EQ:ASTMHDQ soft, NT, ND  MSK: no LE edema    Lab Results:  Basic Metabolic Panel:  Recent Labs Lab 08/23/13 0232 08/24/13 0453 08/25/13 0458  NA 143 145 148*  K 4.4 4.3 4.2  CL 113* 114* 117*  CO2 15* 14* 15*  GLUCOSE 99 124* 102*  BUN 20 30* 42*  CREATININE 0.72 0.96 1.10  CALCIUM 8.4 8.6 8.7    Liver Function Tests:  Recent Labs Lab 08/21/13 0500  AST 18  ALT 8  ALKPHOS 63  BILITOT 0.3  PROT 5.2*  ALBUMIN 1.9*    CBC:  Recent Labs Lab 08/21/13 0500  08/23/13 0232 08/24/13 0453 08/25/13 0458  WBC 9.7  --   --  11.3* 12.6*  HGB 10.1*  < > 9.7* 10.5* 11.4*  HCT 31.6*  < > 28.5* 31.8* 34.3*  MCV 101.9*  --   --  98.5 97.7  PLT 112*  --   --  141* 141*  < > = values in this interval not displayed.  Cardiac Enzymes:  Recent Labs Lab 08/23/13 0232 08/23/13 0812 08/24/13 0453  TROPONINI 3.26* 3.42* 4.93*    BNP: No results found for this basename: PROBNP,  in the last 8760 hours  Coagulation: No results found for this basename: INR,  in the last 168 hours  ECG:   Medications:     Scheduled Medications: . antiseptic oral rinse  15 mL Mouth Rinse TID AC & HS  . ceFEPime (MAXIPIME) IV  1 g Intravenous Q24H  . diclofenac sodium  2 g Topical BID  . feeding supplement (ENSURE COMPLETE)  237 mL Oral BID BM  . feeding supplement (PRO-STAT SUGAR Leon 64)  30 mL Oral TID WC  . levalbuterol  0.63 mg Nebulization TID  . metoprolol succinate  25 mg Oral Daily  . pantoprazole  40 mg Oral Daily  . sodium chloride  10-40 mL Intracatheter Q12H  . vancomycin  500 mg Intravenous Q12H     Infusions:     PRN Medications:  acetaminophen, acetaminophen, guaiFENesin-dextromethorphan, HYDROcodone-acetaminophen, levalbuterol, ondansetron (ZOFRAN) IV, sodium chloride     Assessment/Plan    1. NSTEMI - likely demand ischemia, managed conservatively given her multiple medical comorbidities - not candidate for DAPT or anticoag given GI bleeds - will start statin and low dose ACE-I  2.Atrial fib with RVR:  - on low dose Toprol XL with  good rate control - previously deemed poor candidate for anticoag given age, fraility, and GI bleed  3. Systolic heart failure - LVEF 20-25% by most recent echo, appears to be a new finding.  - continue medical therapy, given comorbidities and overall health status defer any ischemic evaluation at this time - start low dose ACE-I, mild increase in Cr but not at this point prohibitive for startin ACE - on my evaluation she appears to be hypovolemic at this time. Her BUN, Cr are trending up as well as the cell lines in her CBC to suggests she is becoming hemoconcentrated. She has mild crackles on exam but no JVD or LE edema, do not recommend diuretics at this time. Recommend repeating chest xray.       Carlyle Dolly, M.D., F.A.C.C.

## 2013-08-25 NOTE — Progress Notes (Signed)
ANTIBIOTIC CONSULT NOTE - INITIAL  Pharmacy Consult for Levaquin Indication: pneumonia  Allergies  Allergen Reactions  . Codeine Other (See Comments)    unknown  . Morphine And Related Other (See Comments)    AMS (per [name] documentation)   . Piroxicam Other (See Comments)    unknown   Patient Measurements: Height: 5\' 5"  (165.1 cm) Weight: 151 lb 7.3 oz (68.7 kg) IBW/kg (Calculated) : 57  Vital Signs: Temp: 97.5 F (36.4 C) (02/18 0651) Temp src: Oral (02/18 0651) BP: 129/84 mmHg (02/18 0651) Pulse Rate: 79 (02/18 0651) Intake/Output from previous day:   Intake/Output from this shift: Total I/O In: 90 [P.O.:60; I.V.:30] Out: -   Labs:  Recent Labs  08/23/13 0232 08/24/13 0453 08/25/13 0458  WBC  --  11.3* 12.6*  HGB 9.7* 10.5* 11.4*  PLT  --  141* 141*  CREATININE 0.72 0.96 1.10   Estimated Creatinine Clearance: 35.8 ml/min (by C-G formula based on Cr of 1.1). No results found for this basename: VANCOTROUGH, Corlis Leak, VANCORANDOM, GENTTROUGH, GENTPEAK, GENTRANDOM, TOBRATROUGH, TOBRAPEAK, TOBRARND, AMIKACINPEAK, AMIKACINTROU, AMIKACIN,  in the last 72 hours   Microbiology: Recent Results (from the past 720 hour(s))  MRSA PCR SCREENING     Status: None   Collection Time    08/18/13  6:00 PM      Result Value Ref Range Status   MRSA by PCR NEGATIVE  NEGATIVE Final   Comment:            The GeneXpert MRSA Assay (FDA     approved for NASAL specimens     only), is one component of a     comprehensive MRSA colonization     surveillance program. It is not     intended to diagnose MRSA     infection nor to guide or     monitor treatment for     MRSA infections.   Medical History: Past Medical History  Diagnosis Date  . Hypothyroidism   . PAF (paroxysmal atrial fibrillation)     Not a coumadin candidate  . Headache(784.0)   . Poor appetite   . UTI (urinary tract infection)   . Dysphagia     History of dysphagia 2, depressed by esophagus status post  dilation  . Arthritis   . History of anxiety   . Orthostatic hypotension   . Inclusion body myositis   . Easy bruising   . Malnutrition/Cachexia     Pre-morbid body weight 150 lbs, 04/2012 <100 lbs   . Failure to thrive   . Macrocytosis   . Diarrhea    Medications:  Scheduled:  . antiseptic oral rinse  15 mL Mouth Rinse TID AC & HS  . atorvastatin  40 mg Oral q1800  . diclofenac sodium  2 g Topical BID  . feeding supplement (ENSURE COMPLETE)  237 mL Oral BID BM  . feeding supplement (PRO-STAT SUGAR FREE 64)  30 mL Oral TID WC  . levalbuterol  0.63 mg Nebulization TID  . levofloxacin  750 mg Oral Q48H  . lisinopril  2.5 mg Oral Daily  . metoprolol succinate  25 mg Oral Daily  . pantoprazole  40 mg Oral Daily  . sodium chloride  10-40 mL Intracatheter Q12H   Assessment: 80yo female admitted with sepsis secondary to HCAP.  Pt has been on Vancomycin and Cefepime since 2/11.  These d/c'd in favor of single agent Levaquin x 3 more days.  Estimated Creatinine Clearance: 35.8 ml/min (by C-G formula based on  Cr of 1.1).  Vancomycin 2/11 >> 2/18 Cefepime 2/11 >> 2/18 Levaquin 2/18 >>2/20  Goal of Therapy:  Eradicate infection.  Plan:  Levaquin 750mg  PO q48hrs x 2 doses (renally adjusted) Monitor progress  Hart Robinsons A 08/25/2013,1:41 PM

## 2013-08-25 NOTE — Progress Notes (Signed)
Pharmacist Heart Failure Core Measure Documentation  Assessment: [name] has an EF documented as 20-25% on 08/23/13 by echo.  Rationale: Heart failure patients with left ventricular systolic dysfunction (LVSD) and an EF < 40% should be prescribed an angiotensin converting enzyme inhibitor (ACEI) or angiotensin receptor blocker (ARB) at discharge unless a contraindication is documented in the medical record.  This patient is not currently on an ACEI or ARB for HF.  This note is being placed in the record in order to provide documentation that a contraindication to the use of these agents is present for this encounter.  ACE Inhibitor or Angiotensin Receptor Blocker is contraindicated (specify all that apply)  []   ACEI allergy AND ARB allergy []   Angioedema []   Moderate or severe aortic stenosis []   Hyperkalemia [x]   Hypotension (soft BP per cardiology) []   Renal artery stenosis []   Worsening renal function, preexisting renal disease or dysfunction   Hart Robinsons A 08/25/2013 8:48 AM

## 2013-08-25 NOTE — Progress Notes (Addendum)
PROGRESS NOTE    [name] DOB: 17-Jul-1925 DOA: 08/18/2013 PCP: [name], NP Primary cardiologist: Dr. [name]  HPI/Brief narrative 80 year old female with history of dementia, failure to thrive, PAF, hypothyroid, inclusion body myositis, anxiety and dysphagia was admitted on 08/18/13 with hematemesis, presumed septic shock secondary to HCAP, acute blood loss anemia, acute renal failure with hyperkalemia and A. fib with RVR. She was treated with IV fluids, broad-spectrum IV antibiotics, received 1 unit of PRBCs, PPIs, loaded with digoxin and reverted to sinus rhythm. After stabilization, she was transferred to telemetry floor.  Assessment/Plan:  1. Septic shock secondary to healthcare associated pneumonia: Initially admitted to step down unit. Central line placed by surgery. Treated with broad-spectrum IV antibiotics including cefepime and vancomycin. Hydrated with IV fluids. Shock has resolved. Will transition to oral levofloxacin and complete total 10 days treatment. Unfortunately no blood or urine cultures were sent during admission. 2. Healthcare associated pneumonia versus aspiration pneumonia: Management as above. Continue dysphagia 1 diet. Urine Legionella and pneumococcal antigen negative. 3. Atrial fibrillation with RVR: Cardiology was consulted. This was thought to be secondary to septic shock. Troponin was elevated-attributed to demand ischemia. TSH normal. Patient was loaded with digoxin and reverted to sinus rhythm. Patient is not an anticoagulation candidate secondary to frail status and bleeding risk. 4. NSTEMI, Type 2: Attributed to demand ischemia from septic shock. Cardiology following. Peak troponin 4.93. 2-D echo shows severely reduced LV systolic function with EF 20-25%. Not a good candidate for antiplatelet therapy given admission for hematemesis. Continue Toprol-XL and low dose ACE inhibitor. 5. Hematemesis/GI bleed: GI consulted and performed EGD  on 2/15 which did not demonstrate a bleeding lesion. Attributed to Mallory-Weiss tear. Continue PPI. 6. Acute blood loss anemia/thrombocytopenia: Status post 1 unit PRBCs. Stable 7. Acute renal failure with hyperkalemia: Had resolved but creatinine starting to creep up-possibly from dehydration. IV fluids and follow BMP 8. Non-anion gap metabolic acidosis: The patient came in with bicarbonate of 19 but since then her bicarbonate has dropped to the 14-15 range. Mild hypernatremia and hyperchloremia? Intravascular volume depletion. Anion gap: 16. Brief IV fluids/D5W and follow BMP. 9. History of dementia: Pleasantly confused. 10. Failure to thrive: Continue nutritional supplements. PT and OT evaluation. 11. Dysphagia: Continue specialized diet as per speech therapy recommendations.   Code Status: DO NOT RESUSCITATE Family Communication: None at bedside Disposition Plan: SNF when medically stable    Consultants:  Cardiology  Gastroenterology  Procedures:  Right femoral CVL 2/15 >  EGD 08/22/13: Impression:  Small sliding hiatal hernia without evidence of erosive esophagitis.  No evidence of peptic ulcer disease.  Large post bulbar duodenal diverticulum without mucosal ulceration.  Incidental finding of small duodenal lipoma.  Comment;  No bleeding lesion identified.  She possibly had Mallory-Weiss tear.   Antibiotics:  IV cefepime 2/11 > 2/18  IV vancomycin 2/11 > 2/18  Oral levofloxacin 2/18 >   Subjective: "I don't feel well"-unable to elaborate. Denies dyspnea, cough or pain.  Objective: Filed Vitals:   08/24/13 1920 08/24/13 2232 08/25/13 0651 08/25/13 0658  BP:  117/76 129/84   Pulse:  76 79   Temp:  98.1 F (36.7 C) 97.5 F (36.4 C)   TempSrc:  Oral Oral   Resp:  21 19   Height:      Weight:      SpO2: 99% 99% 95% 100%    Intake/Output Summary (Last 24 hours) at 08/25/13 1251 Last data filed at 08/25/13 0254  Gross  per 24 hour  Intake     90 ml    Output      0 ml  Net     90 ml   Filed Weights   08/19/13 0500 08/20/13 0500 08/21/13 0600  Weight: 67.5 kg (148 lb 13 oz) 66.7 kg (147 lb 0.8 oz) 68.7 kg (151 lb 7.3 oz)     Exam:  General exam: Elderly, frail, cachectic and chronically ill looking female sitting propped up in bed in no obvious distress. Respiratory system: Reduced breath sounds bibasally with few basal crackles. Rest of lung fields clear to auscultation. No increased work of breathing. Cardiovascular system: S1 & S2 heard, RRR. No JVD, murmurs, gallops, clicks. 1+ bilateral leg edema. Telemetry: Sinus rhythm. Gastrointestinal system: Abdomen is nondistended, soft and nontender. Normal bowel sounds heard. Central nervous system: Alert and oriented only to self. No focal neurological deficits. Extremities: Symmetric 5 x 5 power.    Data Reviewed: Basic Metabolic Panel:  Recent Labs Lab 08/20/13 0650 08/21/13 0500 08/23/13 0232 08/24/13 0453 08/25/13 0458  NA 142 142 143 145 148*  K 5.1 4.6 4.4 4.3 4.2  CL 113* 114* 113* 114* 117*  CO2 14* 14* 15* 14* 15*  GLUCOSE 66* 69* 99 124* 102*  BUN 33* 27* 20 30* 42*  CREATININE 0.94 0.81 0.72 0.96 1.10  CALCIUM 8.1* 8.3* 8.4 8.6 8.7   Liver Function Tests:  Recent Labs Lab 08/21/13 0500  AST 18  ALT 8  ALKPHOS 63  BILITOT 0.3  PROT 5.2*  ALBUMIN 1.9*   No results found for this basename: LIPASE, AMYLASE,  in the last 168 hours No results found for this basename: AMMONIA,  in the last 168 hours CBC:  Recent Labs Lab 08/19/13 0423  08/20/13 0650 08/21/13 0500 08/22/13 0459 08/23/13 0232 08/24/13 0453 08/25/13 0458  WBC 16.2*  --  13.6* 9.7  --   --  11.3* 12.6*  HGB 10.1*  < > 9.1* 10.1* 10.2* 9.7* 10.5* 11.4*  HCT 30.4*  < > 27.6* 31.6* 30.2* 28.5* 31.8* 34.3*  MCV 101.0*  --  103.8* 101.9*  --   --  98.5 97.7  PLT 138*  --  125* 112*  --   --  141* 141*  < > = values in this interval not displayed. Cardiac Enzymes:  Recent Labs Lab  08/22/13 2050 08/23/13 0232 08/23/13 0812 08/24/13 0453  TROPONINI 2.44* 3.26* 3.42* 4.93*   BNP (last 3 results) No results found for this basename: PROBNP,  in the last 8760 hours CBG:  Recent Labs Lab 08/21/13 1403  GLUCAP 87    Recent Results (from the past 240 hour(s))  MRSA PCR SCREENING     Status: None   Collection Time    08/18/13  6:00 PM      Result Value Ref Range Status   MRSA by PCR NEGATIVE  NEGATIVE Final   Comment:            The GeneXpert MRSA Assay (FDA     approved for NASAL specimens     only), is one component of a     comprehensive MRSA colonization     surveillance program. It is not     intended to diagnose MRSA     infection nor to guide or     monitor treatment for     MRSA infections.      Additional labs: 1. Fasting lipids: Cholesterol 178, triglycerides 157, HDL 38, LDL 109 and VLDL  31 2. TSH: 1.185 3. Influenza panel PCR: Negative 4. Urine Legionella and pneumococcal antigen: Negative 5. 2-D echo 08/23/13: Study Conclusions  - Procedure narrative: Transthoracic echocardiography. Image quality was suboptimal. The study was technically difficult, as a result of poor patient compliance and restricted patient mobility. - Left ventricle: The cavity size was normal. Wall thickness was increased in a pattern of moderate LVH. Systolic function was severely reduced. The estimated ejection fraction was in the range of 20% to 25%. Doppler parameters are consistent with abnormal left ventricular relaxation (grade 1 diastolic dysfunction). Doppler parameters are consistent with high ventricular filling pressure. - Regional wall motion abnormality: Akinesis of the mid-apical anterior, mid anteroseptal, apical inferior, apical septal, apical lateral, and apical myocardium; severe hypokinesis of the mid anterolateral myocardium; moderate hypokinesis of the mid inferoseptal and mid inferolateral myocardium. - Aortic valve: Mildly calcified  annulus. Mildly thickened leaflets. Trivial regurgitation. - Mitral valve: Moderate mitral annular calcification. Mildly thickened leaflets . Mild regurgitation. - Left atrium: The atrium was mildly dilated. - Right ventricle: Systolic function was mildly reduced. - Tricuspid valve: Mild centrally directed regurgitation. - Pulmonary arteries: PA peak pressure: 82mm Hg (S). Mildly elevated pulmonary pressures. - Inferior vena cava: The vessel was dilated; the respirophasic diameter changes were blunted (< 50%); findings are consistent with elevated central venous pressure.       Studies: No results found.      Scheduled Meds: . antiseptic oral rinse  15 mL Mouth Rinse TID AC & HS  . atorvastatin  40 mg Oral q1800  . ceFEPime (MAXIPIME) IV  1 g Intravenous Q24H  . diclofenac sodium  2 g Topical BID  . feeding supplement (ENSURE COMPLETE)  237 mL Oral BID BM  . feeding supplement (PRO-STAT SUGAR FREE 64)  30 mL Oral TID WC  . levalbuterol  0.63 mg Nebulization TID  . lisinopril  2.5 mg Oral Daily  . metoprolol succinate  25 mg Oral Daily  . pantoprazole  40 mg Oral Daily  . sodium chloride  10-40 mL Intracatheter Q12H  . vancomycin  500 mg Intravenous Q12H   Continuous Infusions:   Principal Problem:   Septic shock Active Problems:   Atrial fibrillation with RVR   Upper GI bleed   Hematemesis   AKI (acute kidney injury)   Hyperkalemia   Elevated troponin   Protein-calorie malnutrition, severe   NSTEMI (non-ST elevated myocardial infarction)   Chronic systolic heart failure    Time spent: 28 minutes    Glorianna Gott, MD, FACP, FHM. [facility] Pager [phone]  If 7PM-7AM, please contact night-coverage [email or web address] Password [facility] 08/25/2013, 12:51 PM    LOS: 7 days

## 2013-08-26 DIAGNOSIS — E870 Hyperosmolality and hypernatremia: Secondary | ICD-10-CM

## 2013-08-26 LAB — BASIC METABOLIC PANEL
BUN: 46 mg/dL — AB (ref 6–23)
CHLORIDE: 116 meq/L — AB (ref 96–112)
CO2: 16 meq/L — AB (ref 19–32)
Calcium: 8.2 mg/dL — ABNORMAL LOW (ref 8.4–10.5)
Creatinine, Ser: 1.1 mg/dL (ref 0.50–1.10)
GFR calc Af Amer: 51 mL/min — ABNORMAL LOW (ref >90)
GFR, EST NON AFRICAN AMERICAN: 44 mL/min — AB (ref >90)
GLUCOSE: 110 mg/dL — AB (ref 70–99)
POTASSIUM: 4 meq/L (ref 3.7–5.3)
SODIUM: 143 meq/L (ref 137–147)

## 2013-08-26 MED ORDER — DEXTROSE 5 % IV SOLN
INTRAVENOUS | Status: AC
  Administered 2013-08-26: 19:00:00 via INTRAVENOUS

## 2013-08-26 MED ORDER — ALPRAZOLAM 0.25 MG PO TABS
0.2500 mg | ORAL_TABLET | Freq: Two times a day (BID) | ORAL | Status: DC | PRN
  Administered 2013-08-26 – 2013-08-28 (×3): 0.25 mg via ORAL
  Filled 2013-08-26 (×3): qty 1

## 2013-08-26 NOTE — Progress Notes (Signed)
08/26/13 1834 Patient assisted up to chair with lift and nurse tech assist. Patient tolerated well, stated "i'm comfortable, want to sit up". Repositioned in chair for comfort, chair alarm on for safety. Call light within reach. Donavan Foil, RN

## 2013-08-26 NOTE — Progress Notes (Signed)
PT Cancellation Note  Patient Details Name: [name] MRN: [id number] DOB: [birth year]   Cancelled Treatment:    Reason Eval/Treat Not Completed: PT screened, no needs identified, will sign off Patient requres full lift to transfer from bed to chair. Nursing notified. Patient at previous level of function, not appropriate for PT.   Milli Woolridge R 08/26/2013, 11:41 AM

## 2013-08-26 NOTE — Progress Notes (Signed)
2/119/15 1345 Late entry for 1210. Patient crying and stated "please call my son, i want to talk to him". Contact information listed for sons Yazmin Locher and [employer]. Pt stated "it doesn't matter which one". Notified Adela Glimpse, son since he was first contact listed. Stated unable to visit today, but spoke with patient via phone with nurse at bedside. Pt less anxious, resting after speaking with son. Donavan Foil, RN

## 2013-08-26 NOTE — Progress Notes (Signed)
08/26/13 3953 Patient crying and moaning on assessment this morning, stated "hurt all over", anxious. Given Vicodin 1 tablet as ordered PRN for pain, not yet time for morphine since last dose given. Repositioned for comfort, provided emotional support. Notified Dr. Algis Liming of patient pain/anxiety this morning while on rounds. Pt appeared comfortable after pain medication, was resting. Donavan Foil, RN

## 2013-08-26 NOTE — Progress Notes (Signed)
03/26/14 1358 Patient's son, Shaketha Jeon at bedside. Requested to speak with MD for update regarding plan of care. Text-paged Dr. Algis Liming to notify of request. Donavan Foil, RN

## 2013-08-26 NOTE — Progress Notes (Signed)
08/26/13 1759 Patient anxious this evening, repeatedly calling out. Denies pain or discomfort on assessment. Emotional support provided. Patient noted to have xanax on home medications twice daily for anxiety. Notified Dr. Algis Liming. Order received for xanax 0.25 mg po BID PRN for anxiety. Nursing to monitor. Donavan Foil, RN

## 2013-08-26 NOTE — Progress Notes (Signed)
Patient ID: [name], female   DOB: 07-29-1925, 80 y.o.   MRN: [id number]     Subjective:    Denies any pain, no SOB  Objective:   Temp:  [97.8 F (36.6 C)-98.1 F (36.7 C)] 97.8 F (36.6 C) (02/19 0459) Pulse Rate:  [67-94] 67 (02/19 0459) Resp:  [20] 20 (02/19 0459) BP: (116-124)/(73-79) 124/79 mmHg (02/19 0459) SpO2:  [97 %-100 %] 97 % (02/19 0656) Last BM Date: 08/25/13  Filed Weights   08/19/13 0500 08/20/13 0500 08/21/13 0600  Weight: 148 lb 13 oz (67.5 kg) 147 lb 0.8 oz (66.7 kg) 151 lb 7.3 oz (68.7 kg)    Intake/Output Summary (Last 24 hours) at 08/26/13 1023 Last data filed at 08/26/13 0804  Gross per 24 hour  Intake 423.75 ml  Output      0 ml  Net 423.75 ml    Telemetry: sinus rhythm, short runs of atach  Exam:  General:NAD  Resp: Clear anteriorally  Cardiac: RRR, no m/r/g, no JVD, no carotid bruits  BD:ZHGDJME soft, NT, ND  MSK: extremites are warm, no edema    Lab Results:  Basic Metabolic Panel:  Recent Labs Lab 08/24/13 0453 08/25/13 0458 08/26/13 0712  NA 145 148* 143  K 4.3 4.2 4.0  CL 114* 117* 116*  CO2 14* 15* 16*  GLUCOSE 124* 102* 110*  BUN 30* 42* 46*  CREATININE 0.96 1.10 1.10  CALCIUM 8.6 8.7 8.2*    Liver Function Tests:  Recent Labs Lab 08/21/13 0500  AST 18  ALT 8  ALKPHOS 63  BILITOT 0.3  PROT 5.2*  ALBUMIN 1.9*    CBC:  Recent Labs Lab 08/21/13 0500  08/23/13 0232 08/24/13 0453 08/25/13 0458  WBC 9.7  --   --  11.3* 12.6*  HGB 10.1*  < > 9.7* 10.5* 11.4*  HCT 31.6*  < > 28.5* 31.8* 34.3*  MCV 101.9*  --   --  98.5 97.7  PLT 112*  --   --  141* 141*  < > = values in this interval not displayed.  Cardiac Enzymes:  Recent Labs Lab 08/23/13 0232 08/23/13 0812 08/24/13 0453  TROPONINI 3.26* 3.42* 4.93*    BNP: No results found for this basename: PROBNP,  in the last 8760 hours  Coagulation: No results found for this basename: INR,  in the last 168 hours  ECG:   Medications:    Scheduled Medications: . antiseptic oral rinse  15 mL Mouth Rinse TID AC & HS  . atorvastatin  40 mg Oral q1800  . diclofenac sodium  2 g Topical BID  . feeding supplement (ENSURE COMPLETE)  237 mL Oral BID BM  . feeding supplement (PRO-STAT SUGAR FREE 64)  30 mL Oral TID WC  . levalbuterol  0.63 mg Nebulization TID  . levofloxacin  750 mg Oral Q48H  . lisinopril  2.5 mg Oral Daily  . metoprolol succinate  25 mg Oral Daily  . pantoprazole  40 mg Oral Daily  . sodium chloride  10-40 mL Intracatheter Q12H     Infusions: . dextrose       PRN Medications:  acetaminophen, acetaminophen, guaiFENesin-dextromethorphan, HYDROcodone-acetaminophen, levalbuterol, morphine injection, ondansetron (ZOFRAN) IV, sodium chloride     Assessment/Plan    1. NSTEMI  - likely demand ischemia, managed conservatively given her multiple medical comorbidities  - not candidate for DAPT or anticoag given GI bleeds  - continue medical therapy  2.Atrial fib with RVR:  - on low dose Toprol  XL with good rate control  - previously deemed poor candidate for anticoag given age, fraility, and GI bleed   3. Systolic heart failure  - LVEF 20-25% by most recent echo, appears to be a new finding.  - continue medical therapy, given comorbidities and overall health status defer any ischemic evaluation at this time  - started low dose ACE-I yesterday, overall renal function remains stable  - on my evaluation she appears to be hypovolemic at this time. Her documented oral intake is minimal, likely due to her mental status.  Her BUN, Cr are trending up as well as the cell lines in her CBC to suggests she is becoming hemoconcentrated. Agree with gentle hydration        Carlyle Dolly, M.D., F.A.C.C.

## 2013-08-26 NOTE — Progress Notes (Signed)
Speech Language Pathology Treatment: Dysphagia  Patient Details Name: LEKISHA MCGHEE MRN: [id number] DOB: 26-Feb-1926 Today's Date: 08/26/2013 Time: 1035-1050 SLP Time Calculation (min): 15 min  Assessment / Plan / Recommendation Clinical Impression  Ms. Rider was more agreeable to po trials today. Chart review reveals that pt typically consumes some of one meal per day and eats very little otherwise. She was assessed with trials of nectar-thick liquid via teaspoon presentation. Pt required verbal cues for lip closure around spoon, hard swallow, and repeat swallows. Pt with delayed and immediate coughing after nectars. Pt with seemingly improved control with honey-thick by teaspoon, however she continues to be at risk for aspiration due to weakness and altered mental status (pt slurring her words possible due to medication effect).    HPI HPI: Ms. Moeser is an 80 y.o.female with known history of PAF (not on anticoagulation), severe hypothyroidism, last seen by Dr. Mare Ferrari in 2013 for pre-operative evaluation for right hip revision. She presented to the ER from [name] with hematemesis, hypotension, and atrial fibrillation with RVR, rates in the 120's. Due to hypotension, (BP 77/47) she was not started on Cardizem. She was found to be anemic with Hgb 9.2. FOBT was found to be positive.She was found to be hyperkalemic with potassium of 5.0.  She is being treated for sepsis with IV fluids and antibiotics.    Pertinent Vitals   SLP Plan  Continue with current plan of care    Recommendations Diet recommendations: Dysphagia 1 (puree);Honey-thick liquid Liquids provided via: Teaspoon Medication Administration: Crushed with puree Supervision: Staff to assist with self feeding;Full supervision/cueing for compensatory strategies Compensations: Slow rate;Small sips/bites;Multiple dry swallows after each bite/sip;Clear throat intermittently Postural Changes and/or Swallow Maneuvers: Seated upright 90  degrees;Out of bed for meals;Upright 30-60 min after meal              Oral Care Recommendations: Oral care Q4 per protocol;Staff/trained caregiver to provide oral care Follow up Recommendations: 24 hour supervision/assistance Plan: Continue with current plan of care    GO    Reviewed and in agreement with treatment provided  Thank you,  Genene Churn, [place]   [place] 08/26/2013, 1:58 PM

## 2013-08-26 NOTE — Progress Notes (Signed)
08/26/13 1620 Patient noted to be moaning this afternoon at 1545. Offered Vicodin tablet as ordered PRN for pain. Patient refused, stated "why, I'm not hurting". When asked if uncomfortable and why moaning, stated "i don't know, I'm okay". Repositioned for comfort. Nursing to monitor. Donavan Foil, RN

## 2013-08-26 NOTE — Progress Notes (Signed)
PROGRESS NOTE    [name] [id number] DOB: 04/19/1926 DOA: 08/18/2013 PCP: Marissa Emperor, NP Primary cardiologist: Dr. [name]  HPI/Brief narrative 80 year old female with history of dementia, failure to thrive, PAF, hypothyroid, inclusion body myositis, anxiety and dysphagia was admitted on 08/18/13 with hematemesis, presumed septic shock secondary to HCAP, acute blood loss anemia, acute renal failure with hyperkalemia and A. fib with RVR. She was treated with IV fluids, broad-spectrum IV antibiotics, received 1 unit of PRBCs, PPIs, loaded with digoxin and reverted to sinus rhythm. After stabilization, she was transferred to telemetry floor.  Assessment/Plan:  1. Septic shock secondary to healthcare associated pneumonia: Initially admitted to step down unit. Central line placed by surgery. Treated with broad-spectrum IV antibiotics including cefepime and vancomycin. Hydrated with IV fluids. Shock has resolved. Transitioned to oral levofloxacin on 2/18 and complete total 10 days treatment. Unfortunately no blood or urine cultures were sent during admission. 2. Healthcare associated pneumonia versus aspiration pneumonia: Management as above. Continue dysphagia 1 diet. Urine Legionella and pneumococcal antigen negative. 3. Atrial fibrillation with RVR: Cardiology was consulted. This was thought to be secondary to septic shock. Troponin was elevated-attributed to demand ischemia. TSH normal. Patient was loaded with digoxin and reverted to sinus rhythm. Patient is not an anticoagulation candidate secondary to frail status and bleeding risk. 4. NSTEMI, Type 2: Attributed to demand ischemia from septic shock. Cardiology following. Peak troponin 4.93. 2-D echo shows severely reduced LV systolic function with EF 20-25%. Not a good candidate for antiplatelet therapy given admission for hematemesis. Continue Toprol-XL and low dose ACE inhibitor. 5. Hematemesis/GI bleed: GI consulted and  performed EGD on 2/15 which did not demonstrate a bleeding lesion. Attributed to Mallory-Weiss tear. Continue PPI. 6. Acute blood loss anemia/thrombocytopenia: Status post 1 unit PRBCs. Stable 7. Acute renal failure with hyperkalemia: Had resolved but creatinine had starting to creep up-possibly from dehydration. IV fluids and follow BMP-improving 8. Non-anion gap metabolic acidosis/ hypernatremia: The patient came in with bicarbonate of 19 but since then her bicarbonate has dropped to the 14-15 range. Mild hypernatremia and hyperchloremia? Intravascular volume depletion. Anion gap: 16. Brief IV fluids/D5W and follow BMP-slightly better compared to yesterday. Continue hypotonic IV fluids. 9. History of dementia: Pleasantly confused. 10. Failure to thrive: Continue nutritional supplements. PT and OT evaluation. Discussed extensively with patient's youngest son Mr. [name], updated care, answered questions-he states that patient has been bedbound at the nursing facility for the last couple of months, history of "mild dementia" but mental status worse in the hospital, patient declining to eat, morning intermittently but denies pain or dyspnea. Advised him that she has progressive failure to thrive from advanced age, dementia, several significant comorbidities and overall long-term prognosis is poor and family might want to discuss and consider more palliative oriented care. He was appreciative of the information and would discuss with his family. 11. Dysphagia: Continue specialized diet as per speech therapy recommendations.   Code Status: DO NOT RESUSCITATE Family Communication: Discussed with son Mr. [name] on 2/19 Disposition Plan: SNF when medically stable    Consultants:  Cardiology  Gastroenterology  Procedures:  Right femoral CVL 2/15 >  EGD 08/22/13: Impression:  Small sliding hiatal hernia without evidence of erosive esophagitis.  No evidence of peptic ulcer disease.    Large post bulbar duodenal diverticulum without mucosal ulceration.  Incidental finding of small duodenal lipoma.  Comment;  No bleeding lesion identified.  She possibly had Mallory-Weiss tear.   Antibiotics:  IV cefepime  2/11 > 2/18  IV vancomycin 2/11 > 2/18  Oral levofloxacin 2/18 >   Subjective: Keeps moaning but denies pain or dyspnea. However as per son, patient complained of generalized pains yesterday.  Objective: Filed Vitals:   08/26/13 0459 08/26/13 0656 08/26/13 1347 08/26/13 1400  BP: 124/79   121/55  Pulse: 67   61  Temp: 97.8 F (36.6 C)   98 F (36.7 C)  TempSrc: Oral     Resp: 20   20  Height:      Weight:      SpO2: 100% 97% 98% 96%    Intake/Output Summary (Last 24 hours) at 08/26/13 1711 Last data filed at 08/26/13 1300  Gross per 24 hour  Intake 483.75 ml  Output      0 ml  Net 483.75 ml   Filed Weights   08/19/13 0500 08/20/13 0500 08/21/13 0600  Weight: 67.5 kg (148 lb 13 oz) 66.7 kg (147 lb 0.8 oz) 68.7 kg (151 lb 7.3 oz)     Exam:  General exam: Elderly, frail, cachectic and chronically ill looking female lying in bed and moaning intermittently. Respiratory system: Reduced breath sounds bibasally with few basal crackles. Rest of lung fields clear to auscultation. No increased work of breathing. Cardiovascular system: S1 & S2 heard, RRR. No JVD, murmurs, gallops, clicks. 1+ bilateral leg edema. Telemetry: Sinus rhythm. Gastrointestinal system: Abdomen is nondistended, soft and nontender. Normal bowel sounds heard. Central nervous system: Alert and oriented only to self. No focal neurological deficits. Extremities: Symmetric 5 x 5 power.    Data Reviewed: Basic Metabolic Panel:  Recent Labs Lab 08/21/13 0500 08/23/13 0232 08/24/13 0453 08/25/13 0458 08/26/13 0712  NA 142 143 145 148* 143  K 4.6 4.4 4.3 4.2 4.0  CL 114* 113* 114* 117* 116*  CO2 14* 15* 14* 15* 16*  GLUCOSE 69* 99 124* 102* 110*  BUN 27* 20 30* 42* 46*   CREATININE 0.81 0.72 0.96 1.10 1.10  CALCIUM 8.3* 8.4 8.6 8.7 8.2*   Liver Function Tests:  Recent Labs Lab 08/21/13 0500  AST 18  ALT 8  ALKPHOS 63  BILITOT 0.3  PROT 5.2*  ALBUMIN 1.9*   No results found for this basename: LIPASE, AMYLASE,  in the last 168 hours No results found for this basename: AMMONIA,  in the last 168 hours CBC:  Recent Labs Lab 08/20/13 0650 08/21/13 0500 08/22/13 0459 08/23/13 0232 08/24/13 0453 08/25/13 0458  WBC 13.6* 9.7  --   --  11.3* 12.6*  HGB 9.1* 10.1* 10.2* 9.7* 10.5* 11.4*  HCT 27.6* 31.6* 30.2* 28.5* 31.8* 34.3*  MCV 103.8* 101.9*  --   --  98.5 97.7  PLT 125* 112*  --   --  141* 141*   Cardiac Enzymes:  Recent Labs Lab 08/22/13 2050 08/23/13 0232 08/23/13 0812 08/24/13 0453  TROPONINI 2.44* 3.26* 3.42* 4.93*   BNP (last 3 results) No results found for this basename: PROBNP,  in the last 8760 hours CBG:  Recent Labs Lab 08/21/13 1403  GLUCAP 87    Recent Results (from the past 240 hour(s))  MRSA PCR SCREENING     Status: None   Collection Time    08/18/13  6:00 PM      Result Value Ref Range Status   MRSA by PCR NEGATIVE  NEGATIVE Final   Comment:            The GeneXpert MRSA Assay (FDA     approved for NASAL  specimens     only), is one component of a     comprehensive MRSA colonization     surveillance program. It is not     intended to diagnose MRSA     infection nor to guide or     monitor treatment for     MRSA infections.      Additional labs: 1. Fasting lipids: Cholesterol 178, triglycerides 157, HDL 38, LDL 109 and VLDL 31 2. TSH: 1.185 3. Influenza panel PCR: Negative 4. Urine Legionella and pneumococcal antigen: Negative 5. 2-D echo 08/23/13: Study Conclusions  - Procedure narrative: Transthoracic echocardiography. Image quality was suboptimal. The study was technically difficult, as a result of poor patient compliance and restricted patient mobility. - Left ventricle: The cavity size  was normal. Wall thickness was increased in a pattern of moderate LVH. Systolic function was severely reduced. The estimated ejection fraction was in the range of 20% to 25%. Doppler parameters are consistent with abnormal left ventricular relaxation (grade 1 diastolic dysfunction). Doppler parameters are consistent with high ventricular filling pressure. - Regional wall motion abnormality: Akinesis of the mid-apical anterior, mid anteroseptal, apical inferior, apical septal, apical lateral, and apical myocardium; severe hypokinesis of the mid anterolateral myocardium; moderate hypokinesis of the mid inferoseptal and mid inferolateral myocardium. - Aortic valve: Mildly calcified annulus. Mildly thickened leaflets. Trivial regurgitation. - Mitral valve: Moderate mitral annular calcification. Mildly thickened leaflets . Mild regurgitation. - Left atrium: The atrium was mildly dilated. - Right ventricle: Systolic function was mildly reduced. - Tricuspid valve: Mild centrally directed regurgitation. - Pulmonary arteries: PA peak pressure: 42mm Hg (S). Mildly elevated pulmonary pressures. - Inferior vena cava: The vessel was dilated; the respirophasic diameter changes were blunted (< 50%); findings are consistent with elevated central venous pressure.       Studies: No results found.      Scheduled Meds: . antiseptic oral rinse  15 mL Mouth Rinse TID AC & HS  . atorvastatin  40 mg Oral q1800  . diclofenac sodium  2 g Topical BID  . feeding supplement (ENSURE COMPLETE)  237 mL Oral BID BM  . feeding supplement (PRO-STAT SUGAR FREE 64)  30 mL Oral TID WC  . levalbuterol  0.63 mg Nebulization TID  . levofloxacin  750 mg Oral Q48H  . lisinopril  2.5 mg Oral Daily  . metoprolol succinate  25 mg Oral Daily  . pantoprazole  40 mg Oral Daily  . sodium chloride  10-40 mL Intracatheter Q12H   Continuous Infusions: . dextrose 60 mL/hr at 08/26/13 1040    Principal Problem:    Septic shock Active Problems:   Atrial fibrillation with RVR   Upper GI bleed   Hematemesis   AKI (acute kidney injury)   Hyperkalemia   Elevated troponin   Protein-calorie malnutrition, severe   NSTEMI (non-ST elevated myocardial infarction)   Chronic systolic heart failure    Time spent: 30 minutes    Deem Marmol, MD, FACP, FHM. [facility] Pager [phone]  If 7PM-7AM, please contact night-coverage [email or web address] Password [email or web address] 08/26/2013, 5:11 PM    LOS: 8 days

## 2013-08-27 ENCOUNTER — Encounter (HOSPITAL_COMMUNITY): Payer: Medicare Other

## 2013-08-27 MED ORDER — SODIUM CHLORIDE 0.9 % IJ SOLN: 10.0000 mL | INTRAMUSCULAR | Status: DC | PRN

## 2013-08-27 MED ORDER — ALTEPLASE 2 MG IJ SOLR
2.0000 mg | Freq: Once | INTRAMUSCULAR | Status: AC
  Administered 2013-08-27: 2 mg
  Filled 2013-08-27: qty 2

## 2013-08-27 MED ORDER — ALTEPLASE 2 MG IJ SOLR
INTRAMUSCULAR | Status: AC
  Filled 2013-08-27: qty 4

## 2013-08-27 MED ORDER — MICONAZOLE NITRATE 2 % VA CREA
TOPICAL_CREAM | VAGINAL | Status: AC
  Filled 2013-08-27: qty 45

## 2013-08-27 MED ORDER — SODIUM CHLORIDE 0.9 % IJ SOLN
10.0000 mL | Freq: Two times a day (BID) | INTRAMUSCULAR | Status: DC
  Administered 2013-08-27 – 2013-08-28 (×2): 30 mL
  Administered 2013-08-28: 10 mL
  Administered 2013-08-29: 30 mL

## 2013-08-27 MED ORDER — DEXTROSE 5 % IV SOLN
INTRAVENOUS | Status: DC
  Administered 2013-08-27: 60 mL via INTRAVENOUS

## 2013-08-27 MED ORDER — MICONAZOLE NITRATE 2 % EX CREA: TOPICAL_CREAM | Freq: Two times a day (BID) | CUTANEOUS | Status: DC

## 2013-08-27 MED ORDER — ALTEPLASE 2 MG IJ SOLR
2.0000 mg | Freq: Once | INTRAMUSCULAR | Status: DC
  Filled 2013-08-27: qty 2

## 2013-08-27 MED ORDER — MICONAZOLE NITRATE 2 % EX CREA
TOPICAL_CREAM | Freq: Two times a day (BID) | CUTANEOUS | Status: DC
  Administered 2013-08-27: 20:00:00 via TOPICAL
  Filled 2013-08-27: qty 14

## 2013-08-27 NOTE — Progress Notes (Signed)
Spoke with cicely alston rn regarding TPA  Flush stated she and lisa covington rn would take care of it.

## 2013-08-27 NOTE — Progress Notes (Signed)
NUTRITION FOLLOW UP  Intervention:   Continue with current POC  Nutrition Dx:   Inadequate oral intake related to decreased appetite, increased needs due to wound healing as evidenced by PO: 10-50%, pressure ulcers; ongoing  Goal:   Pt will meet >90% of estimated nutritional needs; goal not met  Monitor:   PO intake, skin assessments, weight changes, labs, I/O's  Assessment:   Poor appetite continues. Intake has declined from last visit (PO: 10-15%).  Pt was evaluated by SLP and was downgraded to a dyspgahia 1 diet with nectar thick liquids. Per SLP, pt remains an aspiration risk due to weakness and AMS.  Not new wt since last visit, so unable to assess for weight changes.  Pt continues on supplements Ensure Completed BID (which provides 700 kcals and 26 grams protein) and 30 ml Prostat TID (which provides 300 kcals and 45 grams protein). May need consider enteral nutrition if poor po intake continues, if family desires, due to malnutrition and prolonged poor po intake.   Height: Ht Readings from Last 1 Encounters:  08/18/13 $RemoveB'5\' 5"'[id number]$  (1.651 m)    Weight Status:   Wt Readings from Last 1 Encounters:  08/21/13 151 lb 7.3 oz (68.7 kg)    Re-estimated needs:  Kcal: 2061-2405 daily Protein: 86-103 grams daily Fluid: 2.0-2.4 L daily  Skin: unstageable pressure ulcer on lt heel, open wound on lt leg, dehisced lt elbow abrasion   Diet Order: Dysphagia   Intake/Output Summary (Last 24 hours) at 08/27/13 1359 Last data filed at 08/27/13 0900  Gross per 24 hour  Intake   1291 ml  Output      0 ml  Net   1291 ml    Last BM: 08/25/13   Labs:   Recent Labs Lab 08/24/13 0453 08/25/13 0458 08/26/13 0712  NA 145 148* 143  K 4.3 4.2 4.0  CL 114* 117* 116*  CO2 14* 15* 16*  BUN 30* 42* 46*  CREATININE 0.96 1.10 1.10  CALCIUM 8.6 8.7 8.2*  GLUCOSE 124* 102* 110*    CBG (last 3)  No results found for this basename: GLUCAP,  in the last 72 hours  Scheduled Meds: .  antiseptic oral rinse  15 mL Mouth Rinse TID AC & HS  . atorvastatin  40 mg Oral q1800  . diclofenac sodium  2 g Topical BID  . feeding supplement (ENSURE COMPLETE)  237 mL Oral BID BM  . feeding supplement (PRO-STAT SUGAR FREE 64)  30 mL Oral TID WC  . levalbuterol  0.63 mg Nebulization TID  . levofloxacin  750 mg Oral Q48H  . lisinopril  2.5 mg Oral Daily  . metoprolol succinate  25 mg Oral Daily  . pantoprazole  40 mg Oral Daily  . sodium chloride  10-40 mL Intracatheter Q12H    Continuous Infusions:   Raiya Stainback A. Jimmye Norman, RD, LDN Pager: [phone]

## 2013-08-27 NOTE — Progress Notes (Addendum)
PROGRESS NOTE    [name] YTK:[id number] DOB: July 28, 1925 DOA: 08/18/2013 PCP: [name], NP Primary cardiologist: Dr. [name]  HPI/Brief narrative 80 year old female with history of dementia, failure to thrive, PAF, hypothyroid, inclusion body myositis, anxiety and dysphagia was admitted on 08/18/13 with hematemesis, presumed septic shock secondary to HCAP, acute blood loss anemia, acute renal failure with hyperkalemia and A. fib with RVR. She was treated with IV fluids, broad-spectrum IV antibiotics, received 1 unit of PRBCs, PPIs, loaded with digoxin and reverted to sinus rhythm. After stabilization, she was transferred to telemetry floor.  Assessment/Plan:  1. Septic shock secondary to healthcare associated pneumonia: Initially admitted to step down unit. Central line placed by surgery. Treated with broad-spectrum IV antibiotics including cefepime and vancomycin. Hydrated with IV fluids. Shock has resolved. Transitioned to oral levofloxacin on 2/18 and complete total 10 days treatment. Unfortunately no blood or urine cultures were sent during admission. 2. Healthcare associated pneumonia versus aspiration pneumonia: Management as above. Continue dysphagia 1 diet. Urine Legionella and pneumococcal antigen negative. 3. Atrial fibrillation with RVR: Cardiology was consulted. This was thought to be secondary to septic shock. Troponin was elevated-attributed to demand ischemia. TSH normal. Patient was loaded with digoxin and reverted to sinus rhythm. Patient is not an anticoagulation candidate secondary to frail status and bleeding risk. 4. NSTEMI, Type 2: Attributed to demand ischemia from septic shock. Cardiology following. Peak troponin 4.93. 2-D echo shows severely reduced LV systolic function with EF 20-25%. Not a good candidate for antiplatelet therapy given admission for hematemesis. Continue Toprol-XL and low dose ACE inhibitor. 5. Hematemesis/GI bleed: GI consulted and  performed EGD on 2/15 which did not demonstrate a bleeding lesion. Attributed to Mallory-Weiss tear. Continue PPI. 6. Acute blood loss anemia/thrombocytopenia: Status post 1 unit PRBCs. Stable 7. Acute renal failure with hyperkalemia: Had resolved but creatinine had starting to creep up-possibly from dehydration. IV fluids and follow BMP-improving 8. Non-anion gap metabolic acidosis/ hypernatremia: The patient came in with bicarbonate of 19 but since then her bicarbonate had dropped to the 14-15 range. Mild hypernatremia and hyperchloremia? Intravascular volume depletion. Anion gap: 16. Started on hypotonic IV fluids since 2/18- slightly improved hypernatremia and acidosis. Today's labs pending. 9. History of dementia: Pleasantly confused. 10. Failure to thrive: Continue nutritional supplements. PT and OT evaluation. Discussed extensively with patient's youngest son Mr. [name] on 2/19, updated care, answered questions-he states that patient has been bedbound at the nursing facility for the last couple of months, history of "mild dementia" but mental status worse in the hospital, patient declining to eat, moaning intermittently but denies pain or dyspnea. Anxiety +. Advised him that she has progressive failure to thrive from advanced age, dementia, several significant comorbidities and overall long-term prognosis is poor and family might want to discuss and consider more palliative oriented care. He was appreciative of the information and would discuss with his family. Awaiting input from family. 11. Dysphagia: Continue specialized diet as per speech therapy recommendations.   Code Status: DO NOT RESUSCITATE Family Communication: Discussed with son Mr. [name] on 2/19 & Mr. [name] in [place] on 2/20 Disposition Plan: SNF when medically stable    Consultants:  Cardiology  Gastroenterology  Procedures:  Right femoral CVL 2/15 >  EGD 08/22/13:  Impression:  Small sliding hiatal  hernia without evidence of erosive esophagitis.  No evidence of peptic ulcer disease.  Large post bulbar duodenal diverticulum without mucosal ulceration.  Incidental finding of small duodenal lipoma.  Comment;  No bleeding lesion identified.  She possibly had Mallory-Weiss tear.   Antibiotics:  IV cefepime 2/11 > 2/18  IV vancomycin 2/11 > 2/18  Oral levofloxacin 2/18 >   Subjective: Patient smiled a little this morning. Not moaning. Denies complaints. Continues to deny pain or dyspnea. As per nursing, unable to draw blood from femoral vein-discussed with PICC team: Plans to place midline and remove femoral vein.  Objective: Filed Vitals:   08/26/13 1930 08/26/13 2131 08/27/13 0331 08/27/13 1159  BP:  117/55 108/72 114/74  Pulse:  52 75 94  Temp:  97.2 F (36.2 C) 97.3 F (36.3 C)   TempSrc:  Oral Oral   Resp:  20 20   Height:      Weight:      SpO2: 96% 100% 97%     Intake/Output Summary (Last 24 hours) at 08/27/13 1413 Last data filed at 08/27/13 1404  Gross per 24 hour  Intake   1321 ml  Output      0 ml  Net   1321 ml   Filed Weights   08/19/13 0500 08/20/13 0500 08/21/13 0600  Weight: 67.5 kg (148 lb 13 oz) 66.7 kg (147 lb 0.8 oz) 68.7 kg (151 lb 7.3 oz)     Exam:  General exam: Elderly, frail, cachectic and chronically ill looking female lying in bed , smiling and appears comfortable. Respiratory system: Reduced breath sounds bibasally with few basal crackles. Rest of lung fields clear to auscultation. No increased work of breathing. Cardiovascular system: S1 & S2 heard, RRR. No JVD, murmurs, gallops, clicks. 1+ bilateral leg edema.  Gastrointestinal system: Abdomen is nondistended, soft and nontender. Normal bowel sounds heard. Central nervous system: Alert and oriented only to self. No focal neurological deficits. Extremities: Symmetric 5 x 5 power.    Data Reviewed: Basic Metabolic Panel:  Recent Labs Lab 08/21/13 0500 08/23/13 0232  08/24/13 0453 08/25/13 0458 08/26/13 0712  NA 142 143 145 148* 143  K 4.6 4.4 4.3 4.2 4.0  CL 114* 113* 114* 117* 116*  CO2 14* 15* 14* 15* 16*  GLUCOSE 69* 99 124* 102* 110*  BUN 27* 20 30* 42* 46*  CREATININE 0.81 0.72 0.96 1.10 1.10  CALCIUM 8.3* 8.4 8.6 8.7 8.2*   Liver Function Tests:  Recent Labs Lab 08/21/13 0500  AST 18  ALT 8  ALKPHOS 63  BILITOT 0.3  PROT 5.2*  ALBUMIN 1.9*   No results found for this basename: LIPASE, AMYLASE,  in the last 168 hours No results found for this basename: AMMONIA,  in the last 168 hours CBC:  Recent Labs Lab 08/21/13 0500 08/22/13 0459 08/23/13 0232 08/24/13 0453 08/25/13 0458  WBC 9.7  --   --  11.3* 12.6*  HGB 10.1* 10.2* 9.7* 10.5* 11.4*  HCT 31.6* 30.2* 28.5* 31.8* 34.3*  MCV 101.9*  --   --  98.5 97.7  PLT 112*  --   --  141* 141*   Cardiac Enzymes:  Recent Labs Lab 08/22/13 2050 08/23/13 0232 08/23/13 0812 08/24/13 0453  TROPONINI 2.44* 3.26* 3.42* 4.93*   BNP (last 3 results) No results found for this basename: PROBNP,  in the last 8760 hours CBG:  Recent Labs Lab 08/21/13 1403  GLUCAP 87    Recent Results (from the past 240 hour(s))  MRSA PCR SCREENING     Status: None   Collection Time    08/18/13  6:00 PM      Result Value Ref Range Status  MRSA by PCR NEGATIVE  NEGATIVE Final   Comment:            The GeneXpert MRSA Assay (FDA     approved for NASAL specimens     only), is one component of a     comprehensive MRSA colonization     surveillance program. It is not     intended to diagnose MRSA     infection nor to guide or     monitor treatment for     MRSA infections.      Additional labs: 1. Fasting lipids: Cholesterol 178, triglycerides 157, HDL 38, LDL 109 and VLDL 31 2. TSH: 1.185 3. Influenza panel PCR: Negative 4. Urine Legionella and pneumococcal antigen: Negative 5. 2-D echo 08/23/13: Study Conclusions  - Procedure narrative: Transthoracic echocardiography. Image quality  was suboptimal. The study was technically difficult, as a result of poor patient compliance and restricted patient mobility. - Left ventricle: The cavity size was normal. Wall thickness was increased in a pattern of moderate LVH. Systolic function was severely reduced. The estimated ejection fraction was in the range of 20% to 25%. Doppler parameters are consistent with abnormal left ventricular relaxation (grade 1 diastolic dysfunction). Doppler parameters are consistent with high ventricular filling pressure. - Regional wall motion abnormality: Akinesis of the mid-apical anterior, mid anteroseptal, apical inferior, apical septal, apical lateral, and apical myocardium; severe hypokinesis of the mid anterolateral myocardium; moderate hypokinesis of the mid inferoseptal and mid inferolateral myocardium. - Aortic valve: Mildly calcified annulus. Mildly thickened leaflets. Trivial regurgitation. - Mitral valve: Moderate mitral annular calcification. Mildly thickened leaflets . Mild regurgitation. - Left atrium: The atrium was mildly dilated. - Right ventricle: Systolic function was mildly reduced. - Tricuspid valve: Mild centrally directed regurgitation. - Pulmonary arteries: PA peak pressure: 73mm Hg (S). Mildly elevated pulmonary pressures. - Inferior vena cava: The vessel was dilated; the respirophasic diameter changes were blunted (< 50%); findings are consistent with elevated central venous pressure.       Studies: No results found.      Scheduled Meds: . antiseptic oral rinse  15 mL Mouth Rinse TID AC & HS  . atorvastatin  40 mg Oral q1800  . diclofenac sodium  2 g Topical BID  . feeding supplement (ENSURE COMPLETE)  237 mL Oral BID BM  . feeding supplement (PRO-STAT SUGAR FREE 64)  30 mL Oral TID WC  . levalbuterol  0.63 mg Nebulization TID  . levofloxacin  750 mg Oral Q48H  . lisinopril  2.5 mg Oral Daily  . metoprolol succinate  25 mg Oral Daily  .  pantoprazole  40 mg Oral Daily  . sodium chloride  10-40 mL Intracatheter Q12H   Continuous Infusions:    Principal Problem:   Septic shock Active Problems:   Atrial fibrillation with RVR   Upper GI bleed   Hematemesis   AKI (acute kidney injury)   Hyperkalemia   Elevated troponin   Protein-calorie malnutrition, severe   NSTEMI (non-ST elevated myocardial infarction)   Chronic systolic heart failure    Time spent: 20 minutes    Lety Cullens, MD, FACP, FHM. [facility] Pager [phone]  If 7PM-7AM, please contact night-coverage [email or web address] Password [email or web address] 08/27/2013, 2:13 PM    LOS: 9 days

## 2013-08-27 NOTE — Clinical Social Work Note (Signed)
CSW attempted to call children to touch base, but no answer and no voicemail set up. Updated [name] on pt. Agreeable to return over weekend if stable. CSW to continue to follow.    [name], [place]

## 2013-08-27 NOTE — Progress Notes (Signed)
Late Entry:   Discussed with intially Charm Barges of the IV team the fact that the MD wanted to inquire them to check her femoral line due to the patient being a hard IV/blood lab draw.  The tech this am attempted several times to draw the labs but was unable.  Barnett Applebaum voiced to me that she was going to try to place a Midline which would be okay for blood draws.  Roselind Messier also assessed the the patients IV site and stated that she was unable to get a new IV, so she worked the one that was already in place.  She stated that a PICC line would be placed on Monday.  She also stated that she had talked to the head nurse at [place] with the IV team and stated that she would be placing an order in for TPA to declotted the femoral line.  [place] supervisor administered the medication stated she would come and check the site at [phone] and if no blood return send Tim at 2015.  Called time at 2023 to follow up about the femoral line and he stated that he would come up and check the site.  Voiced this to [facility] the oncoming nurse.

## 2013-08-27 NOTE — Progress Notes (Signed)
Pt refused nebs will give as much as she will allow, will monitor

## 2013-08-27 NOTE — Progress Notes (Signed)
Patient ID: [name], female   DOB: 08/02/1925, 80 y.o.   MRN: [id number]     Subjective:    No complaints overnight.   Objective:   Temp:  [97.2 F (36.2 C)-98 F (36.7 C)] 97.3 F (36.3 C) (02/20 0331) Pulse Rate:  [52-94] 94 (02/20 1159) Resp:  [20] 20 (02/20 0331) BP: (108-121)/(55-74) 114/74 mmHg (02/20 1159) SpO2:  [96 %-100 %] 97 % (02/20 0331) Last BM Date: 08/25/13  Filed Weights   08/19/13 0500 08/20/13 0500 08/21/13 0600  Weight: 148 lb 13 oz (67.5 kg) 147 lb 0.8 oz (66.7 kg) 151 lb 7.3 oz (68.7 kg)    Intake/Output Summary (Last 24 hours) at 08/27/13 1326 Last data filed at 08/27/13 0900  Gross per 24 hour  Intake   1291 ml  Output      0 ml  Net   1291 ml    Exam:  General: NAD  Resp: CTAB  Cardiac: irreg, no m/r/g, no JVD  GI: abdomen soft, NT, ND  HCW:[id number] are warm, no edema  Neuro: no focal deficits    Lab Results:  Basic Metabolic Panel:  Recent Labs Lab 08/24/13 0453 08/25/13 0458 08/26/13 0712  NA 145 148* 143  K 4.3 4.2 4.0  CL 114* 117* 116*  CO2 14* 15* 16*  GLUCOSE 124* 102* 110*  BUN 30* 42* 46*  CREATININE 0.96 1.10 1.10  CALCIUM 8.6 8.7 8.2*    Liver Function Tests:  Recent Labs Lab 08/21/13 0500  AST 18  ALT 8  ALKPHOS 63  BILITOT 0.3  PROT 5.2*  ALBUMIN 1.9*    CBC:  Recent Labs Lab 08/21/13 0500  08/23/13 0232 08/24/13 0453 08/25/13 0458  WBC 9.7  --   --  11.3* 12.6*  HGB 10.1*  < > 9.7* 10.5* 11.4*  HCT 31.6*  < > 28.5* 31.8* 34.3*  MCV 101.9*  --   --  98.5 97.7  PLT 112*  --   --  141* 141*  < > = values in this interval not displayed.  Cardiac Enzymes:  Recent Labs Lab 08/23/13 0232 08/23/13 0812 08/24/13 0453  TROPONINI 3.26* 3.42* 4.93*    BNP: No results found for this basename: PROBNP,  in the last 8760 hours  Coagulation: No results found for this basename: INR,  in the last 168 hours  ECG:   Medications:   Scheduled Medications: . antiseptic oral  rinse  15 mL Mouth Rinse TID AC & HS  . atorvastatin  40 mg Oral q1800  . diclofenac sodium  2 g Topical BID  . feeding supplement (ENSURE COMPLETE)  237 mL Oral BID BM  . feeding supplement (PRO-STAT SUGAR FREE 64)  30 mL Oral TID WC  . levalbuterol  0.63 mg Nebulization TID  . levofloxacin  750 mg Oral Q48H  . lisinopril  2.5 mg Oral Daily  . metoprolol succinate  25 mg Oral Daily  . pantoprazole  40 mg Oral Daily  . sodium chloride  10-40 mL Intracatheter Q12H     Infusions:     PRN Medications:  acetaminophen, acetaminophen, ALPRAZolam, guaiFENesin-dextromethorphan, HYDROcodone-acetaminophen, levalbuterol, morphine injection, ondansetron (ZOFRAN) IV, sodium chloride     Assessment/Plan   1. NSTEMI  - likely demand ischemia, managed conservatively given her multiple medical comorbidities  - not candidate for DAPT or anticoag given GI bleeds  - continue medical therapy   2.Atrial fib with RVR:  - on low dose Toprol XL with good rate control  -  previously deemed poor candidate for anticoag given age, fraility, and GI bleed   3. Systolic heart failure  - LVEF 20-25% by most recent echo, appears to be a new finding.  - continue medical therapy, given comorbidities and overall health status defer any ischemic evaluation at this time  - started low dose ACE-I - on my evaluation she appears to be hypovolemic at this time. Her documented oral intake is minimal, likely due to her mental status. Her BUN, Cr are trending up as well as the cell lines in her CBC to suggests she is becoming hemoconcentrated. Agree with gentle hydration    From cardiac standpoint no further testing or interventions planned at this time. Pending her discharge, she may follow up with [place] as an outpatient for further titration of her CHF medications. Will sign off of inpatient care. Please call with questions.         Carlyle Dolly, M.D., F.A.C.C.

## 2013-08-28 LAB — CBC
HEMATOCRIT: 31.8 % — AB (ref 36.0–46.0)
HEMOGLOBIN: 10.7 g/dL — AB (ref 12.0–15.0)
MCH: 32.5 pg (ref 26.0–34.0)
MCHC: 33.6 g/dL (ref 30.0–36.0)
MCV: 96.7 fL (ref 78.0–100.0)
Platelets: 85 10*3/uL — ABNORMAL LOW (ref 150–400)
RBC: 3.29 MIL/uL — ABNORMAL LOW (ref 3.87–5.11)
RDW: 17.9 % — ABNORMAL HIGH (ref 11.5–15.5)
WBC: 13.8 10*3/uL — ABNORMAL HIGH (ref 4.0–10.5)

## 2013-08-28 LAB — BASIC METABOLIC PANEL
BUN: 41 mg/dL — AB (ref 6–23)
CHLORIDE: 109 meq/L (ref 96–112)
CO2: 21 meq/L (ref 19–32)
Calcium: 8.3 mg/dL — ABNORMAL LOW (ref 8.4–10.5)
Creatinine, Ser: 0.9 mg/dL (ref 0.50–1.10)
GFR calc non Af Amer: 56 mL/min — ABNORMAL LOW (ref >90)
GFR, EST AFRICAN AMERICAN: 65 mL/min — AB (ref >90)
GLUCOSE: 86 mg/dL (ref 70–99)
POTASSIUM: 3.8 meq/L (ref 3.7–5.3)
Sodium: 140 mEq/L (ref 137–147)

## 2013-08-28 MED ORDER — CLOTRIMAZOLE 1 % EX CREA
TOPICAL_CREAM | Freq: Two times a day (BID) | CUTANEOUS | Status: DC
  Administered 2013-08-28 – 2013-08-30 (×5): via TOPICAL
  Filled 2013-08-28: qty 15

## 2013-08-28 MED ORDER — CLOTRIMAZOLE 1 % EX CREA
TOPICAL_CREAM | CUTANEOUS | Status: AC
  Filled 2013-08-28: qty 15

## 2013-08-28 NOTE — Progress Notes (Addendum)
PROGRESS NOTE    [name] BTD:[id number] DOB: Feb 17, 1926 DOA: 08/18/2013 PCP: [name], NP Primary cardiologist: Dr. [name]  HPI/Brief narrative 80 year old female with history of dementia, failure to thrive, PAF, hypothyroid, inclusion body myositis, anxiety and dysphagia was admitted on 08/18/13 with hematemesis, presumed septic shock secondary to HCAP, acute blood loss anemia, acute renal failure with hyperkalemia and A. fib with RVR. She was treated with IV fluids, broad-spectrum IV antibiotics, received 1 unit of PRBCs, PPIs, loaded with digoxin and reverted to sinus rhythm. After stabilization, she was transferred to telemetry floor.  Assessment/Plan:  1. Septic shock secondary to healthcare associated pneumonia: Initially admitted to step down unit. Central line placed by surgery. Treated with broad-spectrum IV antibiotics including cefepime and vancomycin. Hydrated with IV fluids. Shock has resolved. Transitioned to oral levofloxacin on 2/18 and completed total 10 days treatment. Unfortunately no blood or urine cultures were sent during admission. 2. Healthcare associated pneumonia versus aspiration pneumonia: Management as above. Continue dysphagia 1 diet. Urine Legionella and pneumococcal antigen negative. Completed Rx. 3. Atrial fibrillation with RVR: Cardiology was consulted. This was thought to be secondary to septic shock. Troponin was elevated-attributed to demand ischemia. TSH normal. Patient was loaded with digoxin and reverted to sinus rhythm. Patient is not an anticoagulation candidate secondary to frail status and bleeding risk. 4. NSTEMI, Type 2: Attributed to demand ischemia from septic shock. Cardiology following. Peak troponin 4.93. 2-D echo shows severely reduced LV systolic function with EF 20-25%. Not a good candidate for antiplatelet therapy given admission for hematemesis. Continue Toprol-XL and low dose ACE inhibitor. 5. Hematemesis/GI bleed: GI  consulted and performed EGD on 2/15 which did not demonstrate a bleeding lesion. Attributed to Mallory-Weiss tear. Continue PPI. 6. Acute blood loss anemia/thrombocytopenia: Status post 1 unit PRBCs. Platelet counts seem to have dropped in the last 2 days to 85. Follow CBCs. 7. Acute renal failure with hyperkalemia: Had resolved but creatinine had starting to creep up-possibly from dehydration. IV fluids and follow BMP-resolved. However at high risk for recurrent dehydration 8. Non-anion gap metabolic acidosis/ hypernatremia: The patient came in with bicarbonate of 19 but since then her bicarbonate had dropped to the 14-15 range. Likely secondary to dehydration-treated with hypotonic IV fluids and has resolved. 9. History of dementia: Pleasantly confused. 10. Failure to thrive: Continue nutritional supplements. PT and OT evaluation. Discussed extensively with patient's youngest son [name] on 2/19 and oldest son [name] on 2/20, updated care, answered questions-he states that patient has been bedbound at the nursing facility for the last couple of months, history of "mild dementia" but mental status worse in the hospital, patient declining to eat, moaning intermittently but denies pain or dyspnea. Anxiety +. Advised him that she has progressive failure to thrive from advanced age, dementia, several significant comorbidities and overall long-term prognosis is poor and family might want to discuss and consider more palliative oriented care. He was appreciative of the information and would discuss with his family. Awaiting input from family. 11. Dysphagia: Continue specialized diet as per speech therapy recommendations.   Code Status: DO NOT RESUSCITATE Family Communication: Discussed with son [name] on 2/19 & Mr. [name] in [place] on 2/20 Disposition Plan: Possibly DC to SNF soon   Consultants:  Cardiology  Gastroenterology  Procedures:  Right femoral CVL 2/15 > plan to  DC after placing peripheral line.  EGD 08/22/13:  Impression:  Small sliding hiatal hernia without evidence of erosive esophagitis.  No evidence of peptic ulcer disease.  Large post bulbar duodenal diverticulum without mucosal ulceration.  Incidental finding of small duodenal lipoma.  Comment;  No bleeding lesion identified.  She possibly had Mallory-Weiss tear.   Antibiotics:  IV cefepime 2/11 > 2/18  IV vancomycin 2/11 > 2/18  Oral levofloxacin 2/18 > DC'd  Subjective: Patient denies complaints. Denies pain or dyspnea.   Objective: Filed Vitals:   08/28/13 0636 08/28/13 0756 08/28/13 1314 08/28/13 1354  BP: 97/53   69/40  Pulse: 72   87  Temp: 97.4 F (36.3 C)   97.6 F (36.4 C)  TempSrc: Oral   Oral  Resp: 20   20  Height:      Weight:      SpO2: 100% 93% 96% 97%    Intake/Output Summary (Last 24 hours) at 08/28/13 1414 Last data filed at 08/28/13 0600  Gross per 24 hour  Intake    250 ml  Output      0 ml  Net    250 ml   Filed Weights   08/19/13 0500 08/20/13 0500 08/21/13 0600  Weight: 67.5 kg (148 lb 13 oz) 66.7 kg (147 lb 0.8 oz) 68.7 kg (151 lb 7.3 oz)     Exam:  General exam: Elderly, frail, cachectic and chronically ill looking female lying in bed. comfortable. Respiratory system: Reduced breath sounds bibasally with few basal crackles. Rest of lung fields clear to auscultation. No increased work of breathing. Cardiovascular system: S1 & S2 heard, RRR. No JVD, murmurs, gallops, clicks. 1+ bilateral leg edema. Telemetry: Sinus rhythm Gastrointestinal system: Abdomen is nondistended, soft and nontender. Normal bowel sounds heard. Central nervous system: Alert and oriented only to self and place. No focal neurological deficits. Extremities: Symmetric 5 x 5 power.    Data Reviewed: Basic Metabolic Panel:  Recent Labs Lab 08/23/13 0232 08/24/13 0453 08/25/13 0458 08/26/13 0712 08/28/13 0553  NA 143 145 148* 143 140  K 4.4 4.3 4.2 4.0 3.8    CL 113* 114* 117* 116* 109  CO2 15* 14* 15* 16* 21  GLUCOSE 99 124* 102* 110* 86  BUN 20 30* 42* 46* 41*  CREATININE 0.72 0.96 1.10 1.10 0.90  CALCIUM 8.4 8.6 8.7 8.2* 8.3*   Liver Function Tests: No results found for this basename: AST, ALT, ALKPHOS, BILITOT, PROT, ALBUMIN,  in the last 168 hours No results found for this basename: LIPASE, AMYLASE,  in the last 168 hours No results found for this basename: AMMONIA,  in the last 168 hours CBC:  Recent Labs Lab 08/22/13 0459 08/23/13 0232 08/24/13 0453 08/25/13 0458 08/28/13 0553  WBC  --   --  11.3* 12.6* 13.8*  HGB 10.2* 9.7* 10.5* 11.4* 10.7*  HCT 30.2* 28.5* 31.8* 34.3* 31.8*  MCV  --   --  98.5 97.7 96.7  PLT  --   --  141* 141* 85*   Cardiac Enzymes:  Recent Labs Lab 08/22/13 2050 08/23/13 0232 08/23/13 0812 08/24/13 0453  TROPONINI 2.44* 3.26* 3.42* 4.93*   BNP (last 3 results) No results found for this basename: PROBNP,  in the last 8760 hours CBG: No results found for this basename: GLUCAP,  in the last 168 hours  Recent Results (from the past 240 hour(s))  MRSA PCR SCREENING     Status: None   Collection Time    08/18/13  6:00 PM      Result Value Ref Range Status   MRSA by PCR NEGATIVE  NEGATIVE Final  Comment:            The GeneXpert MRSA Assay (FDA     approved for NASAL specimens     only), is one component of a     comprehensive MRSA colonization     surveillance program. It is not     intended to diagnose MRSA     infection nor to guide or     monitor treatment for     MRSA infections.      Additional labs: 1. Fasting lipids: Cholesterol 178, triglycerides 157, HDL 38, LDL 109 and VLDL 31 2. TSH: 1.185 3. Influenza panel PCR: Negative 4. Urine Legionella and pneumococcal antigen: Negative 5. 2-D echo 08/23/13: Study Conclusions  - Procedure narrative: Transthoracic echocardiography. Image quality was suboptimal. The study was technically difficult, as a result of poor patient  compliance and restricted patient mobility. - Left ventricle: The cavity size was normal. Wall thickness was increased in a pattern of moderate LVH. Systolic function was severely reduced. The estimated ejection fraction was in the range of 20% to 25%. Doppler parameters are consistent with abnormal left ventricular relaxation (grade 1 diastolic dysfunction). Doppler parameters are consistent with high ventricular filling pressure. - Regional wall motion abnormality: Akinesis of the mid-apical anterior, mid anteroseptal, apical inferior, apical septal, apical lateral, and apical myocardium; severe hypokinesis of the mid anterolateral myocardium; moderate hypokinesis of the mid inferoseptal and mid inferolateral myocardium. - Aortic valve: Mildly calcified annulus. Mildly thickened leaflets. Trivial regurgitation. - Mitral valve: Moderate mitral annular calcification. Mildly thickened leaflets . Mild regurgitation. - Left atrium: The atrium was mildly dilated. - Right ventricle: Systolic function was mildly reduced. - Tricuspid valve: Mild centrally directed regurgitation. - Pulmonary arteries: PA peak pressure: 70mm Hg (S). Mildly elevated pulmonary pressures. - Inferior vena cava: The vessel was dilated; the respirophasic diameter changes were blunted (< 50%); findings are consistent with elevated central venous pressure.       Studies: No results found.      Scheduled Meds: . alteplase  2 mg Intracatheter Once  . alteplase  2 mg Intracatheter Once  . antiseptic oral rinse  15 mL Mouth Rinse TID AC & HS  . atorvastatin  40 mg Oral q1800  . clotrimazole   Topical BID  . diclofenac sodium  2 g Topical BID  . feeding supplement (ENSURE COMPLETE)  237 mL Oral BID BM  . feeding supplement (PRO-STAT SUGAR FREE 64)  30 mL Oral TID WC  . levalbuterol  0.63 mg Nebulization TID  . lisinopril  2.5 mg Oral Daily  . metoprolol succinate  25 mg Oral Daily  . pantoprazole  40  mg Oral Daily  . sodium chloride  10-40 mL Intracatheter Q12H  . sodium chloride  10-40 mL Intracatheter Q12H   Continuous Infusions:    Principal Problem:   Septic shock Active Problems:   Atrial fibrillation with RVR   Upper GI bleed   Hematemesis   AKI (acute kidney injury)   Hyperkalemia   Elevated troponin   Protein-calorie malnutrition, severe   NSTEMI (non-ST elevated myocardial infarction)   Chronic systolic heart failure    Time spent: 20 minutes    Brantlee Penn, MD, FACP, FHM. [facility] Pager [phone]  If 7PM-7AM, please contact night-coverage [email or web address] Password [email or web address] 08/28/2013, 2:14 PM    LOS: 10 days

## 2013-08-29 DIAGNOSIS — D696 Thrombocytopenia, unspecified: Secondary | ICD-10-CM

## 2013-08-29 LAB — CBC
HCT: 30.1 % — ABNORMAL LOW (ref 36.0–46.0)
Hemoglobin: 10.2 g/dL — ABNORMAL LOW (ref 12.0–15.0)
MCH: 32.7 pg (ref 26.0–34.0)
MCHC: 33.9 g/dL (ref 30.0–36.0)
MCV: 96.5 fL (ref 78.0–100.0)
PLATELETS: 102 10*3/uL — AB (ref 150–400)
RBC: 3.12 MIL/uL — ABNORMAL LOW (ref 3.87–5.11)
RDW: 18 % — AB (ref 11.5–15.5)
WBC: 12.7 10*3/uL — ABNORMAL HIGH (ref 4.0–10.5)

## 2013-08-29 NOTE — Progress Notes (Signed)
PROGRESS NOTE    [name]:[id number] DOB: 1925-12-29 DOA: 08/18/2013 PCP: [name], NP Primary cardiologist: Dr. [name]  HPI/Brief narrative 80 year old female with history of dementia, failure to thrive, PAF, hypothyroid, inclusion body myositis, anxiety and dysphagia was admitted on 08/18/13 with hematemesis, presumed septic shock secondary to HCAP, acute blood loss anemia, acute renal failure with hyperkalemia and A. fib with RVR. She was treated with IV fluids, broad-spectrum IV antibiotics, received 1 unit of PRBCs, PPIs, loaded with digoxin and reverted to sinus rhythm. After stabilization, she was transferred to telemetry floor.  Assessment/Plan:  1. Septic shock secondary to healthcare associated pneumonia: Initially admitted to step down unit. Central line placed by surgery. Treated with broad-spectrum IV antibiotics including cefepime and vancomycin. Hydrated with IV fluids. Shock has resolved. Transitioned to oral levofloxacin on 2/18 and completed total 10 days treatment. Unfortunately no blood or urine cultures were sent during admission. 2. Healthcare associated pneumonia versus aspiration pneumonia: Management as above. Continue dysphagia 1 diet. Urine Legionella and pneumococcal antigen negative. Completed Rx. 3. Atrial fibrillation with RVR: Cardiology was consulted. This was thought to be secondary to septic shock. Troponin was elevated-attributed to demand ischemia. TSH normal. Patient was loaded with digoxin and reverted to sinus rhythm. Patient is not an anticoagulation candidate secondary to frail status and bleeding risk. 4. NSTEMI, Type 2: Attributed to demand ischemia from septic shock. Cardiology following. Peak troponin 4.93. 2-D echo shows severely reduced LV systolic function with EF 20-25%. Not a good candidate for antiplatelet therapy given admission for hematemesis. Continue Toprol-XL and low dose ACE inhibitor. 5. Hematemesis/GI bleed: GI  consulted and performed EGD on 2/15 which did not demonstrate a bleeding lesion. Attributed to Mallory-Weiss tear. Continue PPI. 6. Acute blood loss anemia/thrombocytopenia: Status post 1 unit PRBCs. Platelet counts seem to have dropped in the last 2 days to 85-improved 102. 7. Acute renal failure with hyperkalemia: Had resolved but creatinine had starting to creep up-possibly from dehydration. IV fluids and follow BMP-resolved. However at high risk for recurrent dehydration 8. Non-anion gap metabolic acidosis/ hypernatremia: The patient came in with bicarbonate of 19 but since then her bicarbonate had dropped to the 14-15 range. Likely secondary to dehydration-treated with hypotonic IV fluids and has resolved. 9. History of dementia: Pleasantly confused. 10. Failure to thrive: Continue nutritional supplements. PT and OT evaluation. Discussed extensively with patient's youngest son Mr. Ane Conerly on 2/19 and oldest son Mr. [name] on 2/20, updated care, answered questions-he states that patient has been bedbound at the nursing facility for the last couple of months, history of "mild dementia" but mental status worse in the hospital, patient declining to eat, moaning intermittently but denies pain or dyspnea. Anxiety +. Advised him that she has progressive failure to thrive from advanced age, dementia, several significant comorbidities and overall long-term prognosis is poor and family might want to discuss and consider more palliative oriented care. He was appreciative of the information and would discuss with his family. Awaiting input from family. 11. Dysphagia: Continue specialized diet as per speech therapy recommendations.   Code Status: DO NOT RESUSCITATE Family Communication: Discussed with son Mr. [name] on 2/19 & Mr. [name] in [place] on 2/20 Disposition Plan: Patient medically stable for DC to SNF-possibly  2/23   Consultants:  Cardiology  Gastroenterology  Procedures:  Right femoral CVL 2/15 > 2/22  EGD 08/22/13:  Impression:  Small sliding hiatal hernia without evidence of erosive esophagitis.  No evidence of peptic  ulcer disease.  Large post bulbar duodenal diverticulum without mucosal ulceration.  Incidental finding of small duodenal lipoma.  Comment;  No bleeding lesion identified.  She possibly had Mallory-Weiss tear.   Antibiotics:  IV cefepime 2/11 > 2/18  IV vancomycin 2/11 > 2/18  Oral levofloxacin 2/18 > DC'd  Subjective: Patient denies complaints. Denies pain or dyspnea.   Objective: Filed Vitals:   08/29/13 0449 08/29/13 0856 08/29/13 1350 08/29/13 1446  BP: 103/69 97/43 91/50    Pulse: 62 65 117   Temp: 98 F (36.7 C)  97.2 F (36.2 C)   TempSrc: Oral  Axillary   Resp: 20  20   Height:      Weight:      SpO2: 100%  99% 98%    Intake/Output Summary (Last 24 hours) at 08/29/13 1609 Last data filed at 08/29/13 7564  Gross per 24 hour  Intake    120 ml  Output      0 ml  Net    120 ml   Filed Weights   08/19/13 0500 08/20/13 0500 08/21/13 0600  Weight: 67.5 kg (148 lb 13 oz) 66.7 kg (147 lb 0.8 oz) 68.7 kg (151 lb 7.3 oz)     Exam:  General exam: Elderly, frail, cachectic and chronically ill looking female lying in bed. Pleasant and comfortable-this is the best that she is looked in the last 4-5 days caring for her. Respiratory system: Occasional basal crackles otherwise clear to auscultation. No increased work of breathing. Cardiovascular system: S1 & S2 heard, RRR. No JVD, murmurs, gallops, clicks. 1+ bilateral leg edema.  Gastrointestinal system: Abdomen is nondistended, soft and nontender. Normal bowel sounds heard. Central nervous system: Alert and oriented only to self and place. No focal neurological deficits. Extremities: Symmetric 5 x 5 power.    Data Reviewed: Basic Metabolic Panel:  Recent Labs Lab 08/23/13 0232  08/24/13 0453 08/25/13 0458 08/26/13 0712 08/28/13 0553  NA 143 145 148* 143 140  K 4.4 4.3 4.2 4.0 3.8  CL 113* 114* 117* 116* 109  CO2 15* 14* 15* 16* 21  GLUCOSE 99 124* 102* 110* 86  BUN 20 30* 42* 46* 41*  CREATININE 0.72 0.96 1.10 1.10 0.90  CALCIUM 8.4 8.6 8.7 8.2* 8.3*   Liver Function Tests: No results found for this basename: AST, ALT, ALKPHOS, BILITOT, PROT, ALBUMIN,  in the last 168 hours No results found for this basename: LIPASE, AMYLASE,  in the last 168 hours No results found for this basename: AMMONIA,  in the last 168 hours CBC:  Recent Labs Lab 08/23/13 0232 08/24/13 0453 08/25/13 0458 08/28/13 0553 08/29/13 0545  WBC  --  11.3* 12.6* 13.8* 12.7*  HGB 9.7* 10.5* 11.4* 10.7* 10.2*  HCT 28.5* 31.8* 34.3* 31.8* 30.1*  MCV  --  98.5 97.7 96.7 96.5  PLT  --  141* 141* 85* 102*   Cardiac Enzymes:  Recent Labs Lab 08/22/13 2050 08/23/13 0232 08/23/13 0812 08/24/13 0453  TROPONINI 2.44* 3.26* 3.42* 4.93*   BNP (last 3 results) No results found for this basename: PROBNP,  in the last 8760 hours CBG: No results found for this basename: GLUCAP,  in the last 168 hours  No results found for this or any previous visit (from the past 240 hour(s)).    Additional labs: 1. Fasting lipids: Cholesterol 178, triglycerides 157, HDL 38, LDL 109 and VLDL 31 2. TSH: 1.185 3. Influenza panel PCR: Negative 4. Urine Legionella and pneumococcal antigen: Negative 5. 2-D echo  08/23/13: Study Conclusions  - Procedure narrative: Transthoracic echocardiography. Image quality was suboptimal. The study was technically difficult, as a result of poor patient compliance and restricted patient mobility. - Left ventricle: The cavity size was normal. Wall thickness was increased in a pattern of moderate LVH. Systolic function was severely reduced. The estimated ejection fraction was in the range of 20% to 25%. Doppler parameters are consistent with abnormal left  ventricular relaxation (grade 1 diastolic dysfunction). Doppler parameters are consistent with high ventricular filling pressure. - Regional wall motion abnormality: Akinesis of the mid-apical anterior, mid anteroseptal, apical inferior, apical septal, apical lateral, and apical myocardium; severe hypokinesis of the mid anterolateral myocardium; moderate hypokinesis of the mid inferoseptal and mid inferolateral myocardium. - Aortic valve: Mildly calcified annulus. Mildly thickened leaflets. Trivial regurgitation. - Mitral valve: Moderate mitral annular calcification. Mildly thickened leaflets . Mild regurgitation. - Left atrium: The atrium was mildly dilated. - Right ventricle: Systolic function was mildly reduced. - Tricuspid valve: Mild centrally directed regurgitation. - Pulmonary arteries: PA peak pressure: 77mm Hg (S). Mildly elevated pulmonary pressures. - Inferior vena cava: The vessel was dilated; the respirophasic diameter changes were blunted (< 50%); findings are consistent with elevated central venous pressure.       Studies: No results found.      Scheduled Meds: . alteplase  2 mg Intracatheter Once  . alteplase  2 mg Intracatheter Once  . antiseptic oral rinse  15 mL Mouth Rinse TID AC & HS  . atorvastatin  40 mg Oral q1800  . clotrimazole   Topical BID  . diclofenac sodium  2 g Topical BID  . feeding supplement (ENSURE COMPLETE)  237 mL Oral BID BM  . feeding supplement (PRO-STAT SUGAR FREE 64)  30 mL Oral TID WC  . levalbuterol  0.63 mg Nebulization TID  . lisinopril  2.5 mg Oral Daily  . metoprolol succinate  25 mg Oral Daily  . pantoprazole  40 mg Oral Daily  . sodium chloride  10-40 mL Intracatheter Q12H  . sodium chloride  10-40 mL Intracatheter Q12H   Continuous Infusions:    Principal Problem:   Septic shock Active Problems:   Atrial fibrillation with RVR   Upper GI bleed   Hematemesis   AKI (acute kidney injury)   Hyperkalemia    Elevated troponin   Protein-calorie malnutrition, severe   NSTEMI (non-ST elevated myocardial infarction)   Chronic systolic heart failure    Time spent: 20 minutes    HONGALGI,ANAND, MD, FACP, FHM. [facility] Pager [phone]  If 7PM-7AM, please contact night-coverage [email or web address] Password [email or web address] 08/29/2013, 4:09 PM    LOS: 11 days

## 2013-08-29 NOTE — Progress Notes (Signed)
Right Femoral line removed, held pressure for 5 minutes, Tegaderm and folded 4X4 put on as pressure after cleansing of the area.

## 2013-08-29 NOTE — Progress Notes (Addendum)
PT appears much better resp status. Suggest repeat X-RAY to make sure lungs are clearing.

## 2013-08-30 ENCOUNTER — Telehealth: Payer: Self-pay | Admitting: Gastroenterology

## 2013-08-30 ENCOUNTER — Inpatient Hospital Stay (HOSPITAL_COMMUNITY): Payer: Medicare Other

## 2013-08-30 MED ORDER — ENSURE COMPLETE PO LIQD: 237.0000 mL | Freq: Two times a day (BID) | ORAL | Status: AC

## 2013-08-30 MED ORDER — ATORVASTATIN CALCIUM 40 MG PO TABS: 40.0000 mg | ORAL_TABLET | Freq: Every day | ORAL | Status: AC

## 2013-08-30 MED ORDER — LISINOPRIL 2.5 MG PO TABS: 2.5000 mg | ORAL_TABLET | Freq: Every day | ORAL | Status: AC

## 2013-08-30 MED ORDER — HYDROCODONE-ACETAMINOPHEN 5-325 MG PO TABS: 1.0000 | ORAL_TABLET | Freq: Two times a day (BID) | ORAL | Status: AC | PRN

## 2013-08-30 MED ORDER — ALPRAZOLAM 0.5 MG PO TABS: 0.2500 mg | ORAL_TABLET | Freq: Two times a day (BID) | ORAL | Status: AC | PRN

## 2013-08-30 MED ORDER — FLUCONAZOLE 100 MG PO TABS: 100.0000 mg | ORAL_TABLET | Freq: Every day | ORAL | Status: AC

## 2013-08-30 MED ORDER — ACETAMINOPHEN 500 MG PO TABS: 500.0000 mg | ORAL_TABLET | Freq: Three times a day (TID) | ORAL | Status: AC | PRN

## 2013-08-30 MED ORDER — FLUCONAZOLE 100 MG PO TABS
100.0000 mg | ORAL_TABLET | Freq: Every day | ORAL | Status: DC
  Administered 2013-08-30: 100 mg via ORAL
  Filled 2013-08-30: qty 1

## 2013-08-30 MED ORDER — METOPROLOL SUCCINATE ER 25 MG PO TB24: 25.0000 mg | ORAL_TABLET | Freq: Every day | ORAL | Status: AC

## 2013-08-30 NOTE — Progress Notes (Signed)
Speech Language Pathology Treatment:    Patient Details Name: [name] MRN: [id number] DOB: [birth year] Today's Date: 08/30/2013 Time:  - 10:30 AM  SLP spoke with pt at bedside who is much more alert this AM and verbally agrees to completing MBSS today. SLP will arrange now that pt able/willing to participate to help identify appropriate diet (for possible upgrade).                   Thank you,  Genene Churn, [place]  [place] 08/30/2013, 10:52 AM

## 2013-08-30 NOTE — Telephone Encounter (Signed)
Patient has appt with me on 2/24, which needs to be cancelled. Looks like it was made prior to her presentation to the hospital. She is a patient of Dr. Laural Golden, and he saw her in the office. Would recommend follow-up with Dr. Laural Golden or Karna Christmas in next 2 weeks.

## 2013-08-30 NOTE — Progress Notes (Signed)
Patient discharged to [name] skilled [facility] facility today,report called and given to [facility] LPN. Transported by EMS to awaiting facility.

## 2013-08-30 NOTE — Discharge Summary (Addendum)
Physician Discharge Summary  [name] [id number] DOB: 1926-05-19 DOA: 08/18/2013  PCP: [name], NP Primary Cardiologist: Dr. [name]  Admit date: 08/18/2013 Discharge date: 08/30/2013  Time spent: Greater than 30 minutes  Recommendations for Outpatient Follow-up:  1. MD at SNF in 3 days, to be seen with repeat labs (CBC & BMP) 2. Dr. [name], Cardiologist in 2 weeks. 3. Recommend followup chest x-ray in 4-6 weeks to ensure resolution of pneumonia.  Discharge Diagnoses:  Principal Problem:   Septic shock Active Problems:   Atrial fibrillation with RVR   Upper GI bleed   Hematemesis   AKI (acute kidney injury)   Hyperkalemia   Elevated troponin   Protein-calorie malnutrition, severe   NSTEMI (non-ST elevated myocardial infarction)   Chronic systolic heart failure   Discharge Condition: Improved & Stable  Diet recommendation: Dysphagia 1 and honey thickened liquids-to be adjusted further based on MBSS being done 08/30/13 and speech therapy recommendations.  Filed Weights   08/19/13 0500 08/20/13 0500 08/21/13 0600  Weight: 67.5 kg (148 lb 13 oz) 66.7 kg (147 lb 0.8 oz) 68.7 kg (151 lb 7.3 oz)    History of present illness:  80 year old female with history of dementia, failure to thrive, PAF, hypothyroid, inclusion body myositis, anxiety and dysphagia was admitted on 08/18/13 with hematemesis, presumed septic shock secondary to HCAP, acute blood loss anemia, acute renal failure with hyperkalemia and A. fib with RVR.  Hospital Course:   1. Septic shock secondary to healthcare associated pneumonia: Initially admitted to step down unit. Central line placed by surgery. Treated with broad-spectrum IV antibiotics including cefepime and vancomycin. Hydrated with IV fluids. Shock has resolved. Transitioned to oral levofloxacin on 2/18 and completed total 10 days treatment. Unfortunately no blood or urine cultures were sent during admission. 2. Healthcare  associated pneumonia versus aspiration pneumonia: Management as above. Urine Legionella and pneumococcal antigen negative. Completed Rx. Recommend followup chest x-ray in 4-6 weeks to ensure resolution of pneumonia. 3. Atrial fibrillation with RVR: Cardiology was consulted. This was thought to be secondary to septic shock. Troponin was elevated-attributed to demand ischemia. TSH normal. Patient was loaded with digoxin and reverted to sinus rhythm. Digoxin has been discontinued and currently rate controlled on metoprolol alone. Patient is not an anticoagulation candidate secondary to frail status and bleeding risk. 4. NSTEMI, Type 2: Attributed to demand ischemia from septic shock. Cardiology consulted. Peak troponin 4.93. 2-D echo shows severely reduced LV systolic function with EF 20-25%. Not a good candidate for antiplatelet therapy given admission for hematemesis. Continue Toprol-XL and low dose ACE inhibitor. 5. Acute systolic CHF/new cardiomyopathy: LVEF 20-25% by most recent echo appears to be a new finding. As per cardiology, continue medical therapy and given comorbidities and overall health status deferred any ischemic evaluation at this time. Started low dose ACE inhibitor. Currently of diuretics secondary to recent dehydration requiring IV fluids, poor oral intake and currently seems clinically euvolemic. May consider low dose diuretics as outpatient. Outpatient followup with cardiology. 6. Hematemesis/GI bleed: GI consulted and performed EGD on 2/15 which did not demonstrate a bleeding lesion. Attributed to Mallory-Weiss tear. Continue PPI. 7. Acute blood loss anemia/thrombocytopenia: Status post 1 unit PRBCs. Platelet counts seem to have dropped in the last 2 days to 85-improved 102. Follow CBC outpatient. 8. Acute renal failure with hyperkalemia: Had resolved but creatinine had starting to creep up-possibly from dehydration. IV fluids and follow BMP-resolved. However at high risk for recurrent  dehydration. Follow BMP in a few  days from discharge. 9. Non-anion gap metabolic acidosis/ hypernatremia: The patient came in with bicarbonate of 19 but since then her bicarbonate had dropped to the 14-15 range. Likely secondary to dehydration-treated with hypotonic IV fluids and has resolved. 10. History of dementia: Mental status has significantly improved post correction of her metabolic abnormalities. She is alert and oriented x2. 11. Failure to thrive: Continue nutritional supplements.Discussed extensively with patient's youngest son Mr. Janisa Labus on 2/19 and oldest son [name] on 2/20, updated care, answered questions-he states that patient has been bedbound at the nursing facility for the last couple of months, history of "mild dementia" but mental status worse in the hospital, patient declining to eat, moaning intermittently but denies pain or dyspnea. Anxiety +. Advised him that she has progressive failure to thrive from advanced age, dementia, several significant comorbidities and overall long-term prognosis is poor and family might want to discuss and consider more palliative oriented care. He was appreciative of the information and would discuss with his family. May consider palliative care consultation or hospice consultation at SNF. 12. Dysphagia: Patient was evaluated by speech therapy and placed on dysphagia 1 diet and honey thickened liquids. She is undergoing repeat MBSS by speech therapy on 2/23-further dietary recommendations to them. 13. Fungal infection of skin-sacral,perineal and perivaginal area: Treat with a week's course of oral fluconazole and monitor. 14. History of inclusion body myositis: Immunosuppressants had been held in the hospital. Will resume prednisone at discharge. Recommend reevaluation in the SNF regarding resuming azathioprine. 15. History of hypothyroidism: Continue Synthroid 16. Malnutrition: Continue dietary supplements. 17. Skin ulcer's: Patient has  superficial skin tears on the left forearm, left shin and an unstable left heel clean pressure ulcer. She also has stage II clean sacral decubitus ulcer. Continue wound care daily 18. DO NOT RESUSCITATE   Consultants:  Cardiology  Gastroenterology  Procedures:  Right femoral CVL 2/15 > 2/22  EGD 08/22/13:  Impression:  Small sliding hiatal hernia without evidence of erosive esophagitis.  No evidence of peptic ulcer disease.  Large post bulbar duodenal diverticulum without mucosal ulceration.  Incidental finding of small duodenal lipoma.  Comment;  No bleeding lesion identified.  She possibly had Mallory-Weiss tear.   Antibiotics:  IV cefepime 2/11 > 2/18  IV vancomycin 2/11 > 2/18  Oral levofloxacin 2/18 > DC'd    Discharge Exam:  Complaints: Patient denies complaints. Denies dyspnea or pain.  Filed Vitals:   08/29/13 1941 08/29/13 2044 08/30/13 0614 08/30/13 0714  BP:  92/42 110/48   Pulse:  68 68   Temp:  97.9 F (36.6 C) 98.4 F (36.9 C)   TempSrc:  Axillary    Resp:  20 20   Height:      Weight:      SpO2: 96% 100% 100% 98%    General exam: Elderly, frail, cachectic and chronically ill looking female lying in bed. Pleasant and comfortable-this is the best that she is looked in the last 4-5 days caring for her.  Respiratory system: Occasional basal crackles otherwise clear to auscultation. No increased work of breathing.  Cardiovascular system: S1 & S2 heard, RRR. No JVD, murmurs, gallops, clicks. 1+ bilateral leg edema.  Gastrointestinal system: Abdomen is nondistended, soft and nontender. Normal bowel sounds heard.  Central nervous system: Alert and oriented only to self and place. No focal neurological deficits.  Extremities: Symmetric 5 x 5 power Skin: Patient has superficial skin tears over left forearm, left shin and unstable clean left heel pressure  ulcer. She also has erythematous rash over her sacral/perineal and perivaginal region with satellite  lesions suspicious of fungal infection. Stage II clean sacral decubitus ulcer.   Discharge Instructions      Discharge Orders   Future Appointments Provider Department Dept Phone   08/31/2013 9:30 AM Orvil Feil, NP [facility] [phone]   09/14/2013 11:30 AM [facility] [place] [facility] [phone]   Liquids only 4 hours prior to your exam. Any medications can be taken as usual. Please arrive 15 min prior to your scheduled exam time.   09/14/2013 1:45 PM Mal Misty, MD [facility] [phone]   Future Orders Complete By Expires   ([place]) Call MD:  Anytime you have any of the following symptoms: 1) 3 pound weight gain in 24 hours or 5 pounds in 1 week 2) shortness of breath, with or without a dry hacking cough 3) swelling in the hands, feet or stomach 4) if you have to sleep on extra pillows at night in order to breathe.  As directed    Call MD for:  difficulty breathing, headache or visual disturbances  As directed    Call MD for:  extreme fatigue  As directed    Call MD for:  persistant dizziness or light-headedness  As directed    Call MD for:  persistant nausea and vomiting  As directed    Call MD for:  redness, tenderness, or signs of infection (pain, swelling, redness, odor or green/yellow discharge around incision site)  As directed    Call MD for:  severe uncontrolled pain  As directed    Call MD for:  temperature >100.4  As directed    Discharge instructions  As directed    Comments:     DIET: Dysphagia 1 diet & honey thickened liquids.   Increase activity slowly  As directed        Medication List    STOP taking these medications       azaTHIOprine 50 MG tablet  Commonly known as:  IMURAN     loperamide 2 MG capsule  Commonly known as:  IMODIUM     loratadine 10 MG tablet  Commonly known as:  CLARITIN     metoprolol tartrate 25 MG tablet  Commonly known as:   LOPRESSOR     mirtazapine 30 MG tablet  Commonly known as:  REMERON     nitrofurantoin (macrocrystal-monohydrate) 100 MG capsule  Commonly known as:  MACROBID     potassium chloride SA 20 MEQ tablet  Commonly known as:  K-DUR,KLOR-CON     sennosides-docusate sodium 8.6-50 MG tablet  Commonly known as:  SENOKOT-S      TAKE these medications       acetaminophen 500 MG tablet  Commonly known as:  TYLENOL  Take 1 tablet (500 mg total) by mouth every 8 (eight) hours as needed for mild pain or fever.     albuterol 108 (90 BASE) MCG/ACT inhaler  Commonly known as:  PROVENTIL HFA;VENTOLIN HFA  Inhale 2 puffs into the lungs every 6 (six) hours as needed. Shortness of breath     ALPRAZolam 0.5 MG tablet  Commonly known as:  XANAX  Take 0.5 tablets (0.25 mg total) by mouth 2 (two) times daily as needed for sleep or anxiety.     alum & mag hydroxide-simeth [id number] MG/5ML suspension  Commonly known as:  MAALOX PLUS  Take 5 mLs by mouth as  needed for indigestion.     atorvastatin 40 MG tablet  Commonly known as:  LIPITOR  Take 1 tablet (40 mg total) by mouth daily at 6 PM.     benzonatate 100 MG capsule  Commonly known as:  TESSALON  Take 100 mg by mouth every 8 (eight) hours as needed for cough.     docusate sodium 100 MG capsule  Commonly known as:  COLACE  Take 100 mg by mouth daily.     escitalopram 20 MG tablet  Commonly known as:  LEXAPRO  Take 20 mg by mouth daily.     feeding supplement (ENSURE COMPLETE) Liqd  Take 237 mLs by mouth 2 (two) times daily between meals.     feeding supplement (PRO-STAT SUGAR FREE 64) Liqd  Take 30 mLs by mouth 2 (two) times daily.     fluconazole 100 MG tablet  Commonly known as:  DIFLUCAN  Take 1 tablet (100 mg total) by mouth daily. Discontinue after September 05, 2013 dose.     folic acid 1 MG tablet  Commonly known as:  FOLVITE  Take 1 tablet (1 mg total) by mouth daily.     guaifenesin 100 MG/5ML syrup  Commonly known as:   ROBITUSSIN  Take 200 mg by mouth every 6 (six) hours as needed for cough or congestion.     guaiFENesin 600 MG 12 hr tablet  Commonly known as:  MUCINEX  Take 600 mg by mouth 2 (two) times daily.     HYDROcodone-acetaminophen 5-325 MG per tablet  Commonly known as:  NORCO/VICODIN  Take 1 tablet by mouth every 12 (twelve) hours as needed for moderate pain or severe pain.     ipratropium-albuterol 0.5-2.5 (3) MG/3ML Soln  Commonly known as:  DUONEB  Take 3 mLs by nebulization 3 (three) times daily.     levothyroxine 112 MCG tablet  Commonly known as:  SYNTHROID, LEVOTHROID  Take 112 mcg by mouth daily before breakfast.     lisinopril 2.5 MG tablet  Commonly known as:  PRINIVIL,ZESTRIL  Take 1 tablet (2.5 mg total) by mouth daily.     metoprolol succinate 25 MG 24 hr tablet  Commonly known as:  TOPROL-XL  Take 1 tablet (25 mg total) by mouth daily.     multivitamin with minerals Tabs tablet  Take 1 tablet by mouth daily.     omeprazole 40 MG capsule  Commonly known as:  PRILOSEC  Take 40 mg by mouth 2 (two) times daily.     ondansetron 4 MG tablet  Commonly known as:  ZOFRAN  Take 1 tablet (4 mg total) by mouth every 8 (eight) hours as needed for nausea.     oxybutynin 5 MG tablet  Commonly known as:  DITROPAN  Take 1 tablet (5 mg total) by mouth 4 (four) times daily.     polyethylene glycol packet  Commonly known as:  MIRALAX / GLYCOLAX  Take 17 g by mouth daily as needed for mild constipation.     predniSONE 10 MG tablet  Commonly known as:  DELTASONE  Take 1 tablet (10 mg total) by mouth daily with breakfast.     tamsulosin 0.4 MG Caps capsule  Commonly known as:  FLOMAX  Take 0.4 mg by mouth at bedtime.       Follow-up Information   Follow up with MD at [place]. Schedule an appointment as soon as possible for a visit in 3 days. (To be seen with repeat labs (CBC &  BMP))        The results of significant diagnostics from this  hospitalization (including imaging, microbiology, ancillary and laboratory) are listed below for reference.    Significant Diagnostic Studies: [place] Chest  08/19/2013   CLINICAL DATA:  Left pleural effusion question thoracentesis  EXAM: CHEST ULTRASOUND  COMPARISON:  Chest radiograph 08/18/2013  FINDINGS: Sonography of the left hemi thorax demonstrates presence of a very small left pleural effusion.  Volume of fluid is insufficient for thoracentesis.  IMPRESSION: Very small left pleural effusion, insufficient for thoracentesis.   Electronically Signed   By: Lavonia Dana M.D.   On: 08/19/2013 12:13   Dg Chest Port 1 View  08/30/2013   CLINICAL DATA:  80 year old female shortness of breath, sepsis. Small left pleural effusion. Initial encounter.  EXAM: PORTABLE CHEST - 1 VIEW  COMPARISON:  08/18/2013.  FINDINGS: Portable AP upright view at 0725 hrs. Stable lung volumes. Stable cardiac size and mediastinal contours. Visualized tracheal air column is within normal limits. No pneumothorax or pulmonary edema. Small left pleural effusion. Continued left lower lobe collapse or consolidation. No consolidation in the right lung.  IMPRESSION: Stable. Small left pleural effusion and left lower lobe collapse or consolidation.   Electronically Signed   By: Lars Pinks M.D.   On: 08/30/2013 07:39   Dg Chest Portable 1 View  08/18/2013   CLINICAL DATA:  Chest pain, hemoptysis  EXAM: PORTABLE CHEST - 1 VIEW  COMPARISON:  CT CHEST W/O CM dated 11/30/2012; DG CHEST 1V PORT dated 11/29/2012; DG ABD ACUTE W/CHEST dated 05/04/2012  FINDINGS: Heart size upper normal. Mild vascular congestion. Right lung is clear. On the left, there is hazy density over the lower lobe consistent with effusion and underlying consolidation.  IMPRESSION: Left-sided pleural effusion with lower lobe consolidation. More detailed evaluation with contrast-enhanced CT scan could be considered.   Electronically Signed   By: Skipper Cliche M.D.   On: 08/18/2013  16:49    Microbiology: No results found for this or any previous visit (from the past 240 hour(s)).   Labs: Basic Metabolic Panel:  Recent Labs Lab 08/24/13 0453 08/25/13 0458 08/26/13 0712 08/28/13 0553  NA 145 148* 143 140  K 4.3 4.2 4.0 3.8  CL 114* 117* 116* 109  CO2 14* 15* 16* 21  GLUCOSE 124* 102* 110* 86  BUN 30* 42* 46* 41*  CREATININE 0.96 1.10 1.10 0.90  CALCIUM 8.6 8.7 8.2* 8.3*   Liver Function Tests: No results found for this basename: AST, ALT, ALKPHOS, BILITOT, PROT, ALBUMIN,  in the last 168 hours No results found for this basename: LIPASE, AMYLASE,  in the last 168 hours No results found for this basename: AMMONIA,  in the last 168 hours CBC:  Recent Labs Lab 08/24/13 0453 08/25/13 0458 08/28/13 0553 08/29/13 0545  WBC 11.3* 12.6* 13.8* 12.7*  HGB 10.5* 11.4* 10.7* 10.2*  HCT 31.8* 34.3* 31.8* 30.1*  MCV 98.5 97.7 96.7 96.5  PLT 141* 141* 85* 102*   Cardiac Enzymes:  Recent Labs Lab 08/24/13 0453  TROPONINI 4.93*   BNP: BNP (last 3 results) No results found for this basename: PROBNP,  in the last 8760 hours CBG: No results found for this basename: GLUCAP,  in the last 168 hours  Additional labs: 1.  Fasting lipids: Cholesterol 178, triglycerides 157, HDL 38, LDL 109 and VLDL 31 2. TSH: 1.1852 3. Influenza panel PCR: Negative 4. Urine Legionella and streptococcal antigen: Negative 5. 2-D echo 08/23/13: Study Conclusions  -  Procedure narrative: Transthoracic echocardiography. Image quality was suboptimal. The study was technically difficult, as a result of poor patient compliance and restricted patient mobility. - Left ventricle: The cavity size was normal. Wall thickness was increased in a pattern of moderate LVH. Systolic function was severely reduced. The estimated ejection fraction was in the range of 20% to 25%. Doppler parameters are consistent with abnormal left ventricular relaxation (grade 1 diastolic dysfunction).  Doppler parameters are consistent with high ventricular filling pressure. - Regional wall motion abnormality: Akinesis of the mid-apical anterior, mid anteroseptal, apical inferior, apical septal, apical lateral, and apical myocardium; severe hypokinesis of the mid anterolateral myocardium; moderate hypokinesis of the mid inferoseptal and mid inferolateral myocardium. - Aortic valve: Mildly calcified annulus. Mildly thickened leaflets. Trivial regurgitation. - Mitral valve: Moderate mitral annular calcification. Mildly thickened leaflets . Mild regurgitation. - Left atrium: The atrium was mildly dilated. - Right ventricle: Systolic function was mildly reduced. - Tricuspid valve: Mild centrally directed regurgitation. - Pulmonary arteries: PA peak pressure: 10mm Hg (S). Mildly elevated pulmonary pressures. - Inferior vena cava: The vessel was dilated; the respirophasic diameter changes were blunted (< 50%); findings are consistent with elevated central venous pressure.     Signed:  Vernell Leep, MD, FACP, FHM. [facility] Pager [phone]  If 7PM-7AM, please contact night-coverage [email or web address] Password [facility] 08/30/2013, 11:56 AM

## 2013-08-30 NOTE — Procedures (Signed)
Objective Swallowing Evaluation: Modified Barium Swallowing Study   Patient Details  Name: [name] MRN: [id number] Date of Birth: [birth year]  Today's Date: 08/30/2013 Time: 5462-7035 SLP Time Calculation (min): 25 min  Past Medical History:  Past Medical History  Diagnosis Date  . Hypothyroidism   . PAF (paroxysmal atrial fibrillation)     Not a coumadin candidate  . Headache(784.0)   . Poor appetite   . UTI (urinary tract infection)   . Dysphagia     History of dysphagia 2, depressed by esophagus status post dilation  . Arthritis   . History of anxiety   . Orthostatic hypotension   . Inclusion body myositis   . Easy bruising   . Malnutrition/Cachexia     Pre-morbid body weight 150 lbs, 04/2012 <100 lbs   . Failure to thrive   . Macrocytosis   . Diarrhea    Past Surgical History:  Past Surgical History  Procedure Laterality Date  . Total hip arthroplasty  2000 or 2001    right  . Transthoracic echocardiogram  07/08/10    EF 55-60%  . Left total hip arthroplasty  1996  . Total hip revision  12/18/2011    Procedure: TOTAL HIP REVISION;  Surgeon: Gearlean Alf, MD;  Location: [facility];  Service: Orthopedics;  Laterality: Right;  Right Hip Revision to Constrained Liner-acetabular   . Flexible sigmoidoscopy  05/07/2012    Procedure: FLEXIBLE SIGMOIDOSCOPY;  Surgeon: Rogene Houston, MD;  Location: [facility];  Service: Endoscopy;  Laterality: N/A;  . Partial hysterectomy N/A     distant history, type unknown  . Esophagogastroduodenoscopy N/A 08/22/2013    [id number] sliding hiatal hernia without evidence of erosive esophagitis/Large post bulbar duodenal diverticulum without mucosal ulceration/Incidental finding of small duodenal lipoma   HPI:  [name] is an 80 y.o.female with known history of PAF (not on anticoagulation), severe hypothyroidism, last seen by Dr. Mare Ferrari in 2013 for pre-operative evaluation for right hip revision. She presented to the ER from  [name] with hematemesis, hypotension, and atrial fibrillation with RVR, rates in the 120's. Due to hypotension, (BP 77/47) she was not started on Cardizem. She was found to be anemic with Hgb 9.2. FOBT was found to be positive.She was found to be hyperkalemic with potassium of 5.0.  She is being treated for sepsis with IV fluids and antibiotics.      Assessment / Plan / Recommendation Clinical Impression  Dysphagia Diagnosis: Moderate oral phase dysphagia;Moderate pharyngeal phase dysphagia;Severe pharyngeal phase dysphagia;Moderate cervical esophageal phase dysphagia  Clinical impression: [name] was seen for MBSS today due to improved alertness and respiratory status. Unfortunately, results confirm high risk for aspiration despite diet modification due to significant weakness. Pt presents with moderate oral phase and mod/severe pharyngeal phase dysphagia characterized by poor labial seal, weak lingual movement with decreased anterior to posterior transit, delay in swallow initiation (5+ seconds despite verbal cues) with swallow trigger after spilling to pyriforms, decreased tongue base retraction, decrease hyolaryngeal excursion with overall decreased pharyngeal pressure resulting in gross penetration and trace aspiration of nectars with reduced ability to clear (weak cough) and significant pharyngeal residue (in valleculae, in pyriforms, along posterior pharyngeal wall) across all consistencies.   Pt's best performance was with honey-thick liquids (safest and least amount of residuals). Pt is at high risk for aspiration due to significant weakness. Chin tuck, effortful swallow, and head turn were all attempted however pt had difficulty with implementation and therefore ineffective. Recommend loose  puree (almost honey-like) and honey-thick liquids all via teaspoon presentation with encouragement for hard swallow and repeat swallows.  Pt unlikely able to meet current nutritional needs with the  small amount she has been consuming. Recommend high caloric honey-thick drinks offered throughout the day.     Treatment Recommendation  Patient unable to participate in swallow therapy at this time    Diet Recommendation Honey-thick liquid;Dysphagia 1 (Puree)   Liquid Administration via: Spoon Medication Administration: Crushed with puree Supervision: Staff to assist with self feeding;Full supervision/cueing for compensatory strategies Compensations: Slow rate;Small sips/bites;Multiple dry swallows after each bite/sip;Clear throat intermittently Postural Changes and/or Swallow Maneuvers: Seated upright 90 degrees;Out of bed for meals;Upright 30-60 min after meal    Other  Recommendations Oral Care Recommendations: Oral care Q4 per protocol;Staff/trained caregiver to provide oral care Other Recommendations: Order thickener from pharmacy;Prohibited food (jello, ice cream, thin soups);Clarify dietary restrictions   Follow Up Recommendations  24 hour supervision/assistance    Frequency and Duration  N/A          General Date of Onset: 08/20/13 HPI: [name] is an 80 y.o.female with known history of PAF (not on anticoagulation), severe hypothyroidism, last seen by Dr. Mare Ferrari in 2013 for pre-operative evaluation for right hip revision. She presented to the ER from [name] with hematemesis, hypotension, and atrial fibrillation with RVR, rates in the 120's. Due to hypotension, (BP 77/47) she was not started on Cardizem. She was found to be anemic with Hgb 9.2. FOBT was found to be positive.She was found to be hyperkalemic with potassium of 5.0.  She is being treated for sepsis with IV fluids and antibiotics.  Type of Study: Modified Barium Swallowing Study Reason for Referral: Objectively evaluate swallowing function Previous Swallow Assessment: MBSS 05/19/2012 Diet Prior to this Study: Dysphagia 1 (puree);Honey-thick liquids Temperature Spikes Noted: No Respiratory Status: Nasal  cannula History of Recent Intubation: No Behavior/Cognition: Cooperative;Alert Oral Cavity - Dentition: Poor condition Oral Motor / Sensory Function: Impaired motor;Impaired - see Bedside swallow eval;Impaired sensory (weakness) Oral impairment: Right lingual;Left lingual;Right labial;Left labial Self-Feeding Abilities: Total assist Patient Positioning: Upright in chair Baseline Vocal Quality: Hoarse;Low vocal intensity Volitional Cough: Weak;Congested Volitional Swallow: Unable to elicit Anatomy: Other (Comment) (osteophytes noted along posterior pharyngeal wall) Pharyngeal Secretions: Not observed secondary MBS    Reason for Referral Objectively evaluate swallowing function   Oral Phase Oral Preparation/Oral Phase Oral Phase: Impaired Oral - Honey Oral - Honey Teaspoon: Weak lingual manipulation;Incomplete tongue to palate contact;Reduced posterior propulsion;Lingual/palatal residue;Delayed oral transit (poor labial seal) Oral - Nectar Oral - Nectar Teaspoon: Weak lingual manipulation;Incomplete tongue to palate contact;Reduced posterior propulsion;Lingual/palatal residue;Delayed oral transit Oral - Solids Oral - Puree: Weak lingual manipulation;Incomplete tongue to palate contact;Reduced posterior propulsion;Lingual/palatal residue;Delayed oral transit Oral Phase - Comment Oral Phase - Comment: Weak labial seal around spoon, poor lingual movement despite cueing   Pharyngeal Phase Pharyngeal Phase Pharyngeal Phase: Impaired Pharyngeal - Honey Pharyngeal - Honey Teaspoon: Delayed swallow initiation;Premature spillage to valleculae;Premature spillage to pyriform sinuses;Reduced pharyngeal peristalsis;Reduced epiglottic inversion;Reduced anterior laryngeal mobility;Reduced laryngeal elevation;Reduced tongue base retraction;Penetration/Aspiration before swallow;Penetration/Aspiration during swallow;Pharyngeal residue - valleculae;Pharyngeal residue - pyriform sinuses;Pharyngeal residue -  posterior pharnyx;Lateral channel residue;Compensatory strategies attempted (Comment) (effortful swallow, chin tuck, head turn> all ineffective) Penetration/Aspiration details (honey teaspoon): Material enters airway, remains ABOVE vocal cords then ejected out;Material enters airway, remains ABOVE vocal cords and not ejected out Pharyngeal - Nectar Pharyngeal - Nectar Teaspoon: Delayed swallow initiation;Premature spillage to valleculae;Premature spillage to pyriform sinuses;Reduced pharyngeal peristalsis;Reduced  epiglottic inversion;Reduced anterior laryngeal mobility;Reduced laryngeal elevation;Reduced airway/laryngeal closure;Reduced tongue base retraction;Penetration/Aspiration before swallow;Penetration/Aspiration during swallow;Penetration/Aspiration after swallow;Trace aspiration;Pharyngeal residue - valleculae;Pharyngeal residue - pyriform sinuses;Pharyngeal residue - posterior pharnyx;Lateral channel residue;Pharyngeal residue - cp segment Penetration/Aspiration details (nectar teaspoon): Material enters airway, CONTACTS cords and not ejected out;Material enters airway, remains ABOVE vocal cords then ejected out Pharyngeal - Solids Pharyngeal - Puree: Delayed swallow initiation;Premature spillage to valleculae;Reduced pharyngeal peristalsis;Reduced epiglottic inversion;Reduced anterior laryngeal mobility;Reduced laryngeal elevation;Reduced tongue base retraction;Penetration/Aspiration before swallow;Pharyngeal residue - valleculae;Pharyngeal residue - posterior pharnyx;Pharyngeal residue - pyriform sinuses;Pharyngeal residue - cp segment Penetration/Aspiration details (puree): Material does not enter airway;Material enters airway, remains ABOVE vocal cords then ejected out Pharyngeal Phase - Comment Pharyngeal Comment: reduced pharyngeal pressure/weakness  Cervical Esophageal Phase    GO   Thank you,  Genene Churn, CCC-SLP [phone]  Cervical Esophageal Phase Cervical Esophageal  Phase: Impaired Cervical Esophageal Phase - Solids Puree: Reduced cricopharyngeal relaxation;Esophageal backflow into cervical esophagus Cervical Esophageal Phase - Comment Cervical Esophageal Comment: Poor clearance through UES across all consistencies, delayed emptying in proximal esophagus         Derry Arbogast 08/30/2013, 1:48 PM

## 2013-08-30 NOTE — Clinical Social Work Note (Signed)
Pt d/c today back to [name]. Pt's daughter and facility aware and agreeable. D/C summary faxed. Pt to transfer via [facility].  Benay Pike, [place]

## 2013-08-31 ENCOUNTER — Ambulatory Visit: Payer: Medicare Other | Admitting: Gastroenterology

## 2013-08-31 NOTE — Telephone Encounter (Signed)
Appt has been cancelled.  Routing to Dr. Olevia Perches office for follow-up appt.

## 2013-09-13 ENCOUNTER — Encounter: Payer: Self-pay | Admitting: Vascular Surgery

## 2013-09-13 NOTE — Telephone Encounter (Signed)
Butch Penny please make an appt for patient with [facility].

## 2013-09-14 ENCOUNTER — Ambulatory Visit: Payer: Medicare Other | Admitting: Vascular Surgery

## 2013-09-14 ENCOUNTER — Inpatient Hospital Stay: Admission: RE | Admit: 2013-09-14 | Payer: Medicare Other | Source: Ambulatory Visit

## 2013-09-16 ENCOUNTER — Ambulatory Visit: Payer: Medicare Other | Admitting: Gastroenterology

## 2013-09-16 NOTE — Telephone Encounter (Signed)
I called pt's son per son this appointment needs to be canceled he said pt is very sick, I called [name] to make pt's nurse Izora Gala aware of this appointment, Izora Gala at [place] stated to cancel appointment that pt is actively dying, I made nurse aware that this appointment is not until the 16th of march, Izora Gala stated to still cancel appointment, Izora Gala said they are canceling all of pt's appointments.

## 2013-09-16 NOTE — Telephone Encounter (Signed)
Pt scheduled with Dr. Olevia Perches office for 09/20/13 at 9:30am  [facility] called to make nursing home aware.  Routing to [employer]

## 2013-09-20 ENCOUNTER — Ambulatory Visit (INDEPENDENT_AMBULATORY_CARE_PROVIDER_SITE_OTHER): Payer: Medicare Other | Admitting: Internal Medicine

## 2013-09-20 ENCOUNTER — Ambulatory Visit: Payer: Medicare Other | Admitting: Gastroenterology

## 2013-10-06 DEATH — deceased

## 2015-06-21 IMAGING — US US CHEST/MEDIASTINUM
1 series · 5 of 5 positions shown · non-contrast
Comparison: Chest radiograph 08/18/2013

CLINICAL DATA: Left pleural effusion question thoracentesis

EXAM:
CHEST ULTRASOUND

[Series 1: us chest/mediastinum · 0.13mm/px · 5 of 5 slices shown]
[im 1/5]
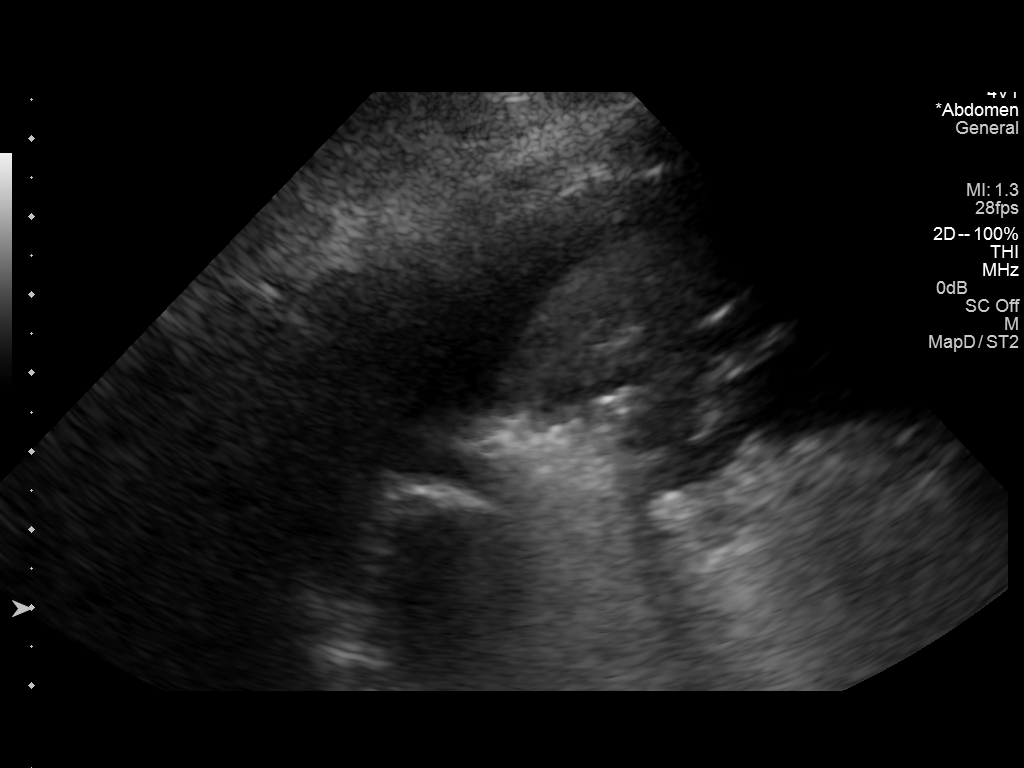
[im 2/5]
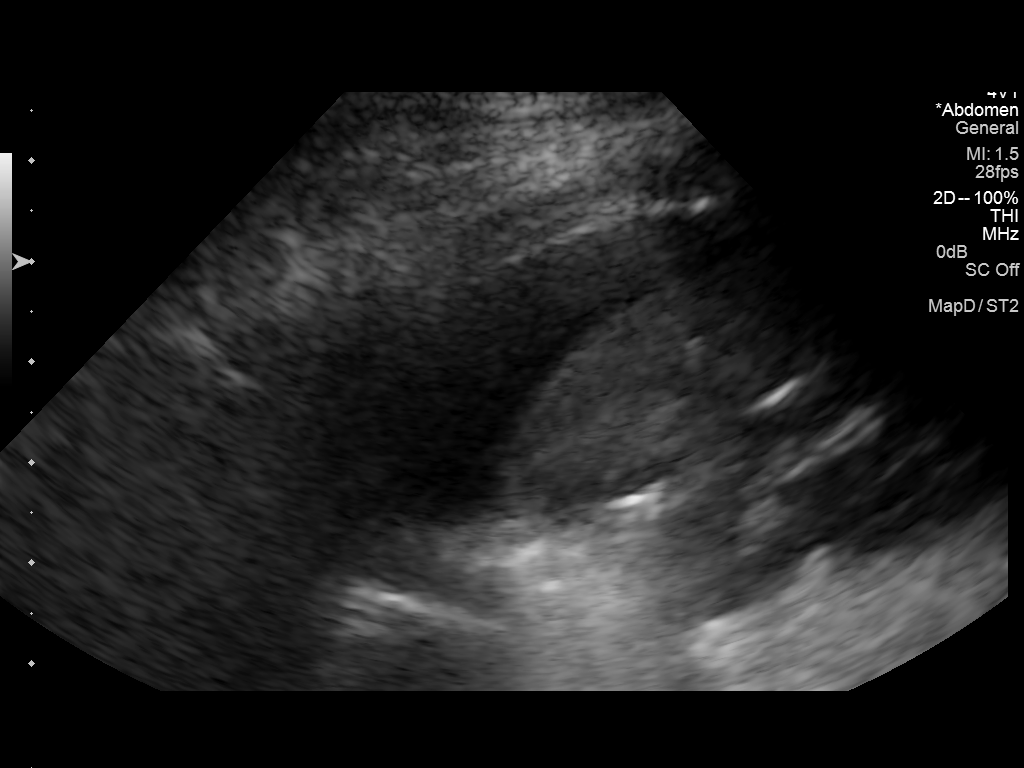
[im 3/5]
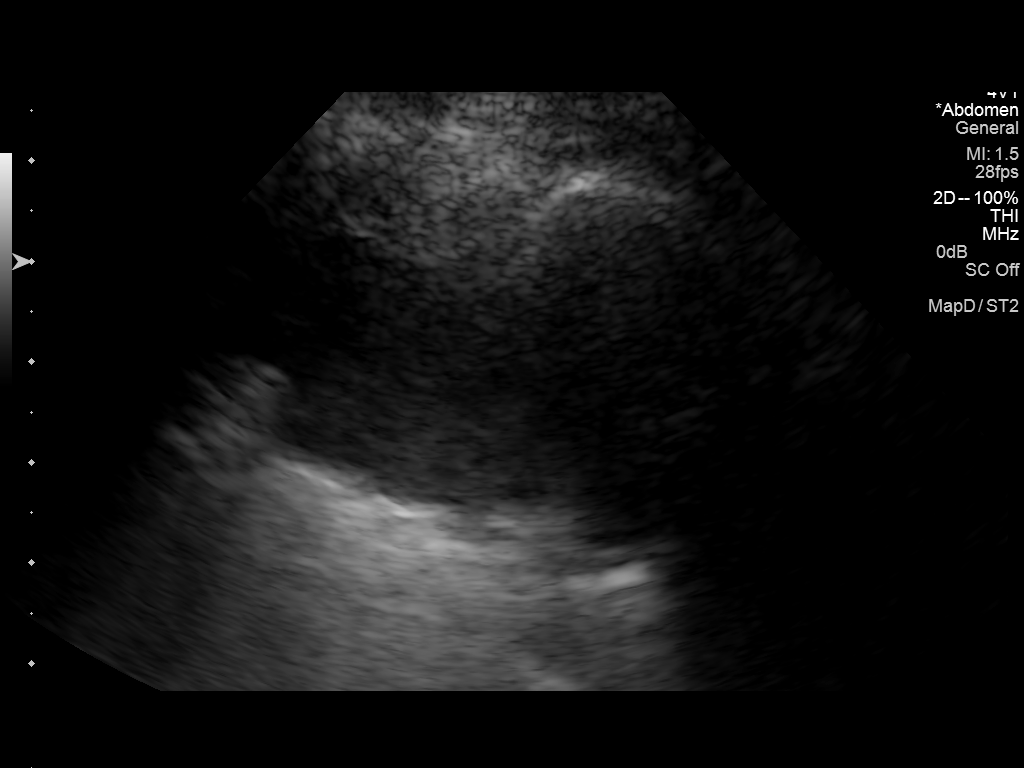
[im 4/5]
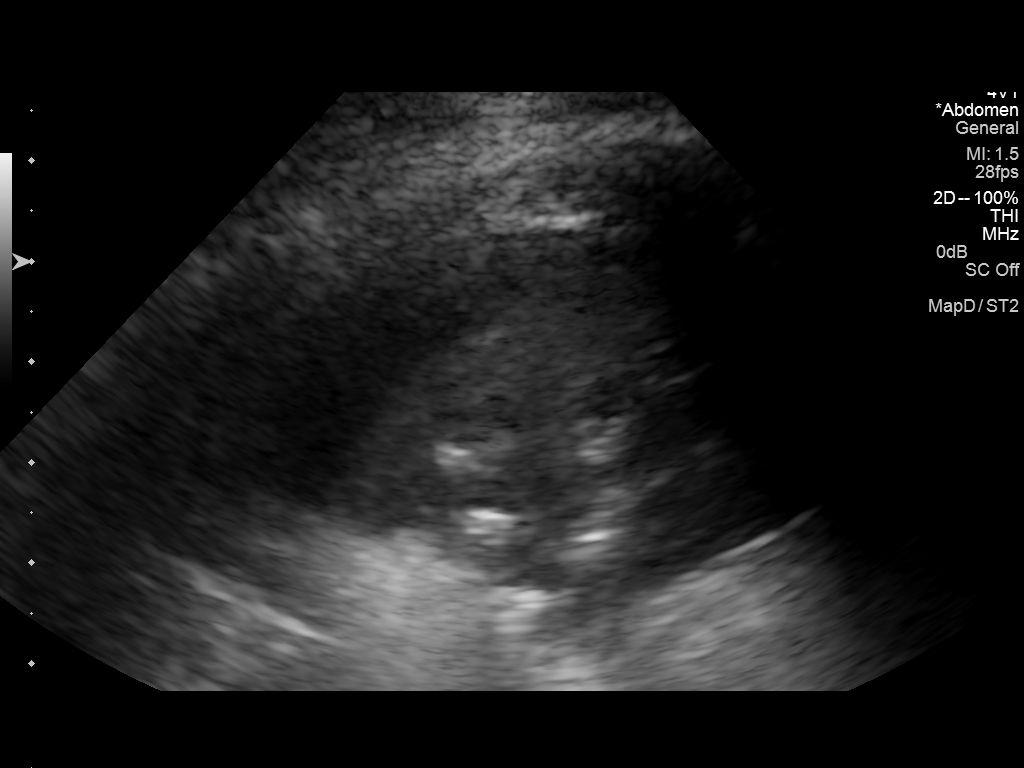
[im 5/5]
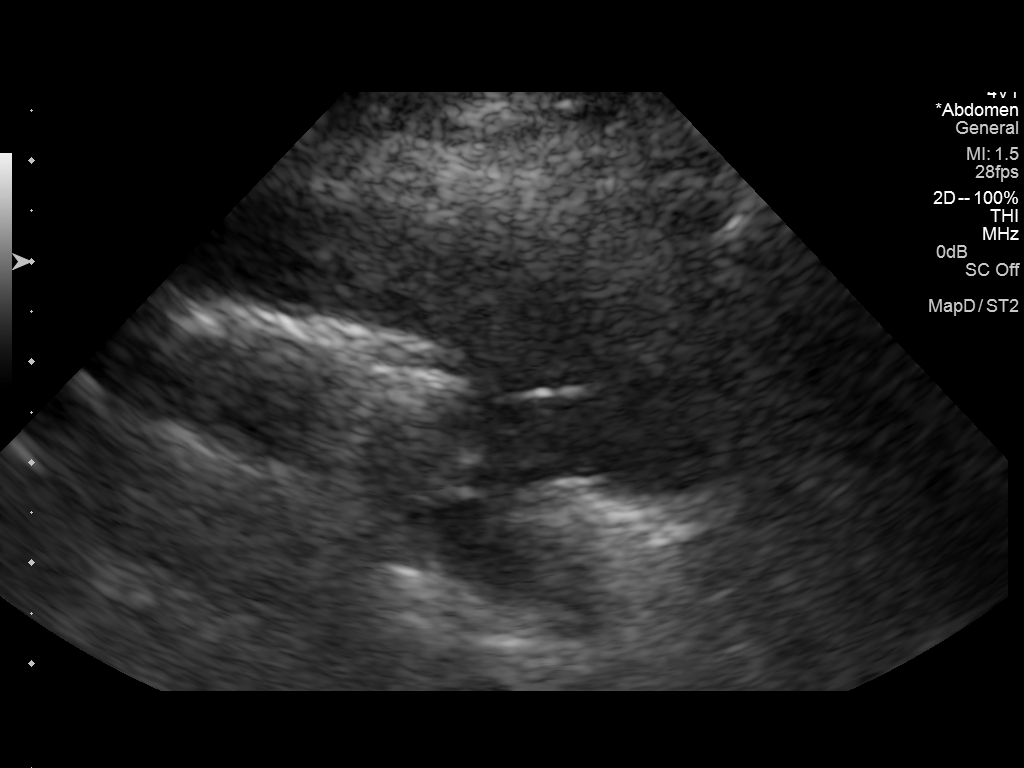

[5 of 5 positions shown; findings below may reference images not displayed]

FINDINGS: Sonography of the left hemi thorax demonstrates presence of a very
small left pleural effusion.

Volume of fluid is insufficient for thoracentesis.
IMPRESSION: Very small left pleural effusion, insufficient for thoracentesis.
